# Patient Record
Sex: Male | Born: 1950 | Race: Black or African American | Hispanic: No | Smoking: Current every day smoker
Health system: Southern US, Community
[De-identification: ages and names within clinical notes are randomized; demographics above are authoritative.]

## PROBLEM LIST (undated history)

## (undated) DIAGNOSIS — K5790 Diverticulosis of intestine, part unspecified, without perforation or abscess without bleeding: Secondary | ICD-10-CM

## (undated) DIAGNOSIS — K648 Other hemorrhoids: Secondary | ICD-10-CM

## (undated) DIAGNOSIS — I739 Peripheral vascular disease, unspecified: Secondary | ICD-10-CM

## (undated) DIAGNOSIS — I10 Essential (primary) hypertension: Secondary | ICD-10-CM

## (undated) DIAGNOSIS — E785 Hyperlipidemia, unspecified: Secondary | ICD-10-CM

## (undated) HISTORY — DX: Peripheral vascular disease, unspecified: I73.9

## (undated) HISTORY — DX: Diverticulosis of intestine, part unspecified, without perforation or abscess without bleeding: K57.90

## (undated) HISTORY — PX: INGUINAL HERNIA REPAIR: SUR1180

## (undated) HISTORY — DX: Other hemorrhoids: K64.8

---

## 2003-04-25 ENCOUNTER — Ambulatory Visit (HOSPITAL_COMMUNITY): Admission: RE | Admit: 2003-04-25 | Discharge: 2003-04-25 | Payer: Self-pay | Admitting: Orthopedic Surgery

## 2003-05-15 ENCOUNTER — Ambulatory Visit (HOSPITAL_COMMUNITY): Admission: RE | Admit: 2003-05-15 | Discharge: 2003-05-15 | Payer: Self-pay | Admitting: Vascular Surgery

## 2003-05-30 ENCOUNTER — Inpatient Hospital Stay (HOSPITAL_COMMUNITY): Admission: RE | Admit: 2003-05-30 | Discharge: 2003-06-04 | Payer: Self-pay | Admitting: Vascular Surgery

## 2003-07-09 ENCOUNTER — Encounter: Admission: RE | Admit: 2003-07-09 | Discharge: 2003-08-28 | Payer: Self-pay | Admitting: Vascular Surgery

## 2006-06-14 ENCOUNTER — Ambulatory Visit: Payer: Self-pay | Admitting: Vascular Surgery

## 2007-12-08 ENCOUNTER — Ambulatory Visit: Payer: Self-pay | Admitting: Vascular Surgery

## 2010-08-05 NOTE — Procedures (Signed)
BYPASS GRAFT EVALUATION   INDICATION:  Follow up right lower extremity bypass graft, left lower  extremity gets tired; however, no pain.   HISTORY:  Diabetes:  No.  Cardiac:  No.  Hypertension:  Yes.  Smoking:  Yes.  Previous Surgery:  Right femoral-popliteal artery bypass graft in March,  2005 by Dr. Darrick Penna.   SINGLE LEVEL ARTERIAL EXAM                               RIGHT              LEFT  Brachial:                    132                133  Anterior tibial:             142                64  Posterior tibial:            140                68  Peroneal:  Ankle/brachial index:        1.05               0.51   PREVIOUS ABI:  Date: 06/14/06  RIGHT:  0.97  LEFT:  0.51   LOWER EXTREMITY BYPASS GRAFT DUPLEX EXAM:   DUPLEX:  1. Doppler arterial waveforms appear biphasic proximal to, within, and      distal to bypass graft.  2. Stable elevated velocities at proximal and distal anastomoses.   IMPRESSION:  1. Stable bilateral ankle brachial indices.  2. Patent right femoral-popliteal artery bypass graft.  3. Stable elevated velocities noted at the proximal and distal      anastomosis.  4. No significant changes from the previous study.   ___________________________________________  Janetta Hora Fields, MD   AS/MEDQ  D:  12/08/2007  T:  12/08/2007  Job:  [id number]

## 2010-08-08 NOTE — Op Note (Signed)
NAME:  Adrian Phillips, Adrian Phillips                   ACCOUNT NO.:  [id number]   MEDICAL RECORD NO.:  [id number]                   PATIENT TYPE:  INP   LOCATION:  2033                                 FACILITY:  [facility]   PHYSICIAN:  Janetta Hora. Fields, MD               DATE OF BIRTH:  27-Jun-1950   DATE OF PROCEDURE:  05/30/2003  DATE OF DISCHARGE:                                 OPERATIVE REPORT   PROCEDURE:  Right femoral-popliteal bypass with non-reversed greater  saphenous vein.   PREOPERATIVE DIAGNOSIS:  Rest pain, right foot.   POSTOPERATIVE DIAGNOSIS:  Rest pain, right foot.   ANESTHESIA:  General.   ASSISTANT:  Toribio Harbour, N.P.   INDICATIONS:  The patient is a 60 year old male with a history of smoking.  He now presents with an ABI of 0.4 and rest pain.   FINDINGS:  1. Good quality greater saphenous vein with diameter 3 mm and greater.  2. Non-reversed saphenous vein.   OPERATIVE DETAILS:  After obtaining informed consent, the patient was taken  to the operating room.  The patient was placed in supine position on the  operating room table.  After induction of general anesthesia and  endotracheal intubation, both of the patient's lower extremities were  prepped and draped in the usual sterile fashion.  A Foley catheter was  placed.  Next, a longitudinal incision was made in the right groin over the  area of the common femoral artery.  The common femoral artery was dissected  free circumferentially.  The superficial femoral and profunda femoris  arteries were then also dissected free circumferentially and controlled with  vessel loops.  Next, the saphenofemoral junction was located in the medial  portion of the incision, and the greater saphenous vein was dissected free  through skip incisions down the leg to approximately 5 cm below the knee.  All side branches were ligated between silk ties.  Next, the incision along  the medial aspect of the leg for the greater  saphenous vein was deepened  down to the level of the popliteal fossa.  The popliteal artery was  dissected free circumferentially for approximately 4 cm below the knee and  controlled proximally and distally with vessel loops.  The popliteal artery  was soft in character.  Next, a tunnel was created medial to the head of the  gastrocnemius muscle and deep to the sartorius muscle, and the patient was  then given 5000 units of intravenous heparin.  The greater saphenous vein  was then harvested by clamping the saphenofemoral junction and oversewing  this with a 5-0 Prolene.  The distal end of the saphenous vein was clipped  twice as a marker for distal saphenous vein in the future.  After the vein  was harvested, it was flushed thoroughly with heparinized saline and  inspected.  Two small side branches were ligated with silk ties.  The  caliber of the vein at the saphenofemoral junction  was a better match to the  femoral artery with the distal end of the greater saphenous vein being 3 mm  in diameter, and the proximal end being approximately 5 mm in diameter.  Next, the common femoral artery was clamped with a small angled DeBakey  clamp, and the vessel loops were pulled taut at the profunda and superficial  femoral arteries.  A longitudinal arteriotomy was made just above the  bifurcation of these arteries.  The vein was spatulated, and an end-of-vein  to side-of-artery anastomosis was then constructed using a running 6-0  Prolene suture.  The clamps were removed, and the anastomosis tested and  found to be hemostatic.  The artery was flushed down through the vein graft.  A valvulotome was then brought up in the operative field, and all the valves  were lysed carefully, and there was excellent inflow through the vein graft.  Next, the vein was brought through the deep tunnel down to the level of the  below-knee popliteal artery.  The vessel loops were pulled taut, and a  longitudinal  arteriotomy was made in the popliteal artery, and the vein  slightly spatulated, and end-of-vein to side-of-popliteal artery anastomosis  constructed using a running 6-0 Prolene suture.  Just prior to completion of  the anastomosis, the usual flushing procedures were performed.  The  anastomosis was then secured and the clamps released, first releasing the  proximal popliteal artery and then last the distal popliteal artery.  There  was noted to be a pulse in the graft and the distal popliteal artery  immediately.  Hemostasis was obtained.  Doppler inspection of the foot  showed a biphasic dorsalis pedis and posterior tibial signal.  Next, a 21-  gauge butterfly needle was introduced into the proximal portion of the vein  graft and a completion arteriogram performed using inflow occlusion.  This  showed the anastomosis to be widely patent with two-vessel runoff to the  foot;  namely by the posterior tibial and peroneal arteries.  Next, all  incisions were closed with a deep layer of 2-0 Vicryl running suture.  The  deeper incisions were closed with a second layer. The groin was also closed  in three layers.  The skin of all incisions was then closed with staples.  The patient tolerated the procedure well, and there were no complications.  The instrument, sponge, and needle count was correct at the end of the case.  The patient was taken to the recovery room in stable condition.                                               Janetta Hora. Fields, MD    CEF/MEDQ  D:  06/02/2003  T:  06/02/2003  Job:  [id number]

## 2010-08-08 NOTE — H&P (Signed)
NAME:  GJON, LETARTE                   ACCOUNT NO.:  [id number]   MEDICAL RECORD NO.:  [id number]                   PATIENT TYPE:  INP   LOCATION:  NA                                   FACILITY:  [facility]   PHYSICIAN:  Janetta Hora. Fields, MD               DATE OF BIRTH:  11-Nov-1950   DATE OF ADMISSION:  05/30/2003  DATE OF DISCHARGE:                                HISTORY & PHYSICAL   REFERRING PHYSICIAN:  Dyke Brackett, M.D.   CHIEF COMPLAINT:  Peripheral vascular occlusive disease.   HISTORY OF PRESENT ILLNESS:  This is a 60 year old male with complaints of  numbness and tingling in his right foot.  The patient notices the symptoms  more prominently at night, however, he does experience them during the  daytime as well.  His symptoms are relieved by dangling his foot over the  bed or walking.  The patient denies any recent trauma.  The patient does  experience claudication symptoms in both legs at long distances, however,  the right is worse than the left.  He denies any history of diabetes  mellitus, hypertension, hyperlipidemia, coronary artery disease, COPD, and  CHF.  The patient denies any slow-healing ulcers in the bilateral lower  extremities.  The patient also denies any weight changes, dyspnea on  exertion, shortness of breath, angina, GI symptoms, as well as TIA and CVA  symptoms.  ABI's were performed on April 25, 2003, by Dr. Darrick Penna; 0.45 on  the right and normal on the left.  Duplex examination revealed severe  atherosclerotic occlusive disease of the right SFA.  An angiogram was  performed on May 15, 2003, which also showed right superficial femoral  artery occlusion with two vessel runoff.  The patient presented to the [facility]  office today for history and physical examination prior to right femoral to  popliteal bypass grafting performed by Dr. Darrick Penna.   PAST MEDICAL HISTORY:  Peripheral vascular occlusive disease.   PAST SURGICAL HISTORY:  None.   MEDICATIONS:  None.   ALLERGIES:  No known drug allergies.   REVIEW OF SYSTEMS:  Please see HPI for significant positives and negatives.   FAMILY HISTORY:  Mother with cerebrovascular accidents, hypertension,  diabetes mellitus, end-stage renal disease.  Father with myocardial  infarction.   SOCIAL HISTORY:  This is a married male with two children.  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  The patient denies any alcohol use and is currently trying  to stop smoking cigarettes.  He is smoking approximately two cigarettes per  day now after smoking one pack per day for 30 years.   PHYSICAL EXAMINATION:  VITAL SIGNS:  Blood pressure 158/80, pulse 64,  respirations 14.  GENERAL:  This is a 60 year old black male in no acute distress.  He is  alert and oriented x3.  HEENT:  Normocephalic and atraumatic.  Pupils equal, round, and reactive to  light and accommodation.  Extraocular  movements are intact.  There are no  cataracts, glaucoma, or macular degeneration.  NECK:  Supple with no JVD, no bruits, and no lymphadenopathy.  CHEST:  Symmetrical on inspiration.  LUNGS:  No wheezes, rhonchi, or rales.  HEART:  Regular rate and rhythm.  No murmurs, rubs, or gallops.  ABDOMEN:  Soft, nontender, and nondistended.  No masses or bruits.  Bowel  sounds are present in four quadrants.  GENITOURINARY:  RECTAL:  Deferred.  EXTREMITIES:  No cyanosis, clubbing, or edema or ulcerations.  Temperature  is warm.  Pulses; carotid 2+ bilaterally, femoral 2+ bilaterally, popliteal  1+ on the left, and the pedal pulses are not palpable on the left.  Popliteal and pedal pulses are not palpable on the right.  NEUROLOGY:  Nonfocal.  Gait is steady.  Deep tendon reflexes are 2+ and  muscle strength is 5/5.   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   PLAN:  Right femoral to popliteal bypass graft at [facility] on  May 30, 2003, by Dr. Darrick Penna.   Dr. Darrick Penna has seen and evaluated the patient  prior to this admission and  has explained the risks and benefits of the procedure and the patient has  agreed to continue.      Pecola Leisure, PA                      Charles E. Darrick Penna, MD    AY/MEDQ  D:  05/29/2003  T:  05/29/2003  Job:  [id number]

## 2010-08-08 NOTE — Discharge Summary (Signed)
NAME:  Adrian Phillips, Adrian Phillips                   ACCOUNT NO.:  [id number]   MEDICAL RECORD NO.:  [id number]                   PATIENT TYPE:  INP   LOCATION:  2033                                 FACILITY:  [facility]   PHYSICIAN:  Janetta Hora. Fields, MD               DATE OF BIRTH:  [birth year]   DATE OF ADMISSION:  05/30/2003  DATE OF DISCHARGE:  06/02/2003                                 DISCHARGE SUMMARY   ADMISSION DIAGNOSIS:  Peripheral vascular occlusive disease with right  superficial femoral artery occlusion.   DISCHARGE DIAGNOSIS:  Peripheral vascular disease with right superficial  femoral artery occlusion, status post right femoral to below the knee  popliteal bypass.   PAST MEDICAL HISTORY:  Unremarkable.   PAST SURGICAL HISTORY:  None.   ALLERGIES:  No known drug allergies.   HISTORY OF PRESENT ILLNESS:  Adrian Phillips is a 59 year old African  American man.  He presented with complaints of numbness and tingling in his  right foot.  These symptoms are more prominent at night, however, he did  also experience them during the day.  His symptoms were relieved by dangling  his foot over the bed or walking.  He was evaluated at the CVTS office by  Dr. Darrick Penna on February 2, 20005.  The evaluation included ABIs which  revealed 0.45 on the right and normal on the left.  Duplex examination  revealed severe atherosclerotic occlusive disease of the right SFA.  Dr.  Darrick Penna recommended preceding with angiogram to determine his suitability for  revascularization.  This was performed on May 15, 2003.  This confirmed  right superficial femoral artery occlusion with two vessel runoff.  Dr.  Darrick Penna then recommended surgical intervention for revascularization of his  right lower extremity.  The procedure risks and benefits were discussed with  Adrian Phillips and he agreed to proceed with surgery.   HOSPITAL COURSE:  On May 22, 2003, Adrian Phillips was luckily admitted to  [facility] under the care of Dr. Fabienne Bruns.  He  underwent the following surgical procedures.  1. Right femoral to below the knee popliteal bypass with a nonreversed     saphenous vein graft.  2. Intraoperative arteriogram.  Adrian Phillips tolerated this procedure well, transferring in stable  condition to the PACU.  He had easily dopplerable signals at the posterior  tibial and peroneal areas postoperatively.  He was extubated immediately  following surgery, awoke from anesthesia neurologically intact.  He remained  hemodynamically stable the immediate postoperative period.   Postoperative ABIs were performed on May 31, 2003, and they showed an  increase in his ABI on the right from 0.45 to 0.85.   Adrian Phillips postoperative course has been essentially uneventful,  although slowed slightly due to increased swelling in his right leg today,  postoperative day #2.  His vital signs have remained stable with blood  pressure 112/68.  His room air saturations 98%.  His heart is maintaining  normal sinus rhythm.  His lungs are clear.  He is tolerating his diet  without nausea.  His bowel and bladder functions are within normal limits  for him.  His right leg incisions are clean and dry.  The staples are  intact.  He does have some significant edema in his right lower extremity.  Also, some specific complaints of pain in his right calf this morning.  For  this reason, a venous Doppler study was performed June 01, 2003.  There was  no evidence of DVT, superficial thrombosis or Baker's cyst.  Adrian Phillips  will continue to keep his leg elevated over the next 12 to 24 hours, being  out of bed only to use the bathroom.  If his overall progress continues and  his leg swelling reduces significantly, he will be discharged home in the  next 24 to 48 hours.   LABORATORY DATA:  June 01, 2003, CBC with white blood cells 12.9,  hemoglobin 10.9, hematocrit 33.2, platelets 205.   Chemistries include sodium  136, potassium 4.0, BUN 5, creatinine 1.1, glucose 113.   CONDITION ON DISCHARGE:  Improved.   DISCHARGE INSTRUCTIONS:  1. Medications:     a. Nicotine patch 21 mg.  He is instructed to change that daily. He is to        wear that 21 mg patch for six weeks, reduce to the 14 mg patch for two        weeks, then 7 mg patch for two weeks.  He has been counseled regarding        the importance of smoking cessation and he agrees that it is time for        him to stop smoking cigarettes.     b. For pain management, he may have Tylox one to two p.o. q.4h. p.r.n.        for moderate to severe pain or Tylenol 325 mg one to two p.o. q.4h.        for mild pain.  2. Activity:  He has been asked to refrain from any driving or heavy lifting     and no standing for long periods of time.  He is to continue his     breathing exercises and daily walking.  He has also been reminded to keep     his right leg elevated as much as possible even after discharge from the     hospital.  3. His diet should be a heart healthy diet.  4. Wound care:  He is to shower daily.  If his incisions show any sign of     infection or if he has fever greater than 101 degrees F., he is to call     Dr. Darrick Penna' office.   FOLLOW UP:  1. He will have an appointment to see the CVTS registered nurse for skin     staple removal on Friday, June 08, 2003.  2. Dr. Darrick Penna would like to see him at the CVTS office on Friday, June 22, 2003.  The office will call with times for these appointments.      Adrian Phillips, N.P.                  Janetta Hora. Fields, MD    CTK/MEDQ  D:  06/01/2003  T:  06/04/2003  Job:  [id number]   cc:   Thera Flake., M.D.  [address]  [place]  [place] [address]  Fax: [phone]

## 2010-08-08 NOTE — Op Note (Signed)
NAME:  Adrian Phillips, Adrian Phillips                   ACCOUNT NO.:  [id number]   MEDICAL RECORD NO.:  [id number]                   PATIENT TYPE:  OIB   LOCATION:  2899                                 FACILITY:  [facility]   PHYSICIAN:  Janetta Hora. Fields, MD               DATE OF BIRTH:  [birth year]   DATE OF PROCEDURE:  05/15/2003  DATE OF DISCHARGE:                                 OPERATIVE REPORT   PROCEDURE PERFORMED:  Aortogram with bilateral lower extremity runoff.   PREOPERATIVE DIAGNOSIS:  Claudication of right foot with early rest pain.   POSTOPERATIVE DIAGNOSIS:  Claudication of right foot with early rest pain.   SURGEON:  Janetta Hora. Fields, MD   ANESTHESIA:  Local anesthetic.   ASSISTANT:  Nurse.   INDICATIONS FOR PROCEDURE:  The patient has a history of right calf  claudication. He has now progressed to early rest pain in the right foot.   DESCRIPTION OF PROCEDURE:  After obtaining informed consent, the patient  brought to the peripheral vascular lab.  The patient was placed in supine  position on the angio table.  Next, both groins were prepped and draped in  the usual sterile fashion.  Local anesthesia was infiltrated over the right  common femoral artery.  A Majestic needle was then used to cannulate the  right common femoral artery and a 0.035 J-tip guidewire introduced into the  right common femoral artery and abdominal aorta under fluoroscopic guidance  using a modified Seldinger technique.  Next a 5 French sheath was placed  over the guidewire into the right common femoral artery.  A 5 French pigtail  catheter was then placed over the guidewire into the abdominal aorta.  The  abdominal aortogram was then performed which shows bilateral patent single  renal arteries.  There is approximately 10% stenosis of the mid right renal  artery.  The abdominal aorta has mild atherosclerotic changes.  The right  and left common iliac arteries were widely patent.  The left and right  internal iliac arteries were patent.  There was mild narrowing of  approximately 50% of the left internal iliac artery.  The left and right  internal iliac arteries were patent bilaterally.  The right and left common  femoral arteries were widely patent.  There is a 60% narrowing of the middle  third of the right profunda femoris artery.  Otherwise, the origin of the  profunda bilaterally and a superficial femoral artery bilaterally was widely  patent.  On the left side, there is a 50% narrowing of the superficial  femoral artery midway through its course.  On the right side the superficial  femoral artery occludes near the level of the adductor hiatus.  The above-  knee popliteal artery then reconstitutes just above the femoral condyle.  The left superficial femoral artery was in continuity throughout its course.  On the left side there is a small anterior tibial artery which is heavily  diseased at its origin.  The tibial peroneal trunk on the left side has  atherosclerotic changes in the take off of the posterior tibial and peroneal  arteries.  These arteries do opacify well, though.  On the right side, the  anterior tibial artery is occluded.  The tibioperoneal trunk is widely  patent.  On the right side the dominant vessel to the foot is the posterior  tibial artery.  The peroneal artery is small and diminutive throughout its  course.  It does give out anterior and posterior communicating branches  which fill the dorsalis pedis artery distally.  On the left side there is  two vessel runoff to the foot via the peroneal and posterior tibial artery.  After the aortogram and lower extremity runoff was performed, the pigtail  catheter was removed over the guidewire and the 5 French sheath pulled and  hemostasis obtained with direct pressure.  The patient tolerated the  procedure well and there were no complications.  The patient was taken to  the recovery room in stable condition.    IMPRESSION:  Bilateral superficial femoral artery disease with occluded  right superficial femoral artery and bilateral tibial artery occlusive  disease with two vessel runoff to the foot bilaterally.                                               Janetta Hora. Fields, MD    CEF/MEDQ  D:  05/15/2003  T:  05/15/2003  Job:  [id number]

## 2019-05-20 ENCOUNTER — Other Ambulatory Visit: Payer: Self-pay

## 2019-05-20 DIAGNOSIS — Z20822 Contact with and (suspected) exposure to covid-19: Secondary | ICD-10-CM

## 2019-05-22 ENCOUNTER — Ambulatory Visit: Payer: Self-pay | Attending: Internal Medicine

## 2019-05-22 DIAGNOSIS — Z23 Encounter for immunization: Secondary | ICD-10-CM | POA: Insufficient documentation

## 2019-05-22 LAB — NOVEL CORONAVIRUS, NAA: SARS-CoV-2, NAA: NOT DETECTED

## 2019-05-22 NOTE — Progress Notes (Signed)
   Covid-19 Vaccination Clinic  Name:  JAHLEEL STROSCHEIN    MRN: [id number] DOB: [birth year]  05/22/2019  Mr. Morini was observed post Covid-19 immunization for 15 minutes without incidence. He was provided with Vaccine Information Sheet and instruction to access the V-Safe system.   Mr. Suess was instructed to call 911 with any severe reactions post vaccine: Marland Kitchen Difficulty breathing  . Swelling of your face and throat  . A fast heartbeat  . A bad rash all over your body  . Dizziness and weakness    Immunizations Administered    Name Date Dose VIS Date Route   Pfizer COVID-19 Vaccine 05/22/2019  3:41 PM 0.3 mL 03/03/2019 Intramuscular   Manufacturer: [employer], [employer]   Lot: [id number]   NDC: [address]-9

## 2019-06-09 ENCOUNTER — Telehealth: Payer: Self-pay | Admitting: Gastroenterology

## 2019-06-09 NOTE — Telephone Encounter (Signed)
TRIAGE PT FOR SCREENING COLONOSCOPY PRIOR TO MAY 8 IF POSSIBLE.

## 2019-06-20 ENCOUNTER — Ambulatory Visit: Payer: Self-pay | Attending: Internal Medicine

## 2019-06-20 ENCOUNTER — Other Ambulatory Visit: Payer: Self-pay

## 2019-06-20 ENCOUNTER — Ambulatory Visit: Payer: BC Managed Care – PPO

## 2019-06-20 DIAGNOSIS — Z23 Encounter for immunization: Secondary | ICD-10-CM

## 2019-06-20 NOTE — Progress Notes (Signed)
   Covid-19 Vaccination Clinic  Name:  Adrian Phillips    MRN: [id number] DOB: [birth year]  06/20/2019  Mr. Borunda was observed post Covid-19 immunization for 15 minutes without incident. He was provided with Vaccine Information Sheet and instruction to access the V-Safe system.   Mr. Basnett was instructed to call 911 with any severe reactions post vaccine: Marland Kitchen Difficulty breathing  . Swelling of face and throat  . A fast heartbeat  . A bad rash all over body  . Dizziness and weakness   Immunizations Administered    Name Date Dose VIS Date Route   Pfizer COVID-19 Vaccine 06/20/2019  3:26 PM 0.3 mL 03/03/2019 Intramuscular   Manufacturer: [employer], [employer]   Lot: [id number]   NDC: [address]-7

## 2019-06-20 NOTE — Telephone Encounter (Signed)
REVIEWED-NO ADDITIONAL RECOMMENDATIONS. 

## 2019-06-20 NOTE — Telephone Encounter (Signed)
Pt had nurse triage visit scheduled for today.  Called pt to triage him and he informed me that the [place] had reached out to him this morning and scheduled him for a TCS.  Cancelled our triage visit and informed pt that I would inform [facility].

## 2019-08-04 ENCOUNTER — Encounter: Payer: Self-pay | Admitting: Physician Assistant

## 2019-08-28 DIAGNOSIS — R195 Other fecal abnormalities: Secondary | ICD-10-CM | POA: Insufficient documentation

## 2019-08-28 DIAGNOSIS — I10 Essential (primary) hypertension: Secondary | ICD-10-CM | POA: Insufficient documentation

## 2019-08-29 ENCOUNTER — Ambulatory Visit: Payer: BC Managed Care – PPO | Admitting: Physician Assistant

## 2019-09-12 ENCOUNTER — Ambulatory Visit: Payer: BC Managed Care – PPO | Admitting: Physician Assistant

## 2019-10-16 ENCOUNTER — Encounter: Payer: Self-pay | Admitting: Nurse Practitioner

## 2019-10-17 ENCOUNTER — Ambulatory Visit: Payer: BC Managed Care – PPO | Admitting: Gastroenterology

## 2019-11-28 ENCOUNTER — Ambulatory Visit: Payer: BC Managed Care – PPO | Admitting: Nurse Practitioner

## 2019-12-05 ENCOUNTER — Other Ambulatory Visit: Payer: Self-pay

## 2019-12-05 ENCOUNTER — Emergency Department (HOSPITAL_COMMUNITY): Payer: BC Managed Care – PPO

## 2019-12-05 ENCOUNTER — Encounter (HOSPITAL_COMMUNITY): Payer: Self-pay | Admitting: Emergency Medicine

## 2019-12-05 ENCOUNTER — Emergency Department (HOSPITAL_COMMUNITY)
Admission: EM | Admit: 2019-12-05 | Discharge: 2019-12-05 | Disposition: A | Discharge status: Home / Self Care | Payer: BC Managed Care – PPO | Attending: Emergency Medicine | Admitting: Emergency Medicine

## 2019-12-05 DIAGNOSIS — Z5321 Procedure and treatment not carried out due to patient leaving prior to being seen by health care provider: Secondary | ICD-10-CM | POA: Insufficient documentation

## 2019-12-05 DIAGNOSIS — M7989 Other specified soft tissue disorders: Secondary | ICD-10-CM | POA: Diagnosis present

## 2019-12-05 HISTORY — DX: Essential (primary) hypertension: I10

## 2019-12-05 HISTORY — DX: Hyperlipidemia, unspecified: E78.5

## 2019-12-05 LAB — CBC
HCT: 44.5 % (ref 39.0–52.0)
Hemoglobin: 14.2 g/dL (ref 13.0–17.0)
MCH: 28.4 pg (ref 26.0–34.0)
MCHC: 31.9 g/dL (ref 30.0–36.0)
MCV: 89 fL (ref 80.0–100.0)
Platelets: 274 10*3/uL (ref 150–400)
RBC: 5 MIL/uL (ref 4.22–5.81)
RDW: 15.4 % (ref 11.5–15.5)
WBC: 6.5 10*3/uL (ref 4.0–10.5)
nRBC: 0 % (ref 0.0–0.2)

## 2019-12-05 LAB — BASIC METABOLIC PANEL
Anion gap: 11 (ref 5–15)
BUN: 6 mg/dL — ABNORMAL LOW (ref 8–23)
CO2: 23 mmol/L (ref 22–32)
Calcium: 8.8 mg/dL — ABNORMAL LOW (ref 8.9–10.3)
Chloride: 109 mmol/L (ref 98–111)
Creatinine, Ser: 0.84 mg/dL (ref 0.61–1.24)
GFR calc Af Amer: >60 mL/min (ref >60)
GFR calc non Af Amer: >60 mL/min (ref >60)
Glucose, Bld: 94 mg/dL (ref 70–99)
Potassium: 4.1 mmol/L (ref 3.5–5.1)
Sodium: 143 mmol/L (ref 135–145)

## 2019-12-05 LAB — TROPONIN I (HIGH SENSITIVITY): Troponin I (High Sensitivity): 11 ng/L (ref <18)

## 2019-12-05 NOTE — ED Triage Notes (Addendum)
Per pt, states both his ankle are swollen-non pitting-denies CP, SOB-no other symptoms-patient is currently taking Norvasc

## 2020-01-01 ENCOUNTER — Emergency Department (HOSPITAL_COMMUNITY): Payer: No Typology Code available for payment source

## 2020-01-01 ENCOUNTER — Encounter (HOSPITAL_COMMUNITY): Payer: Self-pay

## 2020-01-01 ENCOUNTER — Other Ambulatory Visit: Payer: Self-pay

## 2020-01-01 ENCOUNTER — Inpatient Hospital Stay (HOSPITAL_COMMUNITY): Payer: No Typology Code available for payment source

## 2020-01-01 ENCOUNTER — Inpatient Hospital Stay (HOSPITAL_COMMUNITY)
Admission: EM | Admit: 2020-01-01 | Discharge: 2020-01-22 | DRG: 215 | Disposition: E | Payer: No Typology Code available for payment source | Attending: Cardiothoracic Surgery | Admitting: Cardiothoracic Surgery

## 2020-01-01 ENCOUNTER — Ambulatory Visit: Payer: BC Managed Care – PPO | Admitting: Nurse Practitioner

## 2020-01-01 DIAGNOSIS — A419 Sepsis, unspecified organism: Secondary | ICD-10-CM | POA: Diagnosis not present

## 2020-01-01 DIAGNOSIS — E785 Hyperlipidemia, unspecified: Secondary | ICD-10-CM | POA: Diagnosis present

## 2020-01-01 DIAGNOSIS — J438 Other emphysema: Secondary | ICD-10-CM | POA: Diagnosis present

## 2020-01-01 DIAGNOSIS — Z87892 Personal history of anaphylaxis: Secondary | ICD-10-CM

## 2020-01-01 DIAGNOSIS — Z95811 Presence of heart assist device: Secondary | ICD-10-CM | POA: Diagnosis not present

## 2020-01-01 DIAGNOSIS — F419 Anxiety disorder, unspecified: Secondary | ICD-10-CM | POA: Diagnosis not present

## 2020-01-01 DIAGNOSIS — T8089XA Other complications following infusion, transfusion and therapeutic injection, initial encounter: Secondary | ICD-10-CM | POA: Diagnosis not present

## 2020-01-01 DIAGNOSIS — I5021 Acute systolic (congestive) heart failure: Secondary | ICD-10-CM

## 2020-01-01 DIAGNOSIS — L03114 Cellulitis of left upper limb: Secondary | ICD-10-CM | POA: Diagnosis not present

## 2020-01-01 DIAGNOSIS — I25110 Atherosclerotic heart disease of native coronary artery with unstable angina pectoris: Secondary | ICD-10-CM | POA: Diagnosis not present

## 2020-01-01 DIAGNOSIS — I110 Hypertensive heart disease with heart failure: Secondary | ICD-10-CM | POA: Diagnosis present

## 2020-01-01 DIAGNOSIS — I25750 Atherosclerosis of native coronary artery of transplanted heart with unstable angina: Secondary | ICD-10-CM

## 2020-01-01 DIAGNOSIS — J942 Hemothorax: Secondary | ICD-10-CM | POA: Diagnosis not present

## 2020-01-01 DIAGNOSIS — T8111XA Postprocedural  cardiogenic shock, initial encounter: Secondary | ICD-10-CM | POA: Diagnosis not present

## 2020-01-01 DIAGNOSIS — Z4682 Encounter for fitting and adjustment of non-vascular catheter: Secondary | ICD-10-CM

## 2020-01-01 DIAGNOSIS — I6521 Occlusion and stenosis of right carotid artery: Secondary | ICD-10-CM | POA: Diagnosis present

## 2020-01-01 DIAGNOSIS — I509 Heart failure, unspecified: Secondary | ICD-10-CM

## 2020-01-01 DIAGNOSIS — E874 Mixed disorder of acid-base balance: Secondary | ICD-10-CM | POA: Diagnosis not present

## 2020-01-01 DIAGNOSIS — I214 Non-ST elevation (NSTEMI) myocardial infarction: Secondary | ICD-10-CM | POA: Diagnosis present

## 2020-01-01 DIAGNOSIS — E861 Hypovolemia: Secondary | ICD-10-CM | POA: Diagnosis not present

## 2020-01-01 DIAGNOSIS — J432 Centrilobular emphysema: Secondary | ICD-10-CM | POA: Diagnosis present

## 2020-01-01 DIAGNOSIS — Y838 Other surgical procedures as the cause of abnormal reaction of the patient, or of later complication, without mention of misadventure at the time of the procedure: Secondary | ICD-10-CM | POA: Diagnosis not present

## 2020-01-01 DIAGNOSIS — R008 Other abnormalities of heart beat: Secondary | ICD-10-CM | POA: Diagnosis not present

## 2020-01-01 DIAGNOSIS — Y848 Other medical procedures as the cause of abnormal reaction of the patient, or of later complication, without mention of misadventure at the time of the procedure: Secondary | ICD-10-CM | POA: Diagnosis not present

## 2020-01-01 DIAGNOSIS — I428 Other cardiomyopathies: Secondary | ICD-10-CM | POA: Diagnosis present

## 2020-01-01 DIAGNOSIS — J810 Acute pulmonary edema: Secondary | ICD-10-CM

## 2020-01-01 DIAGNOSIS — I088 Other rheumatic multiple valve diseases: Secondary | ICD-10-CM | POA: Diagnosis present

## 2020-01-01 DIAGNOSIS — I2510 Atherosclerotic heart disease of native coronary artery without angina pectoris: Secondary | ICD-10-CM

## 2020-01-01 DIAGNOSIS — I739 Peripheral vascular disease, unspecified: Secondary | ICD-10-CM | POA: Diagnosis present

## 2020-01-01 DIAGNOSIS — I5043 Acute on chronic combined systolic (congestive) and diastolic (congestive) heart failure: Secondary | ICD-10-CM | POA: Diagnosis not present

## 2020-01-01 DIAGNOSIS — I161 Hypertensive emergency: Secondary | ICD-10-CM | POA: Diagnosis present

## 2020-01-01 DIAGNOSIS — J9601 Acute respiratory failure with hypoxia: Secondary | ICD-10-CM | POA: Diagnosis not present

## 2020-01-01 DIAGNOSIS — I340 Nonrheumatic mitral (valve) insufficiency: Secondary | ICD-10-CM

## 2020-01-01 DIAGNOSIS — K7200 Acute and subacute hepatic failure without coma: Secondary | ICD-10-CM | POA: Diagnosis not present

## 2020-01-01 DIAGNOSIS — Z79899 Other long term (current) drug therapy: Secondary | ICD-10-CM

## 2020-01-01 DIAGNOSIS — D689 Coagulation defect, unspecified: Secondary | ICD-10-CM | POA: Diagnosis not present

## 2020-01-01 DIAGNOSIS — Z01810 Encounter for preprocedural cardiovascular examination: Secondary | ICD-10-CM | POA: Diagnosis not present

## 2020-01-01 DIAGNOSIS — Z720 Tobacco use: Secondary | ICD-10-CM | POA: Diagnosis present

## 2020-01-01 DIAGNOSIS — Z515 Encounter for palliative care: Secondary | ICD-10-CM

## 2020-01-01 DIAGNOSIS — Z7982 Long term (current) use of aspirin: Secondary | ICD-10-CM

## 2020-01-01 DIAGNOSIS — F1010 Alcohol abuse, uncomplicated: Secondary | ICD-10-CM | POA: Diagnosis present

## 2020-01-01 DIAGNOSIS — Z419 Encounter for procedure for purposes other than remedying health state, unspecified: Secondary | ICD-10-CM

## 2020-01-01 DIAGNOSIS — I9789 Other postprocedural complications and disorders of the circulatory system, not elsewhere classified: Secondary | ICD-10-CM | POA: Diagnosis not present

## 2020-01-01 DIAGNOSIS — L8990 Pressure ulcer of unspecified site, unspecified stage: Secondary | ICD-10-CM | POA: Insufficient documentation

## 2020-01-01 DIAGNOSIS — R570 Cardiogenic shock: Secondary | ICD-10-CM | POA: Diagnosis not present

## 2020-01-01 DIAGNOSIS — I472 Ventricular tachycardia: Secondary | ICD-10-CM | POA: Diagnosis present

## 2020-01-01 DIAGNOSIS — I82890 Acute embolism and thrombosis of other specified veins: Secondary | ICD-10-CM | POA: Diagnosis not present

## 2020-01-01 DIAGNOSIS — D62 Acute posthemorrhagic anemia: Secondary | ICD-10-CM | POA: Diagnosis not present

## 2020-01-01 DIAGNOSIS — D696 Thrombocytopenia, unspecified: Secondary | ICD-10-CM | POA: Diagnosis not present

## 2020-01-01 DIAGNOSIS — I700 Atherosclerosis of aorta: Secondary | ICD-10-CM | POA: Diagnosis present

## 2020-01-01 DIAGNOSIS — E162 Hypoglycemia, unspecified: Secondary | ICD-10-CM | POA: Diagnosis not present

## 2020-01-01 DIAGNOSIS — I809 Phlebitis and thrombophlebitis of unspecified site: Secondary | ICD-10-CM | POA: Diagnosis not present

## 2020-01-01 DIAGNOSIS — N179 Acute kidney failure, unspecified: Secondary | ICD-10-CM | POA: Diagnosis not present

## 2020-01-01 DIAGNOSIS — Z7902 Long term (current) use of antithrombotics/antiplatelets: Secondary | ICD-10-CM

## 2020-01-01 DIAGNOSIS — I160 Hypertensive urgency: Secondary | ICD-10-CM | POA: Diagnosis present

## 2020-01-01 DIAGNOSIS — I469 Cardiac arrest, cause unspecified: Secondary | ICD-10-CM | POA: Diagnosis not present

## 2020-01-01 DIAGNOSIS — I462 Cardiac arrest due to underlying cardiac condition: Secondary | ICD-10-CM | POA: Diagnosis not present

## 2020-01-01 DIAGNOSIS — F17210 Nicotine dependence, cigarettes, uncomplicated: Secondary | ICD-10-CM | POA: Diagnosis present

## 2020-01-01 DIAGNOSIS — I493 Ventricular premature depolarization: Secondary | ICD-10-CM | POA: Diagnosis present

## 2020-01-01 DIAGNOSIS — Z66 Do not resuscitate: Secondary | ICD-10-CM | POA: Diagnosis not present

## 2020-01-01 DIAGNOSIS — Z9689 Presence of other specified functional implants: Secondary | ICD-10-CM

## 2020-01-01 DIAGNOSIS — Z951 Presence of aortocoronary bypass graft: Secondary | ICD-10-CM

## 2020-01-01 DIAGNOSIS — I255 Ischemic cardiomyopathy: Secondary | ICD-10-CM | POA: Diagnosis present

## 2020-01-01 DIAGNOSIS — J441 Chronic obstructive pulmonary disease with (acute) exacerbation: Secondary | ICD-10-CM | POA: Diagnosis not present

## 2020-01-01 DIAGNOSIS — Z20822 Contact with and (suspected) exposure to covid-19: Secondary | ICD-10-CM | POA: Diagnosis present

## 2020-01-01 DIAGNOSIS — Z01818 Encounter for other preprocedural examination: Secondary | ICD-10-CM

## 2020-01-01 DIAGNOSIS — R6521 Severe sepsis with septic shock: Secondary | ICD-10-CM | POA: Diagnosis not present

## 2020-01-01 DIAGNOSIS — E875 Hyperkalemia: Secondary | ICD-10-CM | POA: Diagnosis not present

## 2020-01-01 DIAGNOSIS — I2722 Pulmonary hypertension due to left heart disease: Secondary | ICD-10-CM | POA: Diagnosis present

## 2020-01-01 DIAGNOSIS — R739 Hyperglycemia, unspecified: Secondary | ICD-10-CM | POA: Diagnosis present

## 2020-01-01 DIAGNOSIS — I2584 Coronary atherosclerosis due to calcified coronary lesion: Secondary | ICD-10-CM | POA: Diagnosis present

## 2020-01-01 LAB — BASIC METABOLIC PANEL
Anion gap: 10 (ref 5–15)
Anion gap: 11 (ref 5–15)
BUN: 12 mg/dL (ref 8–23)
BUN: 18 mg/dL (ref 8–23)
CO2: 24 mmol/L (ref 22–32)
CO2: 26 mmol/L (ref 22–32)
Calcium: 8.9 mg/dL (ref 8.9–10.3)
Calcium: 9.2 mg/dL (ref 8.9–10.3)
Chloride: 106 mmol/L (ref 98–111)
Chloride: 101 mmol/L (ref 98–111)
Creatinine, Ser: 0.9 mg/dL (ref 0.61–1.24)
Creatinine, Ser: 1.14 mg/dL (ref 0.61–1.24)
GFR, Estimated: >60 mL/min (ref >60)
GFR, Estimated: >60 mL/min (ref >60)
Glucose, Bld: 174 mg/dL — ABNORMAL HIGH (ref 70–99)
Glucose, Bld: 157 mg/dL — ABNORMAL HIGH (ref 70–99)
Potassium: 3.5 mmol/L (ref 3.5–5.1)
Potassium: 4.2 mmol/L (ref 3.5–5.1)
Sodium: 140 mmol/L (ref 135–145)
Sodium: 138 mmol/L (ref 135–145)

## 2020-01-01 LAB — COMPREHENSIVE METABOLIC PANEL
ALT: 37 U/L (ref 0–44)
AST: 72 U/L — ABNORMAL HIGH (ref 15–41)
Albumin: 3.6 g/dL (ref 3.5–5.0)
Alkaline Phosphatase: 99 U/L (ref 38–126)
Anion gap: 10 (ref 5–15)
BUN: 15 mg/dL (ref 8–23)
CO2: 23 mmol/L (ref 22–32)
Calcium: 8.4 mg/dL — ABNORMAL LOW (ref 8.9–10.3)
Chloride: 107 mmol/L (ref 98–111)
Creatinine, Ser: 0.9 mg/dL (ref 0.61–1.24)
GFR, Estimated: >60 mL/min (ref >60)
Glucose, Bld: 136 mg/dL — ABNORMAL HIGH (ref 70–99)
Potassium: 5.1 mmol/L (ref 3.5–5.1)
Sodium: 140 mmol/L (ref 135–145)
Total Bilirubin: 0.5 mg/dL (ref 0.3–1.2)
Total Protein: 6.6 g/dL (ref 6.5–8.1)

## 2020-01-01 LAB — MAGNESIUM
Magnesium: 2.2 mg/dL (ref 1.7–2.4)
Magnesium: 2.2 mg/dL (ref 1.7–2.4)

## 2020-01-01 LAB — CBC WITH DIFFERENTIAL/PLATELET
Abs Immature Granulocytes: 0.09 10*3/uL — ABNORMAL HIGH (ref 0.00–0.07)
Abs Immature Granulocytes: 0.06 10*3/uL (ref 0.00–0.07)
Basophils Absolute: 0.1 10*3/uL (ref 0.0–0.1)
Basophils Absolute: 0 10*3/uL (ref 0.0–0.1)
Basophils Relative: 0 %
Basophils Relative: 0 %
Eosinophils Absolute: 0 10*3/uL (ref 0.0–0.5)
Eosinophils Absolute: 0 10*3/uL (ref 0.0–0.5)
Eosinophils Relative: 0 %
Eosinophils Relative: 0 %
HCT: 51.1 % (ref 39.0–52.0)
HCT: 42.2 % (ref 39.0–52.0)
Hemoglobin: 16 g/dL (ref 13.0–17.0)
Hemoglobin: 12.7 g/dL — ABNORMAL LOW (ref 13.0–17.0)
Immature Granulocytes: 1 %
Immature Granulocytes: 1 %
Lymphocytes Relative: 11 %
Lymphocytes Relative: 2 %
Lymphs Abs: 2.1 10*3/uL (ref 0.7–4.0)
Lymphs Abs: 0.3 10*3/uL — ABNORMAL LOW (ref 0.7–4.0)
MCH: 29.4 pg (ref 26.0–34.0)
MCH: 28.1 pg (ref 26.0–34.0)
MCHC: 31.3 g/dL (ref 30.0–36.0)
MCHC: 30.1 g/dL (ref 30.0–36.0)
MCV: 93.9 fL (ref 80.0–100.0)
MCV: 93.4 fL (ref 80.0–100.0)
Monocytes Absolute: 1.5 10*3/uL — ABNORMAL HIGH (ref 0.1–1.0)
Monocytes Absolute: 0.4 10*3/uL (ref 0.1–1.0)
Monocytes Relative: 8 %
Monocytes Relative: 3 %
Neutro Abs: 15.5 10*3/uL — ABNORMAL HIGH (ref 1.7–7.7)
Neutro Abs: 11.4 10*3/uL — ABNORMAL HIGH (ref 1.7–7.7)
Neutrophils Relative %: 80 %
Neutrophils Relative %: 94 %
Platelets: 273 10*3/uL (ref 150–400)
Platelets: 207 10*3/uL (ref 150–400)
RBC: 5.44 MIL/uL (ref 4.22–5.81)
RBC: 4.52 MIL/uL (ref 4.22–5.81)
RDW: 14.8 % (ref 11.5–15.5)
RDW: 14.6 % (ref 11.5–15.5)
WBC: 19.2 10*3/uL — ABNORMAL HIGH (ref 4.0–10.5)
WBC: 12.1 10*3/uL — ABNORMAL HIGH (ref 4.0–10.5)
nRBC: 0 % (ref 0.0–0.2)
nRBC: 0 % (ref 0.0–0.2)

## 2020-01-01 LAB — TSH: TSH: 0.56 u[IU]/mL (ref 0.350–4.500)

## 2020-01-01 LAB — RESPIRATORY PANEL BY RT PCR (FLU A&B, COVID)
Influenza A by PCR: NEGATIVE
Influenza B by PCR: NEGATIVE
SARS Coronavirus 2 by RT PCR: NEGATIVE

## 2020-01-01 LAB — LIPID PANEL
Cholesterol: 144 mg/dL (ref 0–200)
HDL: 82 mg/dL (ref >40)
LDL Cholesterol: 57 mg/dL (ref 0–99)
Total CHOL/HDL Ratio: 1.8 RATIO
Triglycerides: 26 mg/dL (ref <150)
VLDL: 5 mg/dL (ref 0–40)

## 2020-01-01 LAB — BRAIN NATRIURETIC PEPTIDE: B Natriuretic Peptide: 269.4 pg/mL — ABNORMAL HIGH (ref 0.0–100.0)

## 2020-01-01 LAB — ECHOCARDIOGRAM COMPLETE
Area-P 1/2: 3.91 cm2
Calc EF: 12.6 %
Height: 70 in
MV M vel: 4.33 m/s
MV Peak grad: 75 mmHg
Radius: 0.4 cm
S' Lateral: 5.1 cm
Single Plane A2C EF: 13.2 %
Single Plane A4C EF: 13.8 %
Weight: 2384 oz

## 2020-01-01 LAB — TROPONIN I (HIGH SENSITIVITY)
Troponin I (High Sensitivity): 1187 ng/L (ref <18)
Troponin I (High Sensitivity): 4615 ng/L (ref <18)

## 2020-01-01 LAB — RAPID URINE DRUG SCREEN, HOSP PERFORMED
Amphetamines: NOT DETECTED
Barbiturates: NOT DETECTED
Benzodiazepines: NOT DETECTED
Cocaine: NOT DETECTED
Opiates: NOT DETECTED
Tetrahydrocannabinol: NOT DETECTED

## 2020-01-01 LAB — D-DIMER, QUANTITATIVE: D-Dimer, Quant: 0.94 ug/mL-FEU — ABNORMAL HIGH (ref 0.00–0.50)

## 2020-01-01 LAB — HIV ANTIBODY (ROUTINE TESTING W REFLEX): HIV Screen 4th Generation wRfx: NONREACTIVE

## 2020-01-01 LAB — MRSA PCR SCREENING: MRSA by PCR: NEGATIVE

## 2020-01-01 LAB — HEMOGLOBIN A1C
Hgb A1c MFr Bld: 5.4 % (ref 4.8–5.6)
Mean Plasma Glucose: 108.28 mg/dL

## 2020-01-01 LAB — HEPARIN LEVEL (UNFRACTIONATED): Heparin Unfractionated: 0.1 IU/mL — ABNORMAL LOW (ref 0.30–0.70)

## 2020-01-01 MED ORDER — LORAZEPAM 1 MG PO TABS: 1.0000 mg | ORAL_TABLET | ORAL | Status: DC | PRN

## 2020-01-01 MED ORDER — ORAL CARE MOUTH RINSE
15.0000 mL | Freq: Two times a day (BID) | OROMUCOSAL | Status: DC
  Administered 2020-01-03 – 2020-01-07 (×6): 15 mL via OROMUCOSAL

## 2020-01-01 MED ORDER — SODIUM CHLORIDE 0.9 % IV SOLN
INTRAVENOUS | Status: DC | PRN
  Administered 2020-01-01: 250 mL via INTRAVENOUS

## 2020-01-01 MED ORDER — INFLUENZA VAC A&B SA ADJ QUAD 0.5 ML IM PRSY
0.5000 mL | PREFILLED_SYRINGE | INTRAMUSCULAR | Status: DC
  Filled 2020-01-01: qty 0.5

## 2020-01-01 MED ORDER — HEPARIN BOLUS VIA INFUSION
4000.0000 [IU] | Freq: Once | INTRAVENOUS | Status: AC
  Administered 2020-01-01: 4000 [IU] via INTRAVENOUS
  Filled 2020-01-01: qty 4000

## 2020-01-01 MED ORDER — HYDRALAZINE HCL 10 MG PO TABS: 10.0000 mg | ORAL_TABLET | Freq: Three times a day (TID) | ORAL | Status: DC

## 2020-01-01 MED ORDER — FUROSEMIDE 10 MG/ML IJ SOLN
80.0000 mg | Freq: Once | INTRAMUSCULAR | Status: AC
  Administered 2020-01-01: 80 mg via INTRAVENOUS
  Filled 2020-01-01: qty 8

## 2020-01-01 MED ORDER — NITROGLYCERIN IN D5W 200-5 MCG/ML-% IV SOLN
0.0000 ug/min | INTRAVENOUS | Status: DC
  Administered 2020-01-01: 5 ug/min via INTRAVENOUS
  Filled 2020-01-01: qty 250

## 2020-01-01 MED ORDER — LORAZEPAM 2 MG/ML IJ SOLN: 1.0000 mg | INTRAMUSCULAR | Status: DC | PRN

## 2020-01-01 MED ORDER — ENOXAPARIN SODIUM 40 MG/0.4ML ~~LOC~~ SOLN: 40.0000 mg | SUBCUTANEOUS | Status: DC

## 2020-01-01 MED ORDER — CHLORHEXIDINE GLUCONATE CLOTH 2 % EX PADS
6.0000 | MEDICATED_PAD | Freq: Every day | CUTANEOUS | Status: DC
  Administered 2020-01-01 – 2020-01-07 (×6): 6 via TOPICAL

## 2020-01-01 MED ORDER — CLOPIDOGREL BISULFATE 75 MG PO TABS
75.0000 mg | ORAL_TABLET | Freq: Every day | ORAL | Status: DC
  Administered 2020-01-01 – 2020-01-02 (×2): 75 mg via ORAL
  Filled 2020-01-01 (×3): qty 1

## 2020-01-01 MED ORDER — ASPIRIN 325 MG PO TABS
325.0000 mg | ORAL_TABLET | Freq: Once | ORAL | Status: AC
  Administered 2020-01-01: 325 mg via ORAL
  Filled 2020-01-01: qty 1

## 2020-01-01 MED ORDER — PERFLUTREN LIPID MICROSPHERE
1.0000 mL | INTRAVENOUS | Status: AC | PRN
  Administered 2020-01-01: 2 mL via INTRAVENOUS
  Filled 2020-01-01: qty 10

## 2020-01-01 MED ORDER — THIAMINE HCL 100 MG PO TABS
100.0000 mg | ORAL_TABLET | Freq: Every day | ORAL | Status: DC
  Administered 2020-01-01 – 2020-01-12 (×7): 100 mg via ORAL
  Filled 2020-01-01 (×9): qty 1

## 2020-01-01 MED ORDER — SODIUM CHLORIDE 0.9 % IV SOLN: INTRAVENOUS | Status: DC

## 2020-01-01 MED ORDER — ACETAMINOPHEN 650 MG RE SUPP: 650.0000 mg | Freq: Four times a day (QID) | RECTAL | Status: DC | PRN

## 2020-01-01 MED ORDER — ACETAMINOPHEN 325 MG PO TABS: 650.0000 mg | ORAL_TABLET | Freq: Four times a day (QID) | ORAL | Status: DC | PRN

## 2020-01-01 MED ORDER — AMLODIPINE BESYLATE 5 MG PO TABS: 10.0000 mg | ORAL_TABLET | Freq: Every day | ORAL | Status: DC

## 2020-01-01 MED ORDER — FUROSEMIDE 10 MG/ML IJ SOLN
40.0000 mg | Freq: Two times a day (BID) | INTRAMUSCULAR | Status: DC
  Administered 2020-01-01 – 2020-01-02 (×4): 40 mg via INTRAVENOUS
  Filled 2020-01-01 (×4): qty 4

## 2020-01-01 MED ORDER — ADULT MULTIVITAMIN W/MINERALS CH
1.0000 | ORAL_TABLET | Freq: Every day | ORAL | Status: DC
  Administered 2020-01-01 – 2020-01-07 (×6): 1 via ORAL
  Filled 2020-01-01 (×6): qty 1

## 2020-01-01 MED ORDER — SODIUM CHLORIDE 0.9 % IV SOLN: 250.0000 mL | INTRAVENOUS | Status: DC | PRN

## 2020-01-01 MED ORDER — CHLORHEXIDINE GLUCONATE 0.12% ORAL RINSE (MEDLINE KIT)
15.0000 mL | Freq: Two times a day (BID) | OROMUCOSAL | Status: DC
  Administered 2020-01-01 – 2020-01-07 (×10): 15 mL via OROMUCOSAL

## 2020-01-01 MED ORDER — SODIUM CHLORIDE 0.9% FLUSH
3.0000 mL | Freq: Two times a day (BID) | INTRAVENOUS | Status: DC
  Administered 2020-01-01 – 2020-01-07 (×6): 3 mL via INTRAVENOUS

## 2020-01-01 MED ORDER — LISINOPRIL 10 MG PO TABS: 10.0000 mg | ORAL_TABLET | Freq: Every day | ORAL | Status: DC

## 2020-01-01 MED ORDER — HEPARIN (PORCINE) 25000 UT/250ML-% IV SOLN
1000.0000 [IU]/h | INTRAVENOUS | Status: DC
  Administered 2020-01-01: 800 [IU]/h via INTRAVENOUS
  Filled 2020-01-01 (×2): qty 250

## 2020-01-01 MED ORDER — ASPIRIN EC 81 MG PO TBEC
81.0000 mg | DELAYED_RELEASE_TABLET | Freq: Every day | ORAL | Status: DC
  Administered 2020-01-03 – 2020-01-07 (×5): 81 mg via ORAL
  Filled 2020-01-01 (×6): qty 1

## 2020-01-01 MED ORDER — ATORVASTATIN CALCIUM 40 MG PO TABS: 40.0000 mg | ORAL_TABLET | Freq: Every day | ORAL | Status: DC

## 2020-01-01 MED ORDER — ATORVASTATIN CALCIUM 80 MG PO TABS
80.0000 mg | ORAL_TABLET | Freq: Every day | ORAL | Status: DC
  Administered 2020-01-01 – 2020-01-10 (×8): 80 mg via ORAL
  Filled 2020-01-01: qty 2
  Filled 2020-01-01 (×7): qty 1

## 2020-01-01 MED ORDER — THIAMINE HCL 100 MG/ML IJ SOLN
100.0000 mg | Freq: Every day | INTRAMUSCULAR | Status: DC
  Administered 2020-01-04 – 2020-01-09 (×2): 100 mg via INTRAVENOUS
  Filled 2020-01-01 (×4): qty 2

## 2020-01-01 MED ORDER — FOLIC ACID 1 MG PO TABS
1.0000 mg | ORAL_TABLET | Freq: Every day | ORAL | Status: DC
  Administered 2020-01-01 – 2020-01-07 (×6): 1 mg via ORAL
  Filled 2020-01-01 (×6): qty 1

## 2020-01-01 MED ORDER — ASPIRIN 81 MG PO CHEW
81.0000 mg | CHEWABLE_TABLET | ORAL | Status: AC
  Administered 2020-01-02: 81 mg via ORAL
  Filled 2020-01-01: qty 1

## 2020-01-01 MED ORDER — SODIUM CHLORIDE 0.9% FLUSH: 3.0000 mL | INTRAVENOUS | Status: DC | PRN

## 2020-01-01 MED ORDER — LORAZEPAM 2 MG/ML IJ SOLN: 0.5000 mg | Freq: Once | INTRAMUSCULAR | Status: DC

## 2020-01-01 MED ORDER — HEPARIN BOLUS VIA INFUSION
1700.0000 [IU] | Freq: Once | INTRAVENOUS | Status: AC
  Administered 2020-01-01: 1700 [IU] via INTRAVENOUS
  Filled 2020-01-01: qty 1700

## 2020-01-01 MED ORDER — SACUBITRIL-VALSARTAN 24-26 MG PO TABS
1.0000 | ORAL_TABLET | Freq: Two times a day (BID) | ORAL | Status: DC
  Administered 2020-01-01 (×2): 1 via ORAL
  Filled 2020-01-01 (×3): qty 1

## 2020-01-01 NOTE — ED Notes (Addendum)
Assumed care of pt at this time. Pt resting in stretcher,cont on bipap, tolerating well, no s/sx of acute distress at this time.

## 2020-01-01 NOTE — Progress Notes (Signed)
Patient had 23 beats of VTs. He's asymptomatic. Paged and reported it to [facility] NP and Brion Aliment NP and awaiting  for new directives. Will continue to monitor.

## 2020-01-01 NOTE — ED Notes (Signed)
Echo at bedside

## 2020-01-01 NOTE — Progress Notes (Signed)
[place] for IV heparin Indication: chest pain/ACS  Allergies  Allergen Reactions  . Lisinopril Swelling    Angioedema in 2020    Patient Measurements: Height: 5' 10.5" (179.1 cm) Weight: 57.7 kg (127 lb 3.3 oz) IBW/kg (Calculated) : 74.15 Heparin Dosing Weight: TBW  Vital Signs: Temp: 98.1 F (36.7 C) (10/11 1603) Temp Source: Oral (10/11 1603) BP: 121/74 (10/11 1800) Pulse Rate: 100 (10/11 1900)  Labs: Recent Labs    12/31/2019 0151 01/17/2020 0552 01/07/2020 0835 12/29/2019 1236 12/30/2019 1817  HGB 16.0 12.7*  --   --   --   HCT 51.1 42.2  --   --   --   PLT 273 207  --   --   --   HEPARINUNFRC  --   --   --   --  0.10*  CREATININE 0.90 0.90  --  1.14  --   TROPONINIHS  --  1,187* 4,615*  --   --     Estimated Creatinine Clearance: 49.9 mL/min (by C-G formula based on SCr of 1.14 mg/dL).   Medical History: Past Medical History:  Diagnosis Date  . Diverticulosis   . Hyperlipidemia   . Hypertension   . Internal hemorrhoids   . PVD (peripheral vascular disease) (HCC)     Medications:  Medications Prior to Admission  Medication Sig Dispense Refill Last Dose  . amLODipine (NORVASC) 10 MG tablet Take 10 mg by mouth daily.   12/31/2019 at Unknown time  . aspirin 325 MG tablet Take 325 mg by mouth daily.   12/31/2019 at Unknown time  . atorvastatin (LIPITOR) 80 MG tablet Take 40 mg by mouth at bedtime.    12/30/2019 at Unknown time  . Cholecalciferol 50 MCG (2000 UT) TABS Take 4,000 Units by mouth daily.   12/31/2019 at Unknown time  . clopidogrel (PLAVIX) 75 MG tablet Take 75 mg by mouth at bedtime.    12/30/2019  . tadalafil (CIALIS) 10 MG tablet Take 5 mg by mouth daily as needed for erectile dysfunction.   > 1 month   Scheduled:  . [START ON 01/12/2020] aspirin  81 mg Oral Pre-Cath  . [START ON 01/07/2020] aspirin EC  81 mg Oral Daily  . atorvastatin  80 mg Oral Daily  . chlorhexidine gluconate (MEDLINE KIT)  15 mL Mouth  Rinse BID  . Chlorhexidine Gluconate Cloth  6 each Topical Daily  . clopidogrel  75 mg Oral Daily  . folic acid  1 mg Oral Daily  . furosemide  40 mg Intravenous Q12H  . [START ON 01/10/2020] influenza vaccine adjuvanted  0.5 mL Intramuscular Tomorrow-1000  . mouth rinse  15 mL Mouth Rinse q12n4p  . multivitamin with minerals  1 tablet Oral Daily  . sacubitril-valsartan  1 tablet Oral BID  . sodium chloride flush  3 mL Intravenous Q12H  . thiamine  100 mg Oral Daily   Or  . thiamine  100 mg Intravenous Daily   Infusions:  . sodium chloride    . [START ON 01/10/2020] sodium chloride    . sodium chloride 250 mL (01/10/2020 1049)  . heparin 800 Units/hr (01/04/2020 1039)  . nitroGLYCERIN Stopped (01/15/2020 6761)    Assessment: 59 yoM with no prior cardiac history but PMH HTN/HLD, PAD, tobacco & EtOH, admitted for acute SOB. Found to have NSTEMI with acutely reduced LVEF. Pharmacy to dose IV heparin.   Baseline INR, aPTT: not done  Prior anticoagulation: none  Significant events:  Today,  01/09/2020 7:02 PM:   HL 0.1 sub-therapeutic  Infusion not paused per RN and no bleeding reported  CBC: Hgb slightly low; Plt WNL  Trop elevated and increasing  SCr WNL, baseline  Goal of Therapy: Heparin level 0.3-0.7 units/ml Monitor platelets by anticoagulation protocol: Yes  Plan:  Heparin 1700 unit bolus  Increase heparin rate 1000 units/hr  Recheck HL in 8 hours  Daily CBC, daily heparin level once stable  Monitor for signs of bleeding or thrombosis  Planning for heart catheterization once respiratory status more stable  Note patient on tadalafil (PDE5 inh) prior to admission (not for [facility]) - should not receive any nitrates until 48 hr after last dose of tadalafil to avoid unsafe drop in BP (med rec currently pending)  Ulice Dash, PharmD, BCPS [phone] 12/23/2019, 7:00 PM

## 2020-01-01 NOTE — Progress Notes (Signed)
  Echocardiogram 2D Echocardiogram has been performed.  Adrian Phillips 05-Jan-2020, 9:15 AM

## 2020-01-01 NOTE — ED Notes (Signed)
Provider notified of pt critical troponin of 1196

## 2020-01-01 NOTE — ED Notes (Signed)
Attempted to call report, rn will call back

## 2020-01-01 NOTE — H&P (Signed)
   Severe LV dysfunction. Needs left and right heart cath with coronary angiogram.

## 2020-01-01 NOTE — ED Notes (Signed)
Date and time results received: 12/29/2019 6:43 AM  (use smartphrase ".now" to insert current time)  Test:Troponin Critical Value: 1187   Name of Provider Notified: Dr.Karakandy  Orders Received? Or Actions Taken?:

## 2020-01-01 NOTE — Progress Notes (Signed)
Patient seen and examined personally, I reviewed the chart, history and physical and admission note, done by admitting physician this morning and agree with the same with following addendum.  Please refer to the morning admission note for more detailed plan of care.  Briefly, 69 year old male with hypertension, hyperlipidemia, PVD, ongoing tobacco abuse, alcohol abuse presented with worsening shortness of breath since 10/9 night.  Has been having increasing lower extremity edema for last couple of weeks.  In the ED acutely short of breath chest x-ray showed congestion concerning for CHF EKG with sinus tachycardia, LVH, was placed on BiPAP.  Blood pressure was hypotensive in 190s given IV Lasix and nitroglycerin infusion and oral meds were initiated.  Troponin came back positive cardiology was consulted. T-max 99.2, potassium 5.1, BNP 269 and troponin 1187, wbc 19,2k>12k  Patient just came up in 1233 stepdown unit from the ED.  He is off BiPAP.  He denies any chest pain or shortness of breath currently.  He is not in any acute distress.  Has bilateral basal crackles on exam, no use of respiratory accessory muscle not diaphoretic.  Acute systolic congestive heart " new onset, off NTG drip, cont IV Lasix.  EF 20% with MR on echo.  NSTEMI:Troponin:11>1187>4k consistent with NSTEMI: Discussed with cardiology, instituting IV heparin, continue aspirin Plavix, statin.  Noted plan for cardiac cath tomorrow morning at [facility].  NSVT:Monitor potassium and magnesium.  Cardiology following, monitoring telemetry  Acute hypoxic respiratory failure due to CHF needing BiPAP-weaned off BiPAP this morning and currently on nasal cannula.  Continue diuretics as #1, supplemental oxygen.  Hypertension emergency needing nitroglycerin drip in the ED, continue amlodipine,added Lisinopril  Leukocytosis resolving unclear etiology.  PVD with RLE bypass w.  Nonreversed R SVG 05/2003 by Dr. Darrick Penna on aspirin and  Plavix,statin. No leg pain  Slightly positive D-dimer, at 0.9- ?etiology- discussed with cardiology Dr Eden Emms- advised to hold off on CTA chest  In the light of low ef to avoid contrast load given that he will need cardiac cath tomorrow morning.  Starting heparin gtt for nstemi.   Tobacco abuse cessation advised.  Alcohol use denies drug abuse was drinking 2 beers and 30.  Discussed alcohol cessation. Smoking:  Patient will be transferred to [facility] as per cardiology recommendation preferably to [facility] unit if bed avaialble, for cardiac cath possibly tomorrow. I signed out to [facility], TRANSFER ORDER PLACED.continue to monitor. Pt is agreeable with plan.

## 2020-01-01 NOTE — Consult Note (Addendum)
Cardiology Consultation:   Patient ID: Adrian Phillips MRN: [id number]; DOB: [birth year]  Admit date: 12/29/2019 Date of Consult: 01/03/2020  Primary Care Provider: Bea Laura, MD [place] Cardiologist: No primary care provider on file. new [place] Electrophysiologist:  None    Patient Profile:   Adrian Phillips is a 69 y.o. male with a hx of HTN, HLD, PAD, tobacco and ETOH use, admitted for acute SOB who is being seen today for the evaluation of elevated troponins, NSVT at the request of Dr. Lupita Leash.  History of Present Illness:   Adrian Phillips with no prior cardiac hx, + HTN , HLD presented to ER by EMS after acute SOB while watching foot ball game.  Over last couple of weeks some lower ext edema but no SOB no chest pain, though with acute episode some indigestion type discomfort.  Feeling better currently - was on IV NTG but with BP drop it was stopped.    CXR with CHF.  Echo is being done now     EKG:  The EKG was personally reviewed and demonstrates: ST at 130 with LVH and T wave inversions V5-6 and Q waves V1-2 all present in Sept 2021.  Now with SR at 99 and improved T wave inversions. Telemetry:  Telemetry was personally reviewed and demonstrates:  SR to ST with NSVT longest at 20 beats.   Na 140 K+ now 5.1 was 3.5 BUN 15 Cr 0.90 ASAT 72  BNP 269 Hs troponin 11 then 1187  WBC 19.2 now 12.1 hgb 12.7 plts 207 DDimer 0.94 TSH 0.560 Neg covid  CXR mild CHF   + tobacco use + ETOH 5-6 cans per day of beer No FH CAD  BP 177/110 on arrival but improved and was low IV NTG stopped Neg 500 after 80 mg lasix on arrival and 40 this AM No pain now, breathing improved sitting up in bed.   Past Medical History:  Diagnosis Date   Diverticulosis    Hyperlipidemia    Hypertension    Internal hemorrhoids    PVD (peripheral vascular disease) [facility])     Past Surgical History:  Procedure Laterality Date   COLONOSCOPY  12/13/2008   Dr Janet Berlin at  [facility]      Home Medications:  Prior to Admission medications   Medication Sig Start Date End Date Taking? Authorizing Provider  amLODipine (NORVASC) 10 MG tablet Take 10 mg by mouth daily.    [provider]  aspirin 325 MG tablet Take 325 mg by mouth daily.    [provider]  atorvastatin (LIPITOR) 80 MG tablet Take 40 mg by mouth daily.    [provider]  Cholecalciferol 50 MCG (2000 UT) TABS Take 4,000 Units by mouth daily.    [provider]  clopidogrel (PLAVIX) 75 MG tablet Take 1 tablet by mouth daily.    [provider]  tadalafil (CIALIS) 10 MG tablet Take 5 mg by mouth daily as needed for erectile dysfunction.    [provider]    Inpatient Medications: Scheduled Meds:  amLODipine  10 mg Oral Daily   [START ON 01/04/2020] aspirin EC  81 mg Oral Daily   atorvastatin  80 mg Oral Daily   chlorhexidine gluconate (MEDLINE KIT)  15 mL Mouth Rinse BID   Chlorhexidine Gluconate Cloth  6 each Topical Daily   clopidogrel  75 mg Oral Daily   enoxaparin (LOVENOX) injection  40 mg Subcutaneous  Q24H   folic acid  1 mg Oral Daily   furosemide  40 mg Intravenous Q12H   lisinopril  10 mg Oral Daily   mouth rinse  15 mL Mouth Rinse q12n4p   multivitamin with minerals  1 tablet Oral Daily   thiamine  100 mg Oral Daily   Or   thiamine  100 mg Intravenous Daily   Continuous Infusions:  nitroGLYCERIN Stopped (01/17/2020 0632)   PRN Meds: acetaminophen **OR** acetaminophen, LORazepam **OR** LORazepam  Allergies:   No Known Allergies  Social History:   Social History   Socioeconomic History   Marital status: Widowed    Spouse name: Not on file   Number of children: Not on file   Years of education: Not on file   Highest education level: Not on file  Occupational History   Not on file  Tobacco Use   Smoking status: Current Every Day Smoker    Types: Cigarettes   Smokeless tobacco:  Never Used  Substance and Sexual Activity   Alcohol use: Not Currently   Drug use: Never   Sexual activity: Not on file  Other Topics Concern   Not on file  Social History Narrative   Not on file   Social Determinants of Health   Financial Resource Strain:    Difficulty of Paying Living Expenses: Not on file  Food Insecurity:    Worried About Running Out of Food in the Last Year: Not on file   Ran Out of Food in the Last Year: Not on file  Transportation Needs:    Lack of Transportation (Medical): Not on file   Lack of Transportation (Non-Medical): Not on file  Physical Activity:    Days of Exercise per Week: Not on file   Minutes of Exercise per Session: Not on file  Stress:    Feeling of Stress : Not on file  Social Connections:    Frequency of Communication with Friends and Family: Not on file   Frequency of Social Gatherings with Friends and Family: Not on file   Attends Religious Services: Not on file   Active Member of Clubs or Organizations: Not on file   Attends Club or Organization Meetings: Not on file   Marital Status: Not on file  Intimate Partner Violence:    Fear of Current or Ex-Partner: Not on file   Emotionally Abused: Not on file   Physically Abused: Not on file   Sexually Abused: Not on file    Family History:    Family History  Problem Relation Age of Onset   Colon polyps Neg Hx    Colon cancer Neg Hx      ROS:  Please see the history of present illness.  General:no colds or fevers, no weight changes Skin:no rashes or ulcers HEENT:no blurred vision, no congestion CV:see HPI PUL:see HPI GI:no diarrhea constipation or melena, no indigestion GU:no hematuria, no dysuria MS:no joint pain, no claudication Neuro:no syncope, no lightheadedness Endo:no diabetes, no thyroid disease  All other ROS reviewed and negative.     Physical Exam/Data:   Vitals:   01/20/2020 0740 01/21/2020 0800 01/17/2020 0830 12/31/2019 0857  BP: 111/79 116/78 116/88 116/88   Pulse: 86 98 100 100  Resp: 14 (!) 23 20   Temp:  (!) 97.4 F (36.3 C)    TempSrc:  Oral    SpO2: 100% 100% 100%   Weight:      Height:        Intake/Output Summary (Last 24   hours) at 12/25/2019 0859 Last data filed at 12/24/2019 0500 Gross per 24 hour  Intake --  Output 550 ml  Net -550 ml   Last 3 Weights 12/24/2019  Weight (lbs) 149 lb  Weight (kg) 67.586 kg     Body mass index is 21.38 kg/m.  General:  Well nourished, well developed, in no acute distress, sitting up in bed wearing BiPAP HEENT: normal Lymph: no adenopathy Neck: + JVD Endocrine:  No thryomegaly Vascular: No carotid bruits; pedal pulses 2+ bilaterally  Cardiac:  normal S1, S2; RRR; no murmur gallup rub or click Lungs:  diminished to auscultation bilaterally, occ wheezing, no rhonchi or rales  Abd: soft, nontender, no hepatomegaly  Ext: tr edema Musculoskeletal:  No deformities, BUE and BLE strength normal and equal Skin: warm and dry  Neuro:  Alert and oriented X 3 MAE follows commands, no focal abnormalities noted Psych:  Normal affect    Relevant CV Studies: Echo pending  Laboratory Data:  High Sensitivity Troponin:   Recent Labs  Lab 12/05/19 1719 12/23/2019 0552  TROPONINIHS 11 1,187*     Chemistry Recent Labs  Lab 12/22/2019 0151 01/04/2020 0552  NA 140 140  K 3.5 5.1  CL 106 107  CO2 24 23  GLUCOSE 174* 136*  BUN 12 15  CREATININE 0.90 0.90  CALCIUM 8.9 8.4*  GFRNONAA >60 >60  ANIONGAP 10 10    Recent Labs  Lab 12/31/2019 0552  PROT 6.6  ALBUMIN 3.6  AST 72*  ALT 37  ALKPHOS 99  BILITOT 0.5   Hematology Recent Labs  Lab 12/22/2019 0151 12/23/2019 0552  WBC 19.2* 12.1*  RBC 5.44 4.52  HGB 16.0 12.7*  HCT 51.1 42.2  MCV 93.9 93.4  MCH 29.4 28.1  MCHC 31.3 30.1  RDW 14.8 14.6  PLT 273 207   BNP Recent Labs  Lab 12/25/2019 0151  BNP 269.4*    DDimer  Recent Labs  Lab 12/24/2019 0552  DDIMER 0.94*     Radiology/Studies:  DG Chest Port 1 View  Result  Date: 01/05/2020 CLINICAL DATA:  Dyspnea EXAM: PORTABLE CHEST 1 VIEW COMPARISON:  12/05/2019 FINDINGS: The lungs are symmetrically well expanded. Mild cardiomegaly is again noted. There is interval development of mild bilateral mid and lower lung zone airspace and interstitial infiltrate most in keeping with mild pulmonary edema, likely cardiogenic in nature. No pneumothorax or pleural effusion. No acute bone abnormality. IMPRESSION: Interval development of mild cardiogenic failure. Electronically Signed   By: Ashesh  Parikh MD   On: 01/10/2020 02:29     Assessment and Plan:   Acute systolic HF.  Pt had been taking meds for HTN, suddenly SOB sitting watching TV, mild indigestion type pain.  After todal 120 mg lasix improving.   ddimer slightly elevated when pt able to tolerate CTA of chest he should have.  Echo is being done now. Once acute episode improved begin BB and ARB depending on echo and most likely will have Rt and Lt cardiac cath once respiratory status improved.   Elevated troponin, NSTEMI.  NTg just stopped, was started for CHF and BP.  Will  Check more troponin and would add IV heparin. Pt denies any bleeding HTN elevated on arrival but in acute distress, now improved on amlodipine 10 may need to stop depending on echo NSVT with normal K+ and Mg+ awaiting echo, would use amiodarone if continues.  HLD on lipitor 80 will check statins PAD with Rt fem -pop bypass with non reversed greater   SVG 05/2003 by Dr Fields/Early on ASA and plavix   NEW CM with EF 20% and MR.  Will plan transfer to [facility] on [facility] service and plan Rt and Lt cardiac cath tomorrow if pt can tolerate.  HF consult as well.      TIMI Risk Score for Unstable Angina or Non-ST Elevation MI:   The patient's TIMI risk score is 4, which indicates a 20% risk of all cause mortality, new or recurrent myocardial infarction or need for urgent revascularization in the next 14 days.  New York Heart Association (NYHA) Functional  Class NYHA Class III        For questions or updates, please contact [facility] Please consult [email or web address] for contact info under    Signed, Laura Ingold, NP  12/31/2019 8:59 AM  Patient examined chart reviewed. Discussed care with nures/PA Exam with thin black male low output. No edema JVP elevated with V wave PMI increased with MR apical murmur . Basilar crackles Review of echo shows EF 20-25% with moderate to severe MR. Continue lasix/iv nitro. EF diffusely depressed and not having chest pain despite elevated troponin. Will need right and left cath ? Tomorrow if we can stabilize him. Start entresto Hold beta blocker during acute event with low output. Transfer to [facility] as he will need right and left cath and would benefit from CHF team consult. Aortic root looked ok and no AR on TTE and don't think he should get contrast load right now to r/o PE/Dissection due to acute pulmonary edema    MD FACC 

## 2020-01-01 NOTE — ED Provider Notes (Signed)
[facility]-EMERGENCY DEPT Provider Note   CSN: [id number] Arrival date & time: 01/12/2020  0144     History Chief Complaint  Patient presents with  . Shortness of Breath   Level 5 caveat due to respiratory distress Adrian Phillips is a 69 y.o. male.  The history is provided by the patient and the EMS personnel. The history is limited by the condition of the patient.  Shortness of Breath Severity:  Severe Onset quality:  Sudden Timing:  Constant Progression:  Worsening Chronicity:  New Relieved by:  Nothing Worsened by:  Nothing Patient presents in respiratory distress.  EMS reports a report of shortness of breath the patient was hypoxic into the 60s.  Patient was placed on CPAP with minimal improvement.  Patient has history of COPD.  Reports he goes to [facility].  The rest of history is very limited  no other details known.     Past Medical History:  Diagnosis Date  . Diverticulosis   . Hyperlipidemia   . Hypertension   . Internal hemorrhoids   . PVD (peripheral vascular disease) [facility])     Patient Active Problem List   Diagnosis Date Noted  . Benign hypertension 08/28/2019  . Positive FIT (fecal immunochemical test) 08/28/2019    Past Surgical History:  Procedure Laterality Date  . COLONOSCOPY  12/13/2008   Dr Romelle Starcher at [facility]  . INGUINAL HERNIA REPAIR Right        Family History  Problem Relation Age of Onset  . Colon polyps Neg Hx   . Colon cancer Neg Hx     Social History   Tobacco Use  . Smoking status: Current Every Day Smoker    Types: Cigarettes  . Smokeless tobacco: Never Used  Substance Use Topics  . Alcohol use: Not Currently  . Drug use: Never    Home Medications Prior to Admission medications   Medication Sig Start Date End Date Taking? Authorizing Provider  amLODipine (NORVASC) 10 MG tablet Take 10 mg by mouth daily.    [provider]  aspirin 325 MG tablet Take 325 mg by  mouth daily.    [provider]  atorvastatin (LIPITOR) 80 MG tablet Take 40 mg by mouth daily.    [provider]  Cholecalciferol 50 MCG (2000 UT) TABS Take 4,000 Units by mouth daily.    [provider]  clopidogrel (PLAVIX) 75 MG tablet Take 1 tablet by mouth daily.    [provider]  tadalafil (CIALIS) 10 MG tablet Take 5 mg by mouth daily as needed for erectile dysfunction.    [provider]    Allergies    Patient has no known allergies.  Review of Systems   Review of Systems  Unable to perform ROS: Severe respiratory distress  Respiratory: Positive for shortness of breath.     Physical Exam Updated Vital Signs BP (!) 177/110 (BP Location: Right Arm)   Pulse (!) 132   Resp (!) 32   Ht 1.778 m (5\' 10" )   Wt 67.6 kg   SpO2 100%   BMI 21.38 kg/m   Physical Exam CONSTITUTIONAL: Elderly, distress noted HEAD: Normocephalic/atraumatic EYES: EOMI/PERRL ENMT: Mask in place NECK: supple no meningeal signs SPINE/BACK:entire spine nontender CV: Tachycardic LUNGS: Tachypnea, respiratory stress noted, wheezing bilaterally ABDOMEN: soft, nontender, no rebound or guarding, bowel sounds noted throughout abdomen GU:no cva tenderness NEURO: Pt is awake/alert/appropriate, moves all extremitiesx4. EXTREMITIES: pulses normal/equal, full ROM, no lower extremity edema  SKIN: Diaphoretic PSYCH: Anxious ED Results / Procedures / Treatments   Labs (all labs ordered are listed, but only abnormal results are displayed) Labs Reviewed  BASIC METABOLIC PANEL - Abnormal; Notable for the following components:      Result Value   Glucose, Bld 174 (*)    All other components within normal limits  CBC WITH DIFFERENTIAL/PLATELET - Abnormal; Notable for the following components:   WBC 19.2 (*)    Neutro Abs 15.5 (*)    Monocytes Absolute 1.5 (*)    Abs Immature Granulocytes 0.09 (*)    All other components within normal limits  BRAIN NATRIURETIC  PEPTIDE - Abnormal; Notable for the following components:   B Natriuretic Peptide 269.4 (*)    All other components within normal limits  RESPIRATORY PANEL BY RT PCR (FLU A&B, COVID)    EKG EKG Interpretation  Date/Time:  Monday January 01 2020 01:55:52 EDT Ventricular Rate:  130 PR Interval:    QRS Duration: 83 QT Interval:  304 QTC Calculation: 447 R Axis:   44 Text Interpretation: Sinus tachycardia LAE, consider biatrial enlargement Probable LVH with secondary repol abnrm Anterior Q waves, possibly due to LVH Interpretation limited secondary to artifact Confirmed by Zadie Rhine ([id number]) on 01/17/2020 2:18:20 AM   Radiology DG Chest Port 1 View  Result Date: 01/20/2020 CLINICAL DATA:  Dyspnea EXAM: PORTABLE CHEST 1 VIEW COMPARISON:  12/05/2019 FINDINGS: The lungs are symmetrically well expanded. Mild cardiomegaly is again noted. There is interval development of mild bilateral mid and lower lung zone airspace and interstitial infiltrate most in keeping with mild pulmonary edema, likely cardiogenic in nature. No pneumothorax or pleural effusion. No acute bone abnormality. IMPRESSION: Interval development of mild cardiogenic failure. Electronically Signed   By: Helyn Numbers MD   On: 01/21/2020 02:29    Procedures .Critical Care Performed by: Zadie Rhine, MD Authorized by: Zadie Rhine, MD   Critical care provider statement:    Critical care time (minutes):  40   Critical care start time:  01/09/2020 2:20 AM   Critical care end time:  01/09/2020 3:00 AM   Critical care time was exclusive of:  Separately billable procedures and treating other patients   Critical care was necessary to treat or prevent imminent or life-threatening deterioration of the following conditions:  Respiratory failure   Critical care was time spent personally by me on the following activities:  Development of treatment plan with patient or surrogate, discussions with consultants, evaluation of  patient's response to treatment, examination of patient, obtaining history from patient or surrogate, pulse oximetry, ordering and performing treatments and interventions, ordering and review of laboratory studies, ordering and review of radiographic studies and re-evaluation of patient's condition   I assumed direction of critical care for this patient from another provider in my specialty: no       Medications Ordered in ED Medications  nitroGLYCERIN 50 mg in dextrose 5 % 250 mL (0.2 mg/mL) infusion (5 mcg/min Intravenous New Bag/Given 01/09/2020 0247)  furosemide (LASIX) injection 80 mg (80 mg Intravenous Given 12/25/2019 0246)    ED Course  I have reviewed the triage vital signs and the nursing notes.  Pertinent labs & imaging results that were available during my care of the patient were reviewed by me and considered in my medical decision making (see chart for details).    MDM Rules/Calculators/A&P  2:22 AM Patient presents in respiratory distress on CPAP.  He was transitioned to BiPAP with some improvement.  Differential diagnosis includes COPD exacerbation, acute pulmonary edema, pneumonia, COVID-19 He was already given nebulizers, Solu-Medrol, magnesium in route to hospital 2:54 AM  Pt stabilizing on bipap Pulm. Edema noted, will start NTG and lasix 3:34 AM Patient's work of breathing has improved.  Heart rate is improved.  Patient has been stabilized in the emergency department.  Discussed with Dr. Toniann Fail for admission  Adrian Phillips was evaluated in Emergency Department on 01/10/2020 for the symptoms described in the history of present illness. He was evaluated in the context of the global COVID-19 pandemic, which necessitated consideration that the patient might be at risk for infection with the SARS-CoV-2 virus that causes COVID-19. Institutional protocols and algorithms that pertain to the evaluation of patients at risk for COVID-19 are in a  state of rapid change based on information released by regulatory bodies including the CDC and federal and state organizations. These policies and algorithms were followed during the patient's care in the ED.  Final Clinical Impression(s) / ED Diagnoses Final diagnoses:  Acute respiratory failure with hypoxia (HCC)  Acute pulmonary edema [facility])    Rx / DC Orders ED Discharge Orders    None       Zadie Rhine, MD 01/09/2020 [phone]

## 2020-01-01 NOTE — Progress Notes (Signed)
ANTICOAGULATION CONSULT NOTE - Initial Consult  Pharmacy Consult for IV heparin Indication: chest pain/ACS  No Known Allergies  Patient Measurements: Height: _0  (177.8 cm) Weight: 67.6 kg (149 lb) IBW/kg (Calculated) : 73 Heparin Dosing Weight: TBW  Vital Signs: Temp: 97.4 F (36.3 C) (10/11 0800) Temp Source: Oral (10/11 0800) BP: 108/79 (10/11 0910) Pulse Rate: 82 (10/11 0910)  Labs: Recent Labs    01/16/2020 0151 01/20/2020 0552 12/26/2019 0835  HGB 16.0 12.7*  --   HCT 51.1 42.2  --   PLT 273 207  --   CREATININE 0.90 0.90  --   TROPONINIHS  --  1,187* 4,615*    Estimated Creatinine Clearance: 74.1 mL/min (by C-G formula based on SCr of 0.9 mg/dL).   Medical History: Past Medical History:  Diagnosis Date  . Diverticulosis   . Hyperlipidemia   . Hypertension   . Internal hemorrhoids   . PVD (peripheral vascular disease) (HCC)     Medications:  Medications Prior to Admission  Medication Sig Dispense Refill Last Dose  . amLODipine (NORVASC) 10 MG tablet Take 10 mg by mouth daily.     Marland Kitchen aspirin 325 MG tablet Take 325 mg by mouth daily.     Marland Kitchen atorvastatin (LIPITOR) 80 MG tablet Take 40 mg by mouth daily.     . Cholecalciferol 50 MCG (2000 UT) TABS Take 4,000 Units by mouth daily.     . clopidogrel (PLAVIX) 75 MG tablet Take 1 tablet by mouth daily.     . tadalafil (CIALIS) 10 MG tablet Take 5 mg by mouth daily as needed for erectile dysfunction.      Scheduled:  . [START ON 12/24/2019] aspirin EC  81 mg Oral Daily  . atorvastatin  80 mg Oral Daily  . chlorhexidine gluconate (MEDLINE KIT)  15 mL Mouth Rinse BID  . Chlorhexidine Gluconate Cloth  6 each Topical Daily  . clopidogrel  75 mg Oral Daily  . folic acid  1 mg Oral Daily  . furosemide  40 mg Intravenous Q12H  . heparin  4,000 Units Intravenous Once  . hydrALAZINE  10 mg Oral Q8H  . mouth rinse  15 mL Mouth Rinse q12n4p  . multivitamin with minerals  1 tablet Oral Daily  . thiamine  100 mg Oral  Daily   Or  . thiamine  100 mg Intravenous Daily   Infusions:  . heparin    . nitroGLYCERIN Stopped (12/22/2019 1245)    Assessment: 69 yoM with no prior cardiac history but PMH HTN/HLD, PAD, tobacco & EtOH, admitted for acute SOB. Found to have NSTEMI with acutely reduced LVEF. Pharmacy to dose IV heparin.   Baseline INR, aPTT: not done  Prior anticoagulation: none  Significant events:  Today, 01/10/2020:  CBC: Hgb slightly low; Plt WNL  Trop elevated and increasing  SCr WNL, baseline  No bleeding or infusion issues per nursing  Goal of Therapy: Heparin level 0.3-0.7 units/ml Monitor platelets by anticoagulation protocol: Yes  Plan:  Heparin 4000 units IV bolus x 1  Heparin 800 units/hr IV infusion  Check heparin level 6-8 hrs after start  Daily CBC, daily heparin level once stable  Monitor for signs of bleeding or thrombosis  Planning for heart catheterization once respiratory status more stable  Note patient on tadalafil (PDE5 inh) prior to admission (not for [facility]) - should not receive any nitrates until 48 hr after last dose of tadalafil to avoid unsafe drop in BP (med rec currently pending)  Dian Situ  [place], PharmD, [place] 01/20/2020, 10:00 AM

## 2020-01-01 NOTE — ED Notes (Signed)
Pt has left unit to go to ICU, Christian RN CN has been notified by phone of result Trop [id number].

## 2020-01-01 NOTE — Progress Notes (Signed)
Notified Don Perking RN in regards to critical troponin of [phone] and she will pass critical troponin to MD.

## 2020-01-01 NOTE — Progress Notes (Signed)
While completing a medication history the patient reported a history of angioedema with lisinopril and it was added to his allergy list.  The patient received Entresto at 12:16 01/02/2020 which contains an ARB and has the possibility of causing angioedema as well. I contacted the attending Dr. Dayna Barker via Secure Chat and he asked me to contact the patient's RN and cardiology.  I spoke with Stevphen Meuse RN who reported no sign of angioedema currently and she will monitor.  I spoke with Nada Boozer NP and informed her of the situation. No orders were given.   Charolotte Eke, PharmD Admin. Coordinator of Transitions of Care 01/19/2020,4:18 PM

## 2020-01-01 NOTE — ED Triage Notes (Signed)
Pt came in via EMS with c/o SOB. On arrival saturations were 68%. On arrival to hospital pt was on CPAP. Pt currently on BiPAP. Sats are 99%. Pt is retracting and breathing at 30-35 times per minute

## 2020-01-01 NOTE — H&P (Addendum)
History and Physical    Adrian Phillips UYQ:[id number] DOB: 03-08-1951 DOA: 12/22/2019  PCP: Jana Hakim, MD  Patient coming from: Home.  Chief Complaint: Shortness of breath.  HPI: Adrian Phillips is a 69 y.o. male with history of hypertension, hyperlipidemia, peripheral vascular disease, ongoing tobacco and alcohol abuse presents to the ER because of sudden onset of worsening shortness of breath since last night.  Denies any chest pain productive cough fever chills.  Has been noticing increasing lower extremity edema for the last couple of weeks.  Patient states he has not missed his home medication including antihypertensives and dyslipidemia medications.  ED Course: In the ER patient was acutely short of breath had to be placed on BiPAP with chest x-ray showing congestion concerning for CHF.  EKG shows sinus tachycardia with LVH.  Blood pressures markedly elevated with systolic blood pressure 192 diastolic 101 patient was given Lasix 80 mg IV and nitroglycerin infusion.   Review of Systems: As per HPI, rest all negative.   Past Medical History:  Diagnosis Date  . Diverticulosis   . Hyperlipidemia   . Hypertension   . Internal hemorrhoids   . PVD (peripheral vascular disease) (HCC)     Past Surgical History:  Procedure Laterality Date  . COLONOSCOPY  12/13/2008   Dr Romelle Starcher at [facility]  . INGUINAL HERNIA REPAIR Right      reports that he has been smoking cigarettes. He has never used smokeless tobacco. He reports previous alcohol use. He reports that he does not use drugs.  No Known Allergies  Family History  Problem Relation Age of Onset  . Colon polyps Neg Hx   . Colon cancer Neg Hx     Prior to Admission medications   Medication Sig Start Date End Date Taking? Authorizing Provider  amLODipine (NORVASC) 10 MG tablet Take 10 mg by mouth daily.    [provider]  aspirin 325 MG tablet Take 325 mg by mouth daily.    [provider]  atorvastatin (LIPITOR) 80 MG tablet Take 40 mg by mouth daily.    [provider]  Cholecalciferol 50 MCG (2000 UT) TABS Take 4,000 Units by mouth daily.    [provider]  clopidogrel (PLAVIX) 75 MG tablet Take 1 tablet by mouth daily.    [provider]  tadalafil (CIALIS) 10 MG tablet Take 5 mg by mouth daily as needed for erectile dysfunction.    [provider]    Physical Exam: Constitutional: Moderately built and nourished. Vitals:   01/08/2020 0410 12/26/2019 0430 01/03/2020 0440 01/10/2020 0450  BP: 105/75 102/74 95/84 100/76  Pulse: (!) 101 (!) 103 (!) 113 95  Resp: (!) 23 (!) 23 (!) 24 (!) 22  Temp:      TempSrc:      SpO2: 100% 100% 100% 100%  Weight:      Height:       Eyes: Anicteric no pallor. ENMT: No discharge from the ears eyes nose or mouth. Neck: JVD elevated no mass felt. Respiratory: No rhonchi or crepitations. Cardiovascular: S1-S2 heard. Abdomen: Soft nontender bowel sounds present. Musculoskeletal: Mild edema of the both lower extremities. Skin: No rash. Neurologic: Alert awake oriented to time place and person.  Moves all extremities. Psychiatric: Appears normal.  Normal affect.   Labs on Admission: I have personally reviewed following labs and imaging studies  CBC: Recent Labs  Lab 12/26/2019 0151  WBC 19.2*  NEUTROABS 15.5*  HGB 16.0  HCT 51.1  MCV 93.9  PLT 273   Basic Metabolic Panel: Recent Labs  Lab 15-Jan-2020 0151  NA 140  K 3.5  CL 106  CO2 24  GLUCOSE 174*  BUN 12  CREATININE 0.90  CALCIUM 8.9   GFR: Estimated Creatinine Clearance: 74.1 mL/min (by C-G formula based on SCr of 0.9 mg/dL). Liver Function Tests: No results for input(s): AST, ALT, ALKPHOS, BILITOT, PROT, ALBUMIN in the last 168 hours. No results for input(s): LIPASE, AMYLASE in the last 168 hours. No results for input(s): AMMONIA in the last 168 hours. Coagulation Profile: No results for input(s): INR, PROTIME in  the last 168 hours. Cardiac Enzymes: No results for input(s): CKTOTAL, CKMB, CKMBINDEX, TROPONINI in the last 168 hours. BNP (last 3 results) No results for input(s): PROBNP in the last 8760 hours. HbA1C: No results for input(s): HGBA1C in the last 72 hours. CBG: No results for input(s): GLUCAP in the last 168 hours. Lipid Profile: No results for input(s): CHOL, HDL, LDLCALC, TRIG, CHOLHDL, LDLDIRECT in the last 72 hours. Thyroid Function Tests: No results for input(s): TSH, T4TOTAL, FREET4, T3FREE, THYROIDAB in the last 72 hours. Anemia Panel: No results for input(s): VITAMINB12, FOLATE, FERRITIN, TIBC, IRON, RETICCTPCT in the last 72 hours. Urine analysis: No results found for: COLORURINE, APPEARANCEUR, LABSPEC, PHURINE, GLUCOSEU, HGBUR, BILIRUBINUR, KETONESUR, PROTEINUR, UROBILINOGEN, NITRITE, LEUKOCYTESUR Sepsis Labs: @LABRCNTIP (procalcitonin:4,lacticidven:4) ) Recent Results (from the past 240 hour(s))  Respiratory Panel by RT PCR (Flu A&B, Covid) - Nasopharyngeal Swab     Status: None   Collection Time: January 15, 2020  1:51 AM   Specimen: Nasopharyngeal Swab  Result Value Ref Range Status   SARS Coronavirus 2 by RT PCR NEGATIVE NEGATIVE Final    Comment: (NOTE) SARS-CoV-2 target nucleic acids are NOT DETECTED.  The SARS-CoV-2 RNA is generally detectable in upper respiratoy specimens during the acute phase of infection. The lowest concentration of SARS-CoV-2 viral copies this assay can detect is 131 copies/mL. A negative result does not preclude SARS-Cov-2 infection and should not be used as the sole basis for treatment or other patient management decisions. A negative result may occur with  improper specimen collection/handling, submission of specimen other than nasopharyngeal swab, presence of viral mutation(s) within the areas targeted by this assay, and inadequate number of viral copies (<131 copies/mL). A negative result must be combined with clinical observations, patient  history, and epidemiological information. The expected result is Negative.  Fact Sheet for Patients:  03/02/20  Fact Sheet for Healthcare Providers:  https://www.moore.com/  This test is no t yet approved or cleared by the https://www.young.biz/ FDA and  has been authorized for detection and/or diagnosis of SARS-CoV-2 by FDA under an Emergency Use Authorization (EUA). This EUA will remain  in effect (meaning this test can be used) for the duration of the COVID-19 declaration under Section 564(b)(1) of the Act, 21 U.S.C. section 360bbb-3(b)(1), unless the authorization is terminated or revoked sooner.     Influenza A by PCR NEGATIVE NEGATIVE Final   Influenza B by PCR NEGATIVE NEGATIVE Final    Comment: (NOTE) The Xpert Xpress SARS-CoV-2/FLU/RSV assay is intended as an aid in  the diagnosis of influenza from Nasopharyngeal swab specimens and  should not be used as a sole basis for treatment. Nasal washings and  aspirates are unacceptable for Xpert Xpress SARS-CoV-2/FLU/RSV  testing.  Fact Sheet for Patients: [place]  Fact Sheet for Healthcare Providers: https://www.moore.com/  This test is not yet approved or cleared by the https://www.young.biz/ and  has been authorized for detection and/or diagnosis of SARS-CoV-2 by  FDA under an Emergency Use Authorization (EUA). This EUA will remain  in effect (meaning this test can be used) for the duration of the  Covid-19 declaration under Section 564(b)(1) of the Act, 21  U.S.C. section 360bbb-3(b)(1), unless the authorization is  terminated or revoked. Performed at [facility], [address], [place], [place] [address]      Radiological Exams on Admission: DG Chest Port 1 View  Result Date: 12/27/2019 CLINICAL DATA:  Dyspnea EXAM: PORTABLE CHEST 1 VIEW COMPARISON:  12/05/2019 FINDINGS: The lungs are symmetrically  well expanded. Mild cardiomegaly is again noted. There is interval development of mild bilateral mid and lower lung zone airspace and interstitial infiltrate most in keeping with mild pulmonary edema, likely cardiogenic in nature. No pneumothorax or pleural effusion. No acute bone abnormality. IMPRESSION: Interval development of mild cardiogenic failure. Electronically Signed   By: Helyn Numbers MD   On: 12/22/2019 02:29    EKG: Independently reviewed.  Sinus tachycardia.  Assessment/Plan Principal Problem:   Acute respiratory failure with hypoxia (HCC) Active Problems:   Hypertensive urgency   Acute CHF (congestive heart failure) (HCC)   Tobacco abuse    1. Acute respiratory failure with hypoxia likely from acute CHF EF unknown for which I have ordered 2D echo.  Patient did receive Lasix 80 mg IV so far he has put out 500 cc.  I have added Lasix 40 mg IV every 12 and will continue nitroglycerin for now and I have added along with amlodipine I have added lisinopril.  We will try to wean off the nitroglycerin after the next dose of Lasix and his antihypertensives.  Check 2D echo.  Cardiac markers TSH and D-dimer. 2. Hypertensive urgency likely contributing to patient's symptoms.  On nitroglycerin infusion at this time I have added lisinopril along with his Norvasc.  Follow blood pressure trends. 3. Hyperlipidemia on statins. 4. History of peripheral vascular disease on aspirin Plavix and statins. 5. Tobacco and alcohol abuse advised about quitting.  On CIWA protocol. 6. COPD.  Patient but no active wheezing on my exam. 7. Hyperglycemia -check hemoglobin A1c. 8. Leukocytosis could be reactionary.  Patient is afebrile.  No definite signs of any infection at this time we will continue to monitor and follow CBC.  Addendum -patient's troponin came at 1100 patient still has no chest pain will get a repeat EKG trend cardiac markers aspirin and statins ordered cardiology notified.   Since patient  is having acute respiratory failure presently on FiO2 of 60% on BiPAP will need close monitoring for any further worsening in inpatient status.   DVT prophylaxis: Lovenox. Code Status: Full code. Family Communication: Discussed with patient. Disposition Plan: Home. Consults called: Cardiology. Admission status: Inpatient.   Eduard Clos MD [facility] Pager [phone].  If 7PM-7AM, please contact night-coverage [email or web address] Password [email or web address]  12/27/2019, 5:20 AM

## 2020-01-02 ENCOUNTER — Other Ambulatory Visit: Payer: Self-pay | Admitting: *Deleted

## 2020-01-02 ENCOUNTER — Encounter (HOSPITAL_COMMUNITY): Payer: Self-pay | Admitting: Interventional Cardiology

## 2020-01-02 ENCOUNTER — Encounter (HOSPITAL_COMMUNITY): Admission: EM | Disposition: E | Payer: Self-pay | Source: Home / Self Care | Attending: Internal Medicine

## 2020-01-02 DIAGNOSIS — I5021 Acute systolic (congestive) heart failure: Secondary | ICD-10-CM | POA: Diagnosis not present

## 2020-01-02 DIAGNOSIS — I214 Non-ST elevation (NSTEMI) myocardial infarction: Secondary | ICD-10-CM

## 2020-01-02 DIAGNOSIS — I25750 Atherosclerosis of native coronary artery of transplanted heart with unstable angina: Secondary | ICD-10-CM

## 2020-01-02 DIAGNOSIS — I5043 Acute on chronic combined systolic (congestive) and diastolic (congestive) heart failure: Secondary | ICD-10-CM

## 2020-01-02 DIAGNOSIS — I160 Hypertensive urgency: Secondary | ICD-10-CM | POA: Diagnosis not present

## 2020-01-02 DIAGNOSIS — J9601 Acute respiratory failure with hypoxia: Secondary | ICD-10-CM | POA: Diagnosis not present

## 2020-01-02 DIAGNOSIS — I340 Nonrheumatic mitral (valve) insufficiency: Secondary | ICD-10-CM | POA: Diagnosis not present

## 2020-01-02 DIAGNOSIS — I25110 Atherosclerotic heart disease of native coronary artery with unstable angina pectoris: Secondary | ICD-10-CM

## 2020-01-02 DIAGNOSIS — I2510 Atherosclerotic heart disease of native coronary artery without angina pectoris: Secondary | ICD-10-CM | POA: Diagnosis not present

## 2020-01-02 HISTORY — PX: RIGHT/LEFT HEART CATH AND CORONARY ANGIOGRAPHY: CATH118266

## 2020-01-02 LAB — CBC
HCT: 43.2 % (ref 39.0–52.0)
Hemoglobin: 13.7 g/dL (ref 13.0–17.0)
MCH: 28.4 pg (ref 26.0–34.0)
MCHC: 31.7 g/dL (ref 30.0–36.0)
MCV: 89.6 fL (ref 80.0–100.0)
Platelets: 220 10*3/uL (ref 150–400)
RBC: 4.82 MIL/uL (ref 4.22–5.81)
RDW: 14.6 % (ref 11.5–15.5)
WBC: 15.8 10*3/uL — ABNORMAL HIGH (ref 4.0–10.5)
nRBC: 0 % (ref 0.0–0.2)

## 2020-01-02 LAB — POCT I-STAT 7, (LYTES, BLD GAS, ICA,H+H)
Acid-Base Excess: 2 mmol/L (ref 0.0–2.0)
Acid-Base Excess: 4 mmol/L — ABNORMAL HIGH (ref 0.0–2.0)
Bicarbonate: 27.9 mmol/L (ref 20.0–28.0)
Bicarbonate: 28.4 mmol/L — ABNORMAL HIGH (ref 20.0–28.0)
Calcium, Ion: 1.11 mmol/L — ABNORMAL LOW (ref 1.15–1.40)
Calcium, Ion: 1.13 mmol/L — ABNORMAL LOW (ref 1.15–1.40)
HCT: 43 % (ref 39.0–52.0)
HCT: 46 % (ref 39.0–52.0)
Hemoglobin: 14.6 g/dL (ref 13.0–17.0)
Hemoglobin: 15.6 g/dL (ref 13.0–17.0)
O2 Saturation: 90 %
O2 Saturation: 95 %
Potassium: 3.7 mmol/L (ref 3.5–5.1)
Potassium: 3.9 mmol/L (ref 3.5–5.1)
Sodium: 133 mmol/L — ABNORMAL LOW (ref 135–145)
Sodium: 140 mmol/L (ref 135–145)
TCO2: 29 mmol/L (ref 22–32)
TCO2: 30 mmol/L (ref 22–32)
pCO2 arterial: 40.7 mmHg (ref 32.0–48.0)
pCO2 arterial: 48.4 mmHg — ABNORMAL HIGH (ref 32.0–48.0)
pH, Arterial: 7.369 (ref 7.350–7.450)
pH, Arterial: 7.451 — ABNORMAL HIGH (ref 7.350–7.450)
pO2, Arterial: 60 mmHg — ABNORMAL LOW (ref 83.0–108.0)
pO2, Arterial: 73 mmHg — ABNORMAL LOW (ref 83.0–108.0)

## 2020-01-02 LAB — POCT I-STAT EG7
Acid-Base Excess: 6 mmol/L — ABNORMAL HIGH (ref 0.0–2.0)
Acid-Base Excess: 6 mmol/L — ABNORMAL HIGH (ref 0.0–2.0)
Bicarbonate: 30.8 mmol/L — ABNORMAL HIGH (ref 20.0–28.0)
Bicarbonate: 31.5 mmol/L — ABNORMAL HIGH (ref 20.0–28.0)
Calcium, Ion: 1.15 mmol/L (ref 1.15–1.40)
Calcium, Ion: 1.16 mmol/L (ref 1.15–1.40)
HCT: 46 % (ref 39.0–52.0)
HCT: 47 % (ref 39.0–52.0)
Hemoglobin: 15.6 g/dL (ref 13.0–17.0)
Hemoglobin: 16 g/dL (ref 13.0–17.0)
O2 Saturation: 55 %
O2 Saturation: 62 %
Potassium: 4 mmol/L (ref 3.5–5.1)
Potassium: 4 mmol/L (ref 3.5–5.1)
Sodium: 141 mmol/L (ref 135–145)
Sodium: 141 mmol/L (ref 135–145)
TCO2: 32 mmol/L (ref 22–32)
TCO2: 33 mmol/L — ABNORMAL HIGH (ref 22–32)
pCO2, Ven: 45.1 mmHg (ref 44.0–60.0)
pCO2, Ven: 45.2 mmHg (ref 44.0–60.0)
pH, Ven: 7.443 — ABNORMAL HIGH (ref 7.250–7.430)
pH, Ven: 7.45 — ABNORMAL HIGH (ref 7.250–7.430)
pO2, Ven: 28 mmHg — CL (ref 32.0–45.0)
pO2, Ven: 31 mmHg — CL (ref 32.0–45.0)

## 2020-01-02 LAB — BASIC METABOLIC PANEL
Anion gap: 8 (ref 5–15)
BUN: 27 mg/dL — ABNORMAL HIGH (ref 8–23)
CO2: 30 mmol/L (ref 22–32)
Calcium: 8.5 mg/dL — ABNORMAL LOW (ref 8.9–10.3)
Chloride: 103 mmol/L (ref 98–111)
Creatinine, Ser: 1.17 mg/dL (ref 0.61–1.24)
GFR, Estimated: >60 mL/min (ref >60)
Glucose, Bld: 132 mg/dL — ABNORMAL HIGH (ref 70–99)
Potassium: 4.5 mmol/L (ref 3.5–5.1)
Sodium: 141 mmol/L (ref 135–145)

## 2020-01-02 LAB — URINALYSIS, ROUTINE W REFLEX MICROSCOPIC
Bilirubin Urine: NEGATIVE
Glucose, UA: NEGATIVE mg/dL
Hgb urine dipstick: NEGATIVE
Ketones, ur: NEGATIVE mg/dL
Leukocytes,Ua: NEGATIVE
Nitrite: NEGATIVE
Protein, ur: NEGATIVE mg/dL
Specific Gravity, Urine: 1.011 (ref 1.005–1.030)
pH: 6 (ref 5.0–8.0)

## 2020-01-02 LAB — PROTIME-INR
INR: 1.1 (ref 0.8–1.2)
Prothrombin Time: 14.1 seconds (ref 11.4–15.2)

## 2020-01-02 LAB — HEPARIN LEVEL (UNFRACTIONATED): Heparin Unfractionated: 0.29 IU/mL — ABNORMAL LOW (ref 0.30–0.70)

## 2020-01-02 SURGERY — RIGHT/LEFT HEART CATH AND CORONARY ANGIOGRAPHY
Anesthesia: LOCAL

## 2020-01-02 MED ORDER — SODIUM CHLORIDE 0.9 % IV SOLN: 250.0000 mL | INTRAVENOUS | Status: DC | PRN

## 2020-01-02 MED ORDER — ONDANSETRON HCL 4 MG/2ML IJ SOLN: 4.0000 mg | Freq: Four times a day (QID) | INTRAMUSCULAR | Status: DC | PRN

## 2020-01-02 MED ORDER — HYDRALAZINE HCL 20 MG/ML IJ SOLN: 10.0000 mg | INTRAMUSCULAR | Status: AC | PRN

## 2020-01-02 MED ORDER — ASPIRIN 81 MG PO CHEW: 81.0000 mg | CHEWABLE_TABLET | Freq: Every day | ORAL | Status: DC

## 2020-01-02 MED ORDER — OXYCODONE HCL 5 MG PO TABS
5.0000 mg | ORAL_TABLET | ORAL | Status: DC | PRN
  Administered 2020-01-03 – 2020-01-06 (×5): 10 mg via ORAL
  Administered 2020-01-06 (×2): 5 mg via ORAL
  Filled 2020-01-02: qty 1
  Filled 2020-01-02 (×5): qty 2
  Filled 2020-01-02: qty 1

## 2020-01-02 MED ORDER — DIGOXIN 125 MCG PO TABS
0.1250 mg | ORAL_TABLET | Freq: Every day | ORAL | Status: DC
  Administered 2020-01-02 – 2020-01-07 (×6): 0.125 mg via ORAL
  Filled 2020-01-02 (×6): qty 1

## 2020-01-02 MED ORDER — IOHEXOL 350 MG/ML SOLN
INTRAVENOUS | Status: AC
  Filled 2020-01-02: qty 1

## 2020-01-02 MED ORDER — HEPARIN (PORCINE) IN NACL 1000-0.9 UT/500ML-% IV SOLN
INTRAVENOUS | Status: AC
  Filled 2020-01-02: qty 1000

## 2020-01-02 MED ORDER — IOHEXOL 350 MG/ML SOLN
INTRAVENOUS | Status: DC | PRN
  Administered 2020-01-02: 65 mL

## 2020-01-02 MED ORDER — AMIODARONE HCL 200 MG PO TABS
200.0000 mg | ORAL_TABLET | Freq: Two times a day (BID) | ORAL | Status: DC
  Administered 2020-01-02 – 2020-01-07 (×11): 200 mg via ORAL
  Filled 2020-01-02 (×11): qty 1

## 2020-01-02 MED ORDER — ATORVASTATIN CALCIUM 80 MG PO TABS: 80.0000 mg | ORAL_TABLET | Freq: Every day | ORAL | Status: DC

## 2020-01-02 MED ORDER — HEPARIN (PORCINE) 25000 UT/250ML-% IV SOLN
1100.0000 [IU]/h | INTRAVENOUS | Status: DC
  Administered 2020-01-02: 1100 [IU]/h via INTRAVENOUS

## 2020-01-02 MED ORDER — HEPARIN (PORCINE) IN NACL 1000-0.9 UT/500ML-% IV SOLN
INTRAVENOUS | Status: DC | PRN
  Administered 2020-01-02 (×2): 500 mL

## 2020-01-02 MED ORDER — MIDAZOLAM HCL 2 MG/2ML IJ SOLN
INTRAMUSCULAR | Status: AC
  Filled 2020-01-02: qty 2

## 2020-01-02 MED ORDER — LIDOCAINE HCL (PF) 1 % IJ SOLN
INTRAMUSCULAR | Status: AC
  Filled 2020-01-02: qty 30

## 2020-01-02 MED ORDER — MIDAZOLAM HCL 2 MG/2ML IJ SOLN
INTRAMUSCULAR | Status: DC | PRN
  Administered 2020-01-02: 1 mg via INTRAVENOUS

## 2020-01-02 MED ORDER — HEPARIN SODIUM (PORCINE) 1000 UNIT/ML IJ SOLN
INTRAMUSCULAR | Status: DC | PRN
  Administered 2020-01-02: 3000 [IU] via INTRAVENOUS

## 2020-01-02 MED ORDER — SPIRONOLACTONE 12.5 MG HALF TABLET
12.5000 mg | ORAL_TABLET | Freq: Every day | ORAL | Status: DC
  Administered 2020-01-02 – 2020-01-03 (×2): 12.5 mg via ORAL
  Filled 2020-01-02 (×2): qty 1

## 2020-01-02 MED ORDER — VERAPAMIL HCL 2.5 MG/ML IV SOLN
INTRAVENOUS | Status: DC | PRN
  Administered 2020-01-02: 10 mL via INTRA_ARTERIAL

## 2020-01-02 MED ORDER — FENTANYL CITRATE (PF) 100 MCG/2ML IJ SOLN
INTRAMUSCULAR | Status: AC
  Filled 2020-01-02: qty 2

## 2020-01-02 MED ORDER — FENTANYL CITRATE (PF) 100 MCG/2ML IJ SOLN
INTRAMUSCULAR | Status: DC | PRN
Start: 2020-01-02 — End: 2020-01-02
  Administered 2020-01-02: 25 ug via INTRAVENOUS
  Administered 2020-01-02: 50 ug via INTRAVENOUS

## 2020-01-02 MED ORDER — LIDOCAINE HCL (PF) 1 % IJ SOLN
INTRAMUSCULAR | Status: DC | PRN
  Administered 2020-01-02: 5 mL

## 2020-01-02 MED ORDER — ACETAMINOPHEN 325 MG PO TABS
650.0000 mg | ORAL_TABLET | ORAL | Status: DC | PRN
  Administered 2020-01-05: 650 mg via ORAL
  Filled 2020-01-02: qty 2

## 2020-01-02 MED ORDER — SODIUM CHLORIDE 0.9% FLUSH: 3.0000 mL | INTRAVENOUS | Status: DC | PRN

## 2020-01-02 MED ORDER — LABETALOL HCL 5 MG/ML IV SOLN: 10.0000 mg | INTRAVENOUS | Status: AC | PRN

## 2020-01-02 MED ORDER — SODIUM CHLORIDE 0.9% FLUSH
3.0000 mL | Freq: Two times a day (BID) | INTRAVENOUS | Status: DC
  Administered 2020-01-03 – 2020-01-08 (×7): 3 mL via INTRAVENOUS

## 2020-01-02 MED ORDER — HEPARIN (PORCINE) 25000 UT/250ML-% IV SOLN
1150.0000 [IU]/h | INTRAVENOUS | Status: AC
  Administered 2020-01-03 – 2020-01-06 (×3): 1200 [IU]/h via INTRAVENOUS
  Administered 2020-01-06: 1150 [IU]/h via INTRAVENOUS
  Filled 2020-01-02 (×6): qty 250

## 2020-01-02 MED ORDER — VERAPAMIL HCL 2.5 MG/ML IV SOLN
INTRAVENOUS | Status: AC
  Filled 2020-01-02: qty 2

## 2020-01-02 SURGICAL SUPPLY — 13 items
CATH 5FR JL3.5 JR4 ANG PIG MP (CATHETERS) ×1 IMPLANT
CATH SWAN GANZ 7F STRAIGHT (CATHETERS) ×1 IMPLANT
DEVICE RAD COMP TR BAND LRG (VASCULAR PRODUCTS) ×1 IMPLANT
GLIDESHEATH SLEND A-KIT 6F 22G (SHEATH) ×1 IMPLANT
GLIDESHEATH SLENDER 7FR .021G (SHEATH) ×1 IMPLANT
GUIDEWIRE INQWIRE 1.5J.035X260 (WIRE) IMPLANT
INQWIRE 1.5J .035X260CM (WIRE) ×2
KIT ENCORE 26 ADVANTAGE (KITS) IMPLANT
KIT HEART LEFT (KITS) ×2 IMPLANT
PACK CARDIAC CATHETERIZATION (CUSTOM PROCEDURE TRAY) ×2 IMPLANT
SHEATH PROBE COVER 6X72 (BAG) ×1 IMPLANT
TRANSDUCER W/STOPCOCK (MISCELLANEOUS) ×2 IMPLANT
TUBING CIL FLEX 10 FLL-RA (TUBING) ×2 IMPLANT

## 2020-01-02 NOTE — Progress Notes (Deleted)
[place] for IV heparin Indication: chest pain/ACS  Allergies  Allergen Reactions  . Lisinopril Swelling    Angioedema in 2020    Patient Measurements: Height: 5' 10.5" (179.1 cm) Weight: 57.6 kg (126 lb 15.8 oz) IBW/kg (Calculated) : 74.15 Heparin Dosing Weight: TBW  Vital Signs: Temp: 98.3 F (36.8 C) (10/12 0800) Temp Source: Oral (10/12 0800) BP: 98/62 (10/12 1026) Pulse Rate: 74 (10/12 1026)  Labs: Recent Labs    01/17/2020 0151 01/19/2020 0151 01/07/2020 0552 01/03/2020 0835 12/25/2019 1236 01/04/2020 1817 01/21/2020 0419  HGB 16.0   < > 12.7*  --   --   --  13.7  HCT 51.1  --  42.2  --   --   --  43.2  PLT 273  --  207  --   --   --  220  LABPROT  --   --   --   --   --   --  14.1  INR  --   --   --   --   --   --  1.1  HEPARINUNFRC  --   --   --   --   --  0.10* 0.29*  CREATININE 0.90   < > 0.90  --  1.14  --  1.17  TROPONINIHS  --   --  1,187* 4,615*  --   --   --    < > = values in this interval not displayed.    Estimated Creatinine Clearance: 48.5 mL/min (by C-G formula based on SCr of 1.17 mg/dL).   Medical History: Past Medical History:  Diagnosis Date  . Diverticulosis   . Hyperlipidemia   . Hypertension   . Internal hemorrhoids   . PVD (peripheral vascular disease) (HCC)     Medications:  Medications Prior to Admission  Medication Sig Dispense Refill Last Dose  . amLODipine (NORVASC) 10 MG tablet Take 10 mg by mouth daily.   12/31/2019 at Unknown time  . aspirin 325 MG tablet Take 325 mg by mouth daily.   12/31/2019 at Unknown time  . atorvastatin (LIPITOR) 80 MG tablet Take 40 mg by mouth at bedtime.    12/30/2019 at Unknown time  . Cholecalciferol 50 MCG (2000 UT) TABS Take 4,000 Units by mouth daily.   12/31/2019 at Unknown time  . clopidogrel (PLAVIX) 75 MG tablet Take 75 mg by mouth at bedtime.    12/30/2019  . tadalafil (CIALIS) 10 MG tablet Take 5 mg by mouth daily as needed for erectile dysfunction.   > 1  month   Scheduled:  . aspirin EC  81 mg Oral Daily  . atorvastatin  80 mg Oral Daily  . chlorhexidine gluconate (MEDLINE KIT)  15 mL Mouth Rinse BID  . Chlorhexidine Gluconate Cloth  6 each Topical Daily  . folic acid  1 mg Oral Daily  . furosemide  40 mg Intravenous Q12H  . influenza vaccine adjuvanted  0.5 mL Intramuscular Tomorrow-1000  . mouth rinse  15 mL Mouth Rinse q12n4p  . multivitamin with minerals  1 tablet Oral Daily  . sodium chloride flush  3 mL Intravenous Q12H  . thiamine  100 mg Oral Daily   Or  . thiamine  100 mg Intravenous Daily   Infusions:  . sodium chloride 250 mL (01/17/2020 1049)  . sodium chloride    . heparin    . nitroGLYCERIN Stopped (01/10/2020 1829)    Assessment: 89 yoM with no prior cardiac  history but PMH HTN/HLD, PAD, tobacco & EtOH, admitted for acute SOB. Found to have NSTEMI with acutely reduced LVEF. Pharmacy to dose IV heparin.  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 Plan to restart heparin 8 hours after sheath removal (documented on 10/12@1012 ). Hgb 13.7, plt 220. No s/sx of bleeding.   Will restart at previous rate of 1100 units/hr prior to cath.   Goal of Therapy: Heparin level 0.3-0.7 units/ml Monitor platelets by anticoagulation protocol: Yes  Plan:  Restart heparin rate at 1100 units/hr on 10/12@1815   Recheck HL in 8 hours  Daily CBC, daily heparin level once stable  Monitor for signs of bleeding or thrombosis  F/u CTVS evaluation  Antonietta Jewel, PharmD, [place] Pharmacist  Phone: [phone] 12/27/2019 12:08 PM  Please check AMION for all [place] phone numbers After 10:00 PM, call [place] [phone]

## 2020-01-02 NOTE — TOC Initial Note (Signed)
Transition of Care [facility]) - Initial/Assessment Note    Patient Details  Name: Adrian Phillips MRN: [id number] Date of Birth: 1950-04-26  Transition of Care [facility]) CM/SW Contact:    Golda Acre, RN Phone Number: 01/20/2020, 8:25 AM  Clinical Narrative:                 a 69 y.o. male with history of hypertension, hyperlipidemia, peripheral vascular disease, ongoing tobacco and alcohol abuse presents to the ER because of sudden onset of worsening shortness of breath since last night.  Denies any chest pain productive cough fever chills.  Has been noticing increasing lower extremity edema for the last couple of weeks.  Patient states he has not missed his home medication including antihypertensives and dyslipidemia medications.  ED Course: In the ER patient was acutely short of breath had to be placed on BiPAP with chest x-ray showing congestion concerning for CHF.  EKG shows sinus tachycardia with LVH.  Blood pressures markedly elevated with systolic blood pressure 192 diastolic 101 patient was given Lasix 80 mg IV and nitroglycerin infusion.  on 2l/02 via [place] this am., wcb 15.8, iv heparin and iv ntg on going. Following for progression and toc needs. Expected Discharge Plan: Home/Self Care Barriers to Discharge: Barriers Unresolved (comment)   Patient Goals and CMS Choice Patient states their goals for this hospitalization and ongoing recovery are:: to go back home and get better CMS Medicare.gov Compare Post Acute Care list provided to:: Patient    Expected Discharge Plan and Services Expected Discharge Plan: Home/Self Care   Discharge Planning Services: CM Consult   Living arrangements for the past 2 months: Single Family Home                                      Prior Living Arrangements/Services Living arrangements for the past 2 months: Single Family Home Lives with:: Self Patient language and need for interpreter reviewed:: Yes Do you feel safe going  back to the place where you live?: Yes      Need for Family Participation in Patient Care: Yes (Comment) Care giver support system in place?: Yes (comment)   Criminal Activity/Legal Involvement Pertinent to Current Situation/Hospitalization: No - Comment as needed  Activities of Daily Living Home Assistive Devices/Equipment: Eyeglasses ADL Screening (condition at time of admission) Patient's cognitive ability adequate to safely complete daily activities?: Yes Is the patient deaf or have difficulty hearing?: No Does the patient have difficulty seeing, even when wearing glasses/contacts?: No Does the patient have difficulty concentrating, remembering, or making decisions?: No Patient able to express need for assistance with ADLs?: Yes Does the patient have difficulty dressing or bathing?: No Independently performs ADLs?: Yes (appropriate for developmental age) Does the patient have difficulty walking or climbing stairs?: No Weakness of Legs: None Weakness of Arms/Hands: None  Permission Sought/Granted                  Emotional Assessment Appearance:: Appears stated age Attitude/Demeanor/Rapport: Engaged Affect (typically observed): Calm Orientation: : Oriented to Place, Oriented to Self, Oriented to  Time, Oriented to Situation Alcohol / Substance Use: Illicit Drugs Psych Involvement: No (comment)  Admission diagnosis:  Acute pulmonary edema (HCC) [J81.0] Acute respiratory failure with hypoxia (HCC) [J96.01] Patient Active Problem List   Diagnosis Date Noted  . Acute respiratory failure with hypoxia (HCC) 01/21/2020  . Hypertensive urgency 12/30/2019  . Acute CHF (  congestive heart failure) (HCC) 01/25/2020  . Tobacco abuse January 25, 2020  . Benign hypertension 08/28/2019  . Positive FIT (fecal immunochemical test) 08/28/2019   PCP:  Jana Hakim, MD Pharmacy:   [facility] - [place], [place] - [address] [place]  [address] Phone: [phone] Fax: [phone]     Social Determinants of Health (SDOH) Interventions    Readmission Risk Interventions No flowsheet data found.

## 2020-01-02 NOTE — H&P (View-Only) (Signed)
Progress Note  Patient Name: Adrian Phillips Date of Encounter: 12/22/2019  [facility] Cardiologist: Jenkins Rouge, MD   Subjective   No chest pain no SOB, BP was low but increasing.  Inpatient Medications    Scheduled Meds:  aspirin EC  81 mg Oral Daily   atorvastatin  80 mg Oral Daily   chlorhexidine gluconate (MEDLINE KIT)  15 mL Mouth Rinse BID   Chlorhexidine Gluconate Cloth  6 each Topical Daily   clopidogrel  75 mg Oral Daily   folic acid  1 mg Oral Daily   furosemide  40 mg Intravenous Q12H   influenza vaccine adjuvanted  0.5 mL Intramuscular Tomorrow-1000   mouth rinse  15 mL Mouth Rinse q12n4p   multivitamin with minerals  1 tablet Oral Daily   sacubitril-valsartan  1 tablet Oral BID   sodium chloride flush  3 mL Intravenous Q12H   thiamine  100 mg Oral Daily   Or   thiamine  100 mg Intravenous Daily   Continuous Infusions:  sodium chloride     sodium chloride 10 mL/hr at 12/28/2019 0602   sodium chloride 250 mL (01/16/2020 1049)   heparin 1,100 Units/hr (01/16/2020 0543)   nitroGLYCERIN Stopped (12/24/2019 5400)   PRN Meds: sodium chloride, sodium chloride, acetaminophen **OR** acetaminophen, LORazepam **OR** LORazepam, sodium chloride flush   Vital Signs    Vitals:   01/13/2020 0300 12/22/2019 0500 12/27/2019 0556 01/21/2020 0600  BP: 107/62 (!) 94/59  (!) 99/54  Pulse: 87 80  84  Resp: 18 (!) 25  20  Temp:   98 F (36.7 C)   TempSrc:   Oral   SpO2: 99% 99%  99%  Weight:  57.6 kg    Height:        Intake/Output Summary (Last 24 hours) at 01/04/2020 0802 Last data filed at 01/12/2020 0700 Gross per 24 hour  Intake 1037.68 ml  Output 2300 ml  Net -1262.32 ml   Last 3 Weights 12/27/2019 12/29/2019 12/31/2019  Weight (lbs) 126 lb 15.8 oz 127 lb 3.3 oz 149 lb  Weight (kg) 57.6 kg 57.7 kg 67.586 kg      Telemetry    3 long runs of NSVT 20 beats, 15 beats and 23 beats otherwise SR - Personally Reviewed  ECG    No new - Personally  Reviewed  Physical Exam   GEN: No acute distress.  Able to lie at 20 degrees without SOB Neck: mild JVD Cardiac: RRR, no murmurs, rubs, or gallops.  Respiratory: Clear to auscultation bilaterally. GI: Soft, nontender, non-distended  MS: No edema; No deformity. Neuro:  Nonfocal  Psych: Normal affect   Labs    High Sensitivity Troponin:   Recent Labs  Lab 12/05/19 1719 01/05/2020 0552 01/12/2020 0835  TROPONINIHS 11 1,187* 4,615*      Chemistry Recent Labs  Lab 12/24/2019 0552 12/29/2019 1236 01/13/2020 0419  NA 140 138 141  K 5.1 4.2 4.5  CL 107 101 103  CO2 23 26 30   GLUCOSE 136* 157* 132*  BUN 15 18 27*  CREATININE 0.90 1.14 1.17  CALCIUM 8.4* 9.2 8.5*  PROT 6.6  --   --   ALBUMIN 3.6  --   --   AST 72*  --   --   ALT 37  --   --   ALKPHOS 99  --   --   BILITOT 0.5  --   --   GFRNONAA >60 >60 >60  ANIONGAP 10 11 8  Hematology Recent Labs  Lab 12/30/2019 0151 01/07/2020 0552 12/30/2019 0419  WBC 19.2* 12.1* 15.8*  RBC 5.44 4.52 4.82  HGB 16.0 12.7* 13.7  HCT 51.1 42.2 43.2  MCV 93.9 93.4 89.6  MCH 29.4 28.1 28.4  MCHC 31.3 30.1 31.7  RDW 14.8 14.6 14.6  PLT 273 207 220    BNP Recent Labs  Lab 01/03/2020 0151  BNP 269.4*     DDimer  Recent Labs  Lab 01/07/2020 0552  DDIMER 0.94*     Radiology    DG Chest Port 1 View  Result Date: 01/21/2020 CLINICAL DATA:  Dyspnea EXAM: PORTABLE CHEST 1 VIEW COMPARISON:  12/05/2019 FINDINGS: The lungs are symmetrically well expanded. Mild cardiomegaly is again noted. There is interval development of mild bilateral mid and lower lung zone airspace and interstitial infiltrate most in keeping with mild pulmonary edema, likely cardiogenic in nature. No pneumothorax or pleural effusion. No acute bone abnormality. IMPRESSION: Interval development of mild cardiogenic failure. Electronically Signed   By: Fidela Salisbury MD   On: 01/13/2020 02:29   ECHOCARDIOGRAM COMPLETE  Result Date: 12/29/2019    ECHOCARDIOGRAM REPORT    Patient Name:   Adrian Phillips Date of Exam: 12/29/2019 Medical Rec #:  [id number]             Height:       70.0 in Accession #:    [id number]            Weight:       149.0 lb Date of Birth:  08-Dec-1950             BSA:          1.842 m Patient Age:    69 years              BP:           113/84 mmHg Patient Gender: M                     HR:           91 bpm. Exam Location:  Inpatient Procedure: 2D Echo, Cardiac Doppler, Color Doppler and Intracardiac            Opacification Agent STAT ECHO Indications:    Congestive Heart Failure 428.0 / I50.9  History:        Patient has no prior history of Echocardiogram examinations.                 CHF; Risk Factors:Current Smoker, Hypertension and Dyslipidemia.                 PVD.  Sonographer:    Vickie Epley RDCS Referring Phys: [place]  1. Left ventricular ejection fraction, by estimation, is <20%. The left ventricle has severely decreased function. The left ventricle demonstrates global hypokinesis. The left ventricular internal cavity size was moderately dilated. Left ventricular diastolic parameters are consistent with Grade II diastolic dysfunction (pseudonormalization). Elevated left ventricular end-diastolic pressure.  2. Right ventricular systolic function is normal. The right ventricular size is normal. There is mildly elevated pulmonary artery systolic pressure.  3. Left atrial size was mildly dilated.  4. The mitral valve is normal in structure. Moderate mitral valve regurgitation.  5. The aortic valve is tricuspid. Aortic valve regurgitation is not visualized. No aortic stenosis is present.  6. The inferior vena cava is dilated in size with <50% respiratory variability, suggesting right atrial pressure of 15 mmHg.  Comparison(s): No prior Echocardiogram. Conclusion(s)/Recommendation(s): Severe reduction in LVEF. Communicated to Dr. Lupita Leash and Cecilie Kicks via secure chat. FINDINGS  Left Ventricle: LV thrombus excluded by definity  contrast. Left ventricular ejection fraction, by estimation, is <20%. The left ventricle has severely decreased function. The left ventricle demonstrates global hypokinesis. Definity contrast agent was given IV to delineate the left ventricular endocardial borders. The left ventricular internal cavity size was moderately dilated. There is no left ventricular hypertrophy. Left ventricular diastolic parameters are consistent with Grade II diastolic dysfunction (pseudonormalization). Elevated left ventricular end-diastolic pressure. Right Ventricle: The right ventricular size is normal. No increase in right ventricular wall thickness. Right ventricular systolic function is normal. There is mildly elevated pulmonary artery systolic pressure. The tricuspid regurgitant velocity is 2.37  m/s, and with an assumed right atrial pressure of 15 mmHg, the estimated right ventricular systolic pressure is 62.1 mmHg. Left Atrium: Left atrial size was mildly dilated. Right Atrium: Right atrial size was normal in size. Pericardium: There is no evidence of pericardial effusion. Mitral Valve: The mitral valve is normal in structure. Moderate mitral valve regurgitation. Tricuspid Valve: The tricuspid valve is normal in structure. Tricuspid valve regurgitation is trivial. No evidence of tricuspid stenosis. Aortic Valve: The aortic valve is tricuspid. Aortic valve regurgitation is not visualized. No aortic stenosis is present. Pulmonic Valve: The pulmonic valve was grossly normal. Pulmonic valve regurgitation is mild. No evidence of pulmonic stenosis. Aorta: The aortic root is normal in size and structure. Venous: The inferior vena cava is dilated in size with less than 50% respiratory variability, suggesting right atrial pressure of 15 mmHg. IAS/Shunts: No atrial level shunt detected by color flow Doppler.  LEFT VENTRICLE PLAX 2D LVIDd:         5.70 cm      Diastology LVIDs:         5.10 cm      LV e' medial:    3.26 cm/s LV PW:          0.70 cm      LV E/e' medial:  17.2 LV IVS:        0.70 cm      LV e' lateral:   3.81 cm/s LVOT diam:     2.00 cm      LV E/e' lateral: 14.7 LV SV:         29 LV SV Index:   16 LVOT Area:     3.14 cm  LV Volumes (MOD) LV vol d, MOD A2C: 212.0 ml LV vol d, MOD A4C: 167.0 ml LV vol s, MOD A2C: 184.0 ml LV vol s, MOD A4C: 144.0 ml LV SV MOD A2C:     28.0 ml LV SV MOD A4C:     167.0 ml LV SV MOD BP:      24.0 ml RIGHT VENTRICLE RV S prime:     8.49 cm/s TAPSE (M-mode): 1.6 cm LEFT ATRIUM           Index       RIGHT ATRIUM          Index LA diam:      3.70 cm 2.01 cm/m  RA Area:     9.97 cm LA Vol (A2C): 41.4 ml 22.48 ml/m RA Volume:   14.70 ml 7.98 ml/m LA Vol (A4C): 43.6 ml 23.67 ml/m  AORTIC VALVE LVOT Vmax:   74.70 cm/s LVOT Vmean:  39.600 cm/s LVOT VTI:    0.092 m  AORTA Ao Root diam: 2.60 cm  MITRAL VALVE                 TRICUSPID VALVE MV Area (PHT): 3.91 cm      TR Peak grad:   22.5 mmHg MV Decel Time: 194 msec      TR Vmax:        237.00 cm/s MR Peak grad:    75.0 mmHg MR Mean grad:    46.0 mmHg   SHUNTS MR Vmax:         433.00 cm/s Systemic VTI:  0.09 m MR Vmean:        314.0 cm/s  Systemic Diam: 2.00 cm MR PISA:         1.01 cm MR PISA Eff ROA: 9 mm MR PISA Radius:  0.40 cm MV E velocity: 56.10 cm/s MV A velocity: 56.60 cm/s MV E/A ratio:  0.99 Buford Dresser MD Electronically signed by Buford Dresser MD Signature Date/Time: 01/19/2020/9:53:50 AM    Final     Cardiac Studies   Echo 12/24/2019 IMPRESSIONS     1. Left ventricular ejection fraction, by estimation, is <20%. The left  ventricle has severely decreased function. The left ventricle demonstrates  global hypokinesis. The left ventricular internal cavity size was  moderately dilated. Left ventricular  diastolic parameters are consistent with Grade II diastolic dysfunction  (pseudonormalization). Elevated left ventricular end-diastolic pressure.   2. Right ventricular systolic function is normal. The right ventricular   size is normal. There is mildly elevated pulmonary artery systolic  pressure.   3. Left atrial size was mildly dilated.   4. The mitral valve is normal in structure. Moderate mitral valve  regurgitation.   5. The aortic valve is tricuspid. Aortic valve regurgitation is not  visualized. No aortic stenosis is present.   6. The inferior vena cava is dilated in size with <50% respiratory  variability, suggesting right atrial pressure of 15 mmHg.   Comparison(s): No prior Echocardiogram.   Conclusion(s)/Recommendation(s): Severe reduction in LVEF. Communicated to  Dr. Lupita Leash and Cecilie Kicks via secure chat.   FINDINGS   Left Ventricle: LV thrombus excluded by definity contrast. Left  ventricular ejection fraction, by estimation, is <20%. The left ventricle  has severely decreased function. The left ventricle demonstrates global  hypokinesis. Definity contrast agent was  given IV to delineate the left ventricular endocardial borders. The left  ventricular internal cavity size was moderately dilated. There is no left  ventricular hypertrophy. Left ventricular diastolic parameters are  consistent with Grade II diastolic  dysfunction (pseudonormalization). Elevated left ventricular end-diastolic  pressure.   Right Ventricle: The right ventricular size is normal. No increase in  right ventricular wall thickness. Right ventricular systolic function is  normal. There is mildly elevated pulmonary artery systolic pressure. The  tricuspid regurgitant velocity is 2.37   m/s, and with an assumed right atrial pressure of 15 mmHg, the estimated  right ventricular systolic pressure is 06.0 mmHg.   Left Atrium: Left atrial size was mildly dilated.   Right Atrium: Right atrial size was normal in size.   Pericardium: There is no evidence of pericardial effusion.   Mitral Valve: The mitral valve is normal in structure. Moderate mitral  valve regurgitation.   Tricuspid Valve: The tricuspid valve is  normal in structure. Tricuspid  valve regurgitation is trivial. No evidence of tricuspid stenosis.   Aortic Valve: The aortic valve is tricuspid. Aortic valve regurgitation is  not visualized. No aortic stenosis is present.   Pulmonic Valve: The pulmonic valve  was grossly normal. Pulmonic valve  regurgitation is mild. No evidence of pulmonic stenosis.   Aorta: The aortic root is normal in size and structure.   Venous: The inferior vena cava is dilated in size with less than 50%  respiratory variability, suggesting right atrial pressure of 15 mmHg.   IAS/Shunts: No atrial level shunt detected by color flow Doppler.       Patient Profile     69 y.o. male with a hx of HTN, HLD, PAD, tobacco and ETOH use, admitted for acute CHF and respiratory failure on BiPAP and NSTEMI, EF 20%.   Assessment & Plan      Acute systolic HF. Acute Respiratory failure/cardiomyopathy   Pt had been taking meds for HTN, suddenly SOB sitting watching TV, mild indigestion type pain.  ddimer slightly elevated when pt able to tolerate CTA of chest he should have.  Echo with CM EF 20%. Once acute episode improved begin BB and ARB depending on echo and most likely will have Rt and Lt cardiac cath once respiratory status improved.    Neg 1812 since admit wt same.  Cr 1.17 K+ stable  Elevated troponin, NSTEMI. pk at last check 4615  NTg stopped, due to hypotension was started for CHF and BP. IV heparin infusing. Pt denies any bleeding HTN elevated on arrival but in acute distress, now hypotensive  NSVT with normal K+ and Mg+ and EF to 20%, would use amiodarone if continues.  HLD on lipitor 80 LDL is 57 and HDL 82  PAD with Rt fem -pop bypass with non reversed greater SVG 05/2003 by Dr Christean Grief on ASA and plavix  anaphylaxis to lisinopril in past--he rec'd one dose entresto without reaction. No meds currently except lasix with low BP.       For questions or updates, please contact [place] Please consult  [email or web address] for contact info under      Signed, Cecilie Kicks, NP  01/21/2020, 8:02 AM     Patient examined chart reviewed. Much improved from yesterday JVP elevated MR murmur apex with increased PMI. And right fem pop bypass  For right and left cath  Today given elevated troponin although echo with diffuse hypokinesis. Has had some NSVT but asymptomatic K/Mg are ok no amiodarone for Now. ? Reaction to lisinopril in past but tolerated first dose of entresto and long term would benefit from this Rx Will see what filling pressures Are. Would be best to keep at [facility] post cath as CHF team to see  Jenkins Rouge MD [facility]

## 2020-01-02 NOTE — Progress Notes (Signed)
[place] for IV heparin Indication: chest pain/ACS  Allergies  Allergen Reactions  . Lisinopril Swelling    Angioedema in 2020    Patient Measurements: Height: 5' 10.5" (179.1 cm) Weight: 57.6 kg (126 lb 15.8 oz) IBW/kg (Calculated) : 74.15 Heparin Dosing Weight: TBW  Vital Signs: Temp: 98.3 F (36.8 C) (10/12 0800) Temp Source: Oral (10/12 0800) BP: 98/62 (10/12 1026) Pulse Rate: 74 (10/12 1026)  Labs: Recent Labs    01/17/2020 0151 01/19/2020 0151 01/07/2020 0552 01/03/2020 0835 12/25/2019 1236 01/16/2020 1817 01/21/2020 0419  HGB 16.0   < > 12.7*  --   --   --  13.7  HCT 51.1  --  42.2  --   --   --  43.2  PLT 273  --  207  --   --   --  220  LABPROT  --   --   --   --   --   --  14.1  INR  --   --   --   --   --   --  1.1  HEPARINUNFRC  --   --   --   --   --  0.10* 0.29*  CREATININE 0.90   < > 0.90  --  1.14  --  1.17  TROPONINIHS  --   --  1,187* 4,615*  --   --   --    < > = values in this interval not displayed.    Estimated Creatinine Clearance: 48.5 mL/min (by C-G formula based on SCr of 1.17 mg/dL).   Medical History: Past Medical History:  Diagnosis Date  . Diverticulosis   . Hyperlipidemia   . Hypertension   . Internal hemorrhoids   . PVD (peripheral vascular disease) (HCC)     Medications:  Medications Prior to Admission  Medication Sig Dispense Refill Last Dose  . amLODipine (NORVASC) 10 MG tablet Take 10 mg by mouth daily.   12/31/2019 at Unknown time  . aspirin 325 MG tablet Take 325 mg by mouth daily.   12/31/2019 at Unknown time  . atorvastatin (LIPITOR) 80 MG tablet Take 40 mg by mouth at bedtime.    12/30/2019 at Unknown time  . Cholecalciferol 50 MCG (2000 UT) TABS Take 4,000 Units by mouth daily.   12/31/2019 at Unknown time  . clopidogrel (PLAVIX) 75 MG tablet Take 75 mg by mouth at bedtime.    12/30/2019  . tadalafil (CIALIS) 10 MG tablet Take 5 mg by mouth daily as needed for erectile dysfunction.   > 1  month   Scheduled:  . aspirin EC  81 mg Oral Daily  . atorvastatin  80 mg Oral Daily  . chlorhexidine gluconate (MEDLINE KIT)  15 mL Mouth Rinse BID  . Chlorhexidine Gluconate Cloth  6 each Topical Daily  . folic acid  1 mg Oral Daily  . furosemide  40 mg Intravenous Q12H  . influenza vaccine adjuvanted  0.5 mL Intramuscular Tomorrow-1000  . mouth rinse  15 mL Mouth Rinse q12n4p  . multivitamin with minerals  1 tablet Oral Daily  . sodium chloride flush  3 mL Intravenous Q12H  . thiamine  100 mg Oral Daily   Or  . thiamine  100 mg Intravenous Daily   Infusions:  . sodium chloride 250 mL (01/17/2020 1049)  . sodium chloride    . heparin    . nitroGLYCERIN Stopped (01/10/2020 1829)    Assessment: 89 yoM with no prior cardiac  history but PMH HTN/HLD, PAD, tobacco & EtOH, admitted for acute SOB. Found to have NSTEMI with acutely reduced LVEF 20%. Pharmacy to dose IV heparin. Heparin drip 1000 uts/hr HL 0.3 this am > rate increased to 1100 uts/hr and then patient taken to cath lab 3vCAD deciding PCI vs CABG Resume heparin 8hr after sheath removed (out at 1000 resume at 1800)  Follow up labs in am   Goal of Therapy: Heparin level 0.3-0.7 units/ml Monitor platelets by anticoagulation protocol: Yes  Plan:  Resume heparin rate at 1100 units/hr - resume at 6pm  Daily CBC, daily heparin level   Monitor for signs of bleeding or thrombosis   Bonnita Nasuti Pharm.D. CPP, BCPS Clinical Pharmacist [phone] 01/21/2020 11:48 AM

## 2020-01-02 NOTE — CV Procedure (Signed)
   Severe left ventricular systolic dysfunction.  EF 20% with LVEDP 40 mmHg and mean wedge 20 mmHg  Mild pulmonary hypertension with mean capillary wedge pressure of 20 mmHg and pulmonary vascular resistance  Severe calcified eccentric proximal LAD, moderate proximal circumflex (eccentric 70 %), 75% ostial first obtuse marginal, 99% OM 2, and chronically occluded RCA with left-to-right collaterals.  Distal RCA is tiny and may not be graftable.  Recommendations: Need to determine left ventricular viability.  Clearly surgical revascularization at his relatively young age with poor LV function would be the best long-term approach if he is not a prohibitive surgical candidate.  High risk PCI with atherectomy on the LAD and stenting could be done, likely requiring support.  Needs aggressive heart failure therapy in conjunction.  For the time being hold Plavix.

## 2020-01-02 NOTE — Progress Notes (Signed)
[facility] at bedside. Transferred to stretcher independently connected to portable monitor and oxygen at 2lpm via nasal cannula. Tolerated well. No s/s of distress at this time. Pt taken to [facility] for cath procedure accompanied by [facility] team.

## 2020-01-02 NOTE — Progress Notes (Addendum)
Progress Note  Patient Name: Adrian Phillips Date of Encounter: 01/04/2020  [facility] Cardiologist: Jenkins Rouge, MD   Subjective   No chest pain no SOB, BP was low but increasing.  Inpatient Medications    Scheduled Meds:  aspirin EC  81 mg Oral Daily   atorvastatin  80 mg Oral Daily   chlorhexidine gluconate (MEDLINE KIT)  15 mL Mouth Rinse BID   Chlorhexidine Gluconate Cloth  6 each Topical Daily   clopidogrel  75 mg Oral Daily   folic acid  1 mg Oral Daily   furosemide  40 mg Intravenous Q12H   influenza vaccine adjuvanted  0.5 mL Intramuscular Tomorrow-1000   mouth rinse  15 mL Mouth Rinse q12n4p   multivitamin with minerals  1 tablet Oral Daily   sacubitril-valsartan  1 tablet Oral BID   sodium chloride flush  3 mL Intravenous Q12H   thiamine  100 mg Oral Daily   Or   thiamine  100 mg Intravenous Daily   Continuous Infusions:  sodium chloride     sodium chloride 10 mL/hr at 12/22/2019 0602   sodium chloride 250 mL (01/07/2020 1049)   heparin 1,100 Units/hr (01/21/2020 0543)   nitroGLYCERIN Stopped (01/05/2020 3785)   PRN Meds: sodium chloride, sodium chloride, acetaminophen **OR** acetaminophen, LORazepam **OR** LORazepam, sodium chloride flush   Vital Signs    Vitals:   01/21/2020 0300 01/20/2020 0500 12/30/2019 0556 01/13/2020 0600  BP: 107/62 (!) 94/59  (!) 99/54  Pulse: 87 80  84  Resp: 18 (!) 25  20  Temp:   98 F (36.7 C)   TempSrc:   Oral   SpO2: 99% 99%  99%  Weight:  57.6 kg    Height:        Intake/Output Summary (Last 24 hours) at 12/29/2019 0802 Last data filed at 01/07/2020 0700 Gross per 24 hour  Intake 1037.68 ml  Output 2300 ml  Net -1262.32 ml   Last 3 Weights 01/20/2020 01/07/2020 12/31/2019  Weight (lbs) 126 lb 15.8 oz 127 lb 3.3 oz 149 lb  Weight (kg) 57.6 kg 57.7 kg 67.586 kg      Telemetry    3 long runs of NSVT 20 beats, 15 beats and 23 beats otherwise SR - Personally Reviewed  ECG    No new - Personally  Reviewed  Physical Exam   GEN: No acute distress.  Able to lie at 20 degrees without SOB Neck: mild JVD Cardiac: RRR, no murmurs, rubs, or gallops.  Respiratory: Clear to auscultation bilaterally. GI: Soft, nontender, non-distended  MS: No edema; No deformity. Neuro:  Nonfocal  Psych: Normal affect   Labs    High Sensitivity Troponin:   Recent Labs  Lab 12/05/19 1719 01/13/2020 0552 01/19/2020 0835  TROPONINIHS 11 1,187* 4,615*      Chemistry Recent Labs  Lab 12/30/2019 0552 01/09/2020 1236 01/06/2020 0419  NA 140 138 141  K 5.1 4.2 4.5  CL 107 101 103  CO2 23 26 30   GLUCOSE 136* 157* 132*  BUN 15 18 27*  CREATININE 0.90 1.14 1.17  CALCIUM 8.4* 9.2 8.5*  PROT 6.6  --   --   ALBUMIN 3.6  --   --   AST 72*  --   --   ALT 37  --   --   ALKPHOS 99  --   --   BILITOT 0.5  --   --   GFRNONAA >60 >60 >60  ANIONGAP 10 11 8  Hematology Recent Labs  Lab 12/29/2019 0151 12/22/2019 0552 12/31/2019 0419  WBC 19.2* 12.1* 15.8*  RBC 5.44 4.52 4.82  HGB 16.0 12.7* 13.7  HCT 51.1 42.2 43.2  MCV 93.9 93.4 89.6  MCH 29.4 28.1 28.4  MCHC 31.3 30.1 31.7  RDW 14.8 14.6 14.6  PLT 273 207 220    BNP Recent Labs  Lab 12/30/2019 0151  BNP 269.4*     DDimer  Recent Labs  Lab 12/31/2019 0552  DDIMER 0.94*     Radiology    DG Chest Port 1 View  Result Date: 01/15/2020 CLINICAL DATA:  Dyspnea EXAM: PORTABLE CHEST 1 VIEW COMPARISON:  12/05/2019 FINDINGS: The lungs are symmetrically well expanded. Mild cardiomegaly is again noted. There is interval development of mild bilateral mid and lower lung zone airspace and interstitial infiltrate most in keeping with mild pulmonary edema, likely cardiogenic in nature. No pneumothorax or pleural effusion. No acute bone abnormality. IMPRESSION: Interval development of mild cardiogenic failure. Electronically Signed   By: Fidela Salisbury MD   On: 01/19/2020 02:29   ECHOCARDIOGRAM COMPLETE  Result Date: 01/13/2020    ECHOCARDIOGRAM REPORT    Patient Name:   Adrian Phillips Date of Exam: 01/21/2020 Medical Rec #:  [id number]             Height:       70.0 in Accession #:    [id number]            Weight:       149.0 lb Date of Birth:  [birth year]             BSA:          1.842 m Patient Age:    69 years              BP:           113/84 mmHg Patient Gender: M                     HR:           91 bpm. Exam Location:  Inpatient Procedure: 2D Echo, Cardiac Doppler, Color Doppler and Intracardiac            Opacification Agent STAT ECHO Indications:    Congestive Heart Failure 428.0 / I50.9  History:        Patient has no prior history of Echocardiogram examinations.                 CHF; Risk Factors:Current Smoker, Hypertension and Dyslipidemia.                 PVD.  Sonographer:    Vickie Epley RDCS Referring Phys: [place]  1. Left ventricular ejection fraction, by estimation, is <20%. The left ventricle has severely decreased function. The left ventricle demonstrates global hypokinesis. The left ventricular internal cavity size was moderately dilated. Left ventricular diastolic parameters are consistent with Grade II diastolic dysfunction (pseudonormalization). Elevated left ventricular end-diastolic pressure.  2. Right ventricular systolic function is normal. The right ventricular size is normal. There is mildly elevated pulmonary artery systolic pressure.  3. Left atrial size was mildly dilated.  4. The mitral valve is normal in structure. Moderate mitral valve regurgitation.  5. The aortic valve is tricuspid. Aortic valve regurgitation is not visualized. No aortic stenosis is present.  6. The inferior vena cava is dilated in size with <50% respiratory variability, suggesting right atrial pressure of 15 mmHg.  Comparison(s): No prior Echocardiogram. Conclusion(s)/Recommendation(s): Severe reduction in LVEF. Communicated to Dr. Lupita Leash and Cecilie Kicks via secure chat. FINDINGS  Left Ventricle: LV thrombus excluded by definity  contrast. Left ventricular ejection fraction, by estimation, is <20%. The left ventricle has severely decreased function. The left ventricle demonstrates global hypokinesis. Definity contrast agent was given IV to delineate the left ventricular endocardial borders. The left ventricular internal cavity size was moderately dilated. There is no left ventricular hypertrophy. Left ventricular diastolic parameters are consistent with Grade II diastolic dysfunction (pseudonormalization). Elevated left ventricular end-diastolic pressure. Right Ventricle: The right ventricular size is normal. No increase in right ventricular wall thickness. Right ventricular systolic function is normal. There is mildly elevated pulmonary artery systolic pressure. The tricuspid regurgitant velocity is 2.37  m/s, and with an assumed right atrial pressure of 15 mmHg, the estimated right ventricular systolic pressure is 03.8 mmHg. Left Atrium: Left atrial size was mildly dilated. Right Atrium: Right atrial size was normal in size. Pericardium: There is no evidence of pericardial effusion. Mitral Valve: The mitral valve is normal in structure. Moderate mitral valve regurgitation. Tricuspid Valve: The tricuspid valve is normal in structure. Tricuspid valve regurgitation is trivial. No evidence of tricuspid stenosis. Aortic Valve: The aortic valve is tricuspid. Aortic valve regurgitation is not visualized. No aortic stenosis is present. Pulmonic Valve: The pulmonic valve was grossly normal. Pulmonic valve regurgitation is mild. No evidence of pulmonic stenosis. Aorta: The aortic root is normal in size and structure. Venous: The inferior vena cava is dilated in size with less than 50% respiratory variability, suggesting right atrial pressure of 15 mmHg. IAS/Shunts: No atrial level shunt detected by color flow Doppler.  LEFT VENTRICLE PLAX 2D LVIDd:         5.70 cm      Diastology LVIDs:         5.10 cm      LV e' medial:    3.26 cm/s LV PW:          0.70 cm      LV E/e' medial:  17.2 LV IVS:        0.70 cm      LV e' lateral:   3.81 cm/s LVOT diam:     2.00 cm      LV E/e' lateral: 14.7 LV SV:         29 LV SV Index:   16 LVOT Area:     3.14 cm  LV Volumes (MOD) LV vol d, MOD A2C: 212.0 ml LV vol d, MOD A4C: 167.0 ml LV vol s, MOD A2C: 184.0 ml LV vol s, MOD A4C: 144.0 ml LV SV MOD A2C:     28.0 ml LV SV MOD A4C:     167.0 ml LV SV MOD BP:      24.0 ml RIGHT VENTRICLE RV S prime:     8.49 cm/s TAPSE (M-mode): 1.6 cm LEFT ATRIUM           Index       RIGHT ATRIUM          Index LA diam:      3.70 cm 2.01 cm/m  RA Area:     9.97 cm LA Vol (A2C): 41.4 ml 22.48 ml/m RA Volume:   14.70 ml 7.98 ml/m LA Vol (A4C): 43.6 ml 23.67 ml/m  AORTIC VALVE LVOT Vmax:   74.70 cm/s LVOT Vmean:  39.600 cm/s LVOT VTI:    0.092 m  AORTA Ao Root diam: 2.60 cm  MITRAL VALVE                 TRICUSPID VALVE MV Area (PHT): 3.91 cm      TR Peak grad:   22.5 mmHg MV Decel Time: 194 msec      TR Vmax:        237.00 cm/s MR Peak grad:    75.0 mmHg MR Mean grad:    46.0 mmHg   SHUNTS MR Vmax:         433.00 cm/s Systemic VTI:  0.09 m MR Vmean:        314.0 cm/s  Systemic Diam: 2.00 cm MR PISA:         1.01 cm MR PISA Eff ROA: 9 mm MR PISA Radius:  0.40 cm MV E velocity: 56.10 cm/s MV A velocity: 56.60 cm/s MV E/A ratio:  0.99 Buford Dresser MD Electronically signed by Buford Dresser MD Signature Date/Time: 12/22/2019/9:53:50 AM    Final     Cardiac Studies   Echo 12/29/2019 IMPRESSIONS     1. Left ventricular ejection fraction, by estimation, is <20%. The left  ventricle has severely decreased function. The left ventricle demonstrates  global hypokinesis. The left ventricular internal cavity size was  moderately dilated. Left ventricular  diastolic parameters are consistent with Grade II diastolic dysfunction  (pseudonormalization). Elevated left ventricular end-diastolic pressure.   2. Right ventricular systolic function is normal. The right ventricular   size is normal. There is mildly elevated pulmonary artery systolic  pressure.   3. Left atrial size was mildly dilated.   4. The mitral valve is normal in structure. Moderate mitral valve  regurgitation.   5. The aortic valve is tricuspid. Aortic valve regurgitation is not  visualized. No aortic stenosis is present.   6. The inferior vena cava is dilated in size with <50% respiratory  variability, suggesting right atrial pressure of 15 mmHg.   Comparison(s): No prior Echocardiogram.   Conclusion(s)/Recommendation(s): Severe reduction in LVEF. Communicated to  Dr. Lupita Leash and Cecilie Kicks via secure chat.   FINDINGS   Left Ventricle: LV thrombus excluded by definity contrast. Left  ventricular ejection fraction, by estimation, is <20%. The left ventricle  has severely decreased function. The left ventricle demonstrates global  hypokinesis. Definity contrast agent was  given IV to delineate the left ventricular endocardial borders. The left  ventricular internal cavity size was moderately dilated. There is no left  ventricular hypertrophy. Left ventricular diastolic parameters are  consistent with Grade II diastolic  dysfunction (pseudonormalization). Elevated left ventricular end-diastolic  pressure.   Right Ventricle: The right ventricular size is normal. No increase in  right ventricular wall thickness. Right ventricular systolic function is  normal. There is mildly elevated pulmonary artery systolic pressure. The  tricuspid regurgitant velocity is 2.37   m/s, and with an assumed right atrial pressure of 15 mmHg, the estimated  right ventricular systolic pressure is 37.3 mmHg.   Left Atrium: Left atrial size was mildly dilated.   Right Atrium: Right atrial size was normal in size.   Pericardium: There is no evidence of pericardial effusion.   Mitral Valve: The mitral valve is normal in structure. Moderate mitral  valve regurgitation.   Tricuspid Valve: The tricuspid valve is  normal in structure. Tricuspid  valve regurgitation is trivial. No evidence of tricuspid stenosis.   Aortic Valve: The aortic valve is tricuspid. Aortic valve regurgitation is  not visualized. No aortic stenosis is present.   Pulmonic Valve: The pulmonic valve  was grossly normal. Pulmonic valve  regurgitation is mild. No evidence of pulmonic stenosis.   Aorta: The aortic root is normal in size and structure.   Venous: The inferior vena cava is dilated in size with less than 50%  respiratory variability, suggesting right atrial pressure of 15 mmHg.   IAS/Shunts: No atrial level shunt detected by color flow Doppler.       Patient Profile     69 y.o. male with a hx of HTN, HLD, PAD, tobacco and ETOH use, admitted for acute CHF and respiratory failure on BiPAP and NSTEMI, EF 20%.   Assessment & Plan      Acute systolic HF. Acute Respiratory failure/cardiomyopathy   Pt had been taking meds for HTN, suddenly SOB sitting watching TV, mild indigestion type pain.  ddimer slightly elevated when pt able to tolerate CTA of chest he should have.  Echo with CM EF 20%. Once acute episode improved begin BB and ARB depending on echo and most likely will have Rt and Lt cardiac cath once respiratory status improved.    Neg 1812 since admit wt same.  Cr 1.17 K+ stable  Elevated troponin, NSTEMI. pk at last check 4615  NTg stopped, due to hypotension was started for CHF and BP. IV heparin infusing. Pt denies any bleeding HTN elevated on arrival but in acute distress, now hypotensive  NSVT with normal K+ and Mg+ and EF to 20%, would use amiodarone if continues.  HLD on lipitor 80 LDL is 57 and HDL 82  PAD with Rt fem -pop bypass with non reversed greater SVG 05/2003 by Dr Christean Grief on ASA and plavix  anaphylaxis to lisinopril in past--he rec'd one dose entresto without reaction. No meds currently except lasix with low BP.       For questions or updates, please contact [place] Please consult  [email or web address] for contact info under      Signed, Cecilie Kicks, NP  01/21/2020, 8:02 AM     Patient examined chart reviewed. Much improved from yesterday JVP elevated MR murmur apex with increased PMI. And right fem pop bypass  For right and left cath  Today given elevated troponin although echo with diffuse hypokinesis. Has had some NSVT but asymptomatic K/Mg are ok no amiodarone for Now. ? Reaction to lisinopril in past but tolerated first dose of entresto and long term would benefit from this Rx Will see what filling pressures Are. Would be best to keep at [facility] post cath as CHF team to see  Jenkins Rouge MD [facility]

## 2020-01-02 NOTE — Progress Notes (Addendum)
[address]       Adrian [facility],Adrian Phillips [address]             [phone]        [facility] [place] Medical Record #[id number] Date of Birth: [birth year]  Referring: No ref. provider found Primary Care: Adrian Laura, MD Primary Cardiologist:Adrian Trigueros [name], MD  Chief Complaint:    Chief Complaint  Patient presents with  . Shortness of Breath    History of Present Illness: We are asked to see this 69 year old gentleman in cardiothoracic surgical consultation for consideration of revascularization.   The patient has a history of multiple cardiac risk factors and comorbidities who was admitted through the emergency department on 01/20/2020 for acute shortness of breath.  He was found to have elevated troponins as well as an NSVT.  The patient developed shortness of breath while watching a football game and EMS was called.  He notes that he has been having some symptoms of lower extremity edema but denies shortness of breath or chest pain.  He did have some occasional indigestion type discomfort.  Initial EKG showed ST at 130 with LVH and T wave inversions in V5 and 6 with Q waves in V1 through 2 they were all present in September 2001.  His longest episode of nonsustained VT was 20 beats.  BNP was noted to be 269.  Peak high-sensitivity troponin was 4615 (NSTEMI).  Echocardiogram, see full report below showed ejection fraction by estimation of less than 20%.  With severely decreased LV function.  A cardiac MR is planned for viability.  Cardiac catheterization, see full report listed below show severe three-vessel disease with total occlusion of the mid RCA with left-to-right collaterals, high-grade calcified proximal LAD, severe circumflex disease with a eccentric proximal 70% stenosis, 80% first obtuse marginal, and 90% third obtuse marginal.  The left main is widely patent.  There is LV systolic dysfunction with EF 25%.  LVEDP was measured at 40 mmHg.  There is mild  pulmonary hypertension, WHO group 2.  Mean wedge was 20 mmHg.  PVR 2.1 Woods unit.  Cardiac risk factors include hypertension, hyperlipidemia and PAD.  He is status post right femoropopliteal bypass and has been on aspirin and Plavix.  He is also a long-term smoker who uses approximately 1/2 pack/day currently.   Current Activity/ Functional Status: Patient is independent with mobility/ambulation, transfers, ADL's, IADL's.   Zubrod Score: At the time of surgery this patient's most appropriate activity status/level should be described as: []     0    Normal activity, no symptoms [x]     1    Restricted in physical strenuous activity but ambulatory, able to do out light work []     2    Ambulatory and capable of self care, unable to do work activities, up and about                 more than 50%  Of the time                            []     3    Only limited self care, in bed greater than 50% of waking hours []     4    Completely disabled, no self care, confined to bed or chair []     5    Moribund  Past Medical History:  Diagnosis Date  . Diverticulosis   . Hyperlipidemia   .  Hypertension   . Internal hemorrhoids   . PVD (peripheral vascular disease) ([facility])     Past Surgical History:  Procedure Laterality Date  . COLONOSCOPY  12/13/2008   Dr [facility] at [facility]  . INGUINAL HERNIA REPAIR Right   . RIGHT/LEFT HEART CATH AND CORONARY ANGIOGRAPHY N/A 01/13/2020   Procedure: RIGHT/LEFT HEART CATH AND CORONARY ANGIOGRAPHY;  Surgeon: Adrian Crome, MD;  Location: [facility];  Service: Cardiovascular;  Laterality: N/A;    Social History   Tobacco Use  Smoking Status Current Every Day Smoker  . Types: Cigarettes  Smokeless Tobacco Never Used    Social History   Substance and Sexual Activity  Alcohol Use Not Currently     Allergies  Allergen Reactions  . Lisinopril Swelling    Angioedema in 2020    Current Facility-Administered Medications  Medication  Dose Route Frequency Provider Last Rate Last Admin  . 0.9 %  sodium chloride infusion   Intravenous PRN Adrian Pert, MD 10 mL/hr at 01/21/2020 1049 250 mL at 01/10/2020 1049  . 0.9 %  sodium chloride infusion  250 mL Intravenous PRN Adrian Crome, MD      . acetaminophen (TYLENOL) tablet 650 mg  650 mg Oral Q6H PRN Adrian Patience, MD       Or  . acetaminophen (TYLENOL) suppository 650 mg  650 mg Rectal Q6H PRN Adrian Patience, MD      . aspirin EC tablet 81 mg  81 mg Oral Daily Adrian Patience, MD      . atorvastatin (LIPITOR) tablet 80 mg  80 mg Oral Daily Adrian Patience, MD   80 mg at 12/31/2019 1044  . chlorhexidine gluconate (MEDLINE KIT) (PERIDEX) 0.12 % solution 15 mL  15 mL Mouth Rinse BID Kc, Ramesh, MD   15 mL at 01/03/2020 2100  . Chlorhexidine Gluconate Cloth 2 % PADS 6 each  6 each Topical Daily Adrian Pert, MD   6 each at 01/19/2020 1044  . digoxin (LANOXIN) tablet 0.125 mg  0.125 mg Oral Daily Bensimhon, Shaune Pascal, MD      . folic acid (FOLVITE) tablet 1 mg  1 mg Oral Daily Adrian Patience, MD   1 mg at 01/10/2020 1045  . furosemide (LASIX) injection 40 mg  40 mg Intravenous Q12H Adrian Patience, MD   40 mg at 01/19/2020 2694  . heparin ADULT infusion 100 units/mL (25000 units/233m sodium chloride 0.45%)  1,100 Units/hr Intravenous Continuous Bensimhon, DShaune Pascal MD      . influenza vaccine adjuvanted (FLUAD) injection 0.5 mL  0.5 mL Intramuscular Tomorrow-1000 Kc, Ramesh, MD      . LORazepam (ATIVAN) tablet 1-4 mg  1-4 mg Oral Q1H PRN KRise Patience MD       Or  . LORazepam (ATIVAN) injection 1-4 mg  1-4 mg Intravenous Q1H PRN KRise Patience MD      . MEDLINE mouth rinse  15 mL Mouth Rinse q12n4p Kc, Ramesh, MD      . multivitamin with minerals tablet 1 tablet  1 tablet Oral Daily KRise Patience MD   1 tablet at 12/23/2019 1046  . nitroGLYCERIN 50 mg in dextrose 5 % 250 mL (0.2 mg/mL) infusion  0-200 mcg/min Intravenous Continuous KRise Patience MD   Stopped at 01/07/2020 [phone] . sodium chloride flush (NS) 0.9 % injection 3 mL  3 mL Intravenous Q12H IIsaiah Serge NP   3 mL  at 01/07/2020 2105  . thiamine tablet 100 mg  100 mg Oral Daily Adrian Patience, MD   100 mg at 01/10/2020 1046   Or  . thiamine (B-1) injection 100 mg  100 mg Intravenous Daily Adrian Patience, MD        Medications Prior to Admission  Medication Sig Dispense Refill Last Dose  . amLODipine (NORVASC) 10 MG tablet Take 10 mg by mouth daily.   12/31/2019 at Unknown time  . aspirin 325 MG tablet Take 325 mg by mouth daily.   12/31/2019 at Unknown time  . atorvastatin (LIPITOR) 80 MG tablet Take 40 mg by mouth at bedtime.    12/30/2019 at Unknown time  . Cholecalciferol 50 MCG (2000 UT) TABS Take 4,000 Units by mouth daily.   12/31/2019 at Unknown time  . clopidogrel (PLAVIX) 75 MG tablet Take 75 mg by mouth at bedtime.    12/30/2019  . tadalafil (CIALIS) 10 MG tablet Take 5 mg by mouth daily as needed for erectile dysfunction.   > 1 month    Family History  Problem Relation Age of Onset  . Colon polyps Neg Hx   . Colon cancer Neg Hx      Review of Systems:   Review of Systems  Constitutional: Positive for diaphoresis. Negative for chills, fever, malaise/fatigue and weight loss.  HENT: Negative.   Eyes: Negative.   Respiratory: Positive for shortness of breath. Negative for cough, hemoptysis, sputum production and wheezing.   Cardiovascular: Positive for orthopnea and leg swelling. Negative for chest pain, palpitations, claudication and PND.  Gastrointestinal: Positive for heartburn. Negative for abdominal pain, blood in stool, constipation, diarrhea, melena, nausea and vomiting.  Genitourinary: Negative.   Musculoskeletal: Positive for joint pain. Negative for back pain, falls, myalgias and neck pain.  Skin: Negative.   Neurological: Negative for dizziness, tingling, tremors, sensory change, speech change, focal weakness, seizures, loss of  consciousness, weakness and headaches.  Endo/Heme/Allergies: Positive for environmental allergies. Negative for polydipsia. Does not bruise/bleed easily.  Psychiatric/Behavioral: Negative for depression, hallucinations, memory loss, substance abuse and suicidal ideas. The patient is not nervous/anxious and does not have insomnia.        Physical Exam: BP (P) 116/75 (BP Location: Right Arm)   Pulse 92   Temp (P) 98.3 F (36.8 C) (Oral)   Resp (!) 22   Ht 5' 10.5" (1.791 m)   Wt 57.6 kg   SpO2 95%   BMI 17.96 kg/m    General appearance: alert, cooperative and no distress Head: Normocephalic, without obvious abnormality, atraumatic Neck: no adenopathy, no carotid bruit, no JVD, supple, symmetrical, trachea midline and thyroid not enlarged, symmetric, no tenderness/mass/nodules Lymph nodes: Cervical, supraclavicular, and axillary nodes normal. Resp: clear to auscultation bilaterally Back: symmetric, no curvature. ROM normal. No CVA tenderness. Cardio: regular rate and rhythm, S1, S2 normal, no murmur, click, rub or gallop GI: soft, non-tender; bowel sounds normal; no masses,  no organomegaly Extremities: extremities normal, atraumatic, no cyanosis or edema and absent PT/PT on left , + DP on right Neurologic: Grossly normal Full dentures No carotid bruits   Diagnostic Studies & Laboratory data:     Recent Radiology Findings:   CARDIAC CATHETERIZATION  Result Date: 12/23/2019  Severe three-vessel coronary disease with total occlusion mid RCA with left-to-right collaterals (distal vessel is small and probably not graftable), high-grade calcified proximal LAD, severe circumflex disease with eccentric proximal 70% stenosis, 80% first obtuse marginal, and 90% third obtuse marginal.  The left main  is widely patent.  Known severe LV systolic dysfunction with EF 25%.  Findings are consistent with acute on chronic combined systolic and diastolic heart failure.  LVEDP is 40 mmHg.  Mild  pulmonary hypertension, WHO group 2.  Mean wedge 20 mmHg, PVR 2.1 Woods units. RECOMMENDATIONS:  Heart team debate concerning revascularization.  At his age with multivessel coronary disease, surgery would be a more durable approach.  High risk PCI on the calcified proximal LAD is also possible but would need to be done with support.  MRI viability study will help clarify potential approach.  Aggressive therapy of acute component of heart failure.  Until conversation with all involved relative to revascularization strategy, will hold Plavix.  Aspirin was started.  DG Chest Port 1 View  Result Date: 12/25/2019 CLINICAL DATA:  Dyspnea EXAM: PORTABLE CHEST 1 VIEW COMPARISON:  12/05/2019 FINDINGS: The lungs are symmetrically well expanded. Mild cardiomegaly is again noted. There is interval development of mild bilateral mid and lower lung zone airspace and interstitial infiltrate most in keeping with mild pulmonary edema, likely cardiogenic in nature. No pneumothorax or pleural effusion. No acute bone abnormality. IMPRESSION: Interval development of mild cardiogenic failure. Electronically Signed   By: Fidela Salisbury MD   On: 01/20/2020 02:29   ECHOCARDIOGRAM COMPLETE  Result Date: 12/26/2019    ECHOCARDIOGRAM REPORT   Patient Name:   Adrian [facility] Date of Exam: 01/05/2020 Medical Rec #:  [id number]             Height:       70.0 in Accession #:    [id number]            Weight:       149.0 lb Date of Birth:  [birth year]             BSA:          1.842 m Patient Age:    62 years              BP:           113/84 mmHg Patient Gender: M                     HR:           91 bpm. Exam Location:  Inpatient Procedure: 2D Echo, Cardiac Doppler, Color Doppler and Intracardiac            Opacification Agent STAT ECHO Indications:    Congestive Heart Failure 428.0 / I50.9  History:        Patient has no prior history of Echocardiogram examinations.                 CHF; Risk Factors:Current Smoker, Hypertension  and Dyslipidemia.                 PVD.  Sonographer:    Vickie Epley RDCS Referring Phys: [place]  1. Left ventricular ejection fraction, by estimation, is <20%. The left ventricle has severely decreased function. The left ventricle demonstrates global hypokinesis. The left ventricular internal cavity size was moderately dilated. Left ventricular diastolic parameters are consistent with Grade II diastolic dysfunction (pseudonormalization). Elevated left ventricular end-diastolic pressure.  2. Right ventricular systolic function is normal. The right ventricular size is normal. There is mildly elevated pulmonary artery systolic pressure.  3. Left atrial size was mildly dilated.  4. The mitral valve is normal in structure. Moderate mitral valve regurgitation.  5. The aortic valve  is tricuspid. Aortic valve regurgitation is not visualized. No aortic stenosis is present.  6. The inferior vena cava is dilated in size with <50% respiratory variability, suggesting right atrial pressure of 15 mmHg. Comparison(s): No prior Echocardiogram. Conclusion(s)/Recommendation(s): Severe reduction in LVEF. Communicated to Dr. Lupita Leash and Cecilie Kicks via secure chat. FINDINGS  Left Ventricle: LV thrombus excluded by definity contrast. Left ventricular ejection fraction, by estimation, is <20%. The left ventricle has severely decreased function. The left ventricle demonstrates global hypokinesis. Definity contrast agent was given IV to delineate the left ventricular endocardial borders. The left ventricular internal cavity size was moderately dilated. There is no left ventricular hypertrophy. Left ventricular diastolic parameters are consistent with Grade II diastolic dysfunction (pseudonormalization). Elevated left ventricular end-diastolic pressure. Right Ventricle: The right ventricular size is normal. No increase in right ventricular wall thickness. Right ventricular systolic function is normal. There is  mildly elevated pulmonary artery systolic pressure. The tricuspid regurgitant velocity is 2.37  m/s, and with an assumed right atrial pressure of 15 mmHg, the estimated right ventricular systolic pressure is 82.7 mmHg. Left Atrium: Left atrial size was mildly dilated. Right Atrium: Right atrial size was normal in size. Pericardium: There is no evidence of pericardial effusion. Mitral Valve: The mitral valve is normal in structure. Moderate mitral valve regurgitation. Tricuspid Valve: The tricuspid valve is normal in structure. Tricuspid valve regurgitation is trivial. No evidence of tricuspid stenosis. Aortic Valve: The aortic valve is tricuspid. Aortic valve regurgitation is not visualized. No aortic stenosis is present. Pulmonic Valve: The pulmonic valve was grossly normal. Pulmonic valve regurgitation is mild. No evidence of pulmonic stenosis. Aorta: The aortic root is normal in size and structure. Venous: The inferior vena cava is dilated in size with less than 50% respiratory variability, suggesting right atrial pressure of 15 mmHg. IAS/Shunts: No atrial level shunt detected by color flow Doppler.  LEFT VENTRICLE PLAX 2D LVIDd:         5.70 cm      Diastology LVIDs:         5.10 cm      LV e' medial:    3.26 cm/s LV PW:         0.70 cm      LV E/e' medial:  17.2 LV IVS:        0.70 cm      LV e' lateral:   3.81 cm/s LVOT diam:     2.00 cm      LV E/e' lateral: 14.7 LV SV:         29 LV SV Index:   16 LVOT Area:     3.14 cm  LV Volumes (MOD) LV vol d, MOD A2C: 212.0 ml LV vol d, MOD A4C: 167.0 ml LV vol s, MOD A2C: 184.0 ml LV vol s, MOD A4C: 144.0 ml LV SV MOD A2C:     28.0 ml LV SV MOD A4C:     167.0 ml LV SV MOD BP:      24.0 ml RIGHT VENTRICLE RV S prime:     8.49 cm/s TAPSE (M-mode): 1.6 cm LEFT ATRIUM           Index       RIGHT ATRIUM          Index LA diam:      3.70 cm 2.01 cm/m  RA Area:     9.97 cm LA Vol (A2C): 41.4 ml 22.48 ml/m RA Volume:   14.70 ml 7.98 ml/m LA Vol (  A4C): 43.6 ml 23.67 ml/m   AORTIC VALVE LVOT Vmax:   74.70 cm/s LVOT Vmean:  39.600 cm/s LVOT VTI:    0.092 m  AORTA Ao Root diam: 2.60 cm MITRAL VALVE                 TRICUSPID VALVE MV Area (PHT): 3.91 cm      TR Peak grad:   22.5 mmHg MV Decel Time: 194 msec      TR Vmax:        237.00 cm/s MR Peak grad:    75.0 mmHg MR Mean grad:    46.0 mmHg   SHUNTS MR Vmax:         433.00 cm/s Systemic VTI:  0.09 m MR Vmean:        314.0 cm/s  Systemic Diam: 2.00 cm MR PISA:         1.01 cm MR PISA Eff ROA: 9 mm MR PISA Radius:  0.40 cm MV E velocity: 56.10 cm/s MV A velocity: 56.60 cm/s MV E/A ratio:  0.99 Buford Dresser MD Electronically signed by Buford Dresser MD Signature Date/Time: 01/05/2020/9:53:50 AM    Final      I have independently reviewed the above radiologic studies and discussed with the patient   Recent Phillips Findings: Phillips Results  Component Value Date   WBC 15.8 (H) 01/09/2020   HGB 13.7 01/10/2020   HCT 43.2 12/27/2019   PLT 220 01/04/2020   GLUCOSE 132 (H) 01/06/2020   CHOL 144 12/31/2019   TRIG 26 12/31/2019   HDL 82 01/09/2020   LDLCALC 57 01/17/2020   ALT 37 12/28/2019   AST 72 (H) 01/21/2020   NA 141 12/31/2019   K 4.5 01/16/2020   CL 103 01/12/2020   CREATININE 1.17 12/23/2019   BUN 27 (H) 12/26/2019   CO2 30 12/31/2019   TSH 0.560 01/09/2020   INR 1.1 01/21/2020   HGBA1C 5.4 01/10/2020      Assessment / Plan: Non-STEMI with severe three-vessel coronary artery disease and markedly decreased LV function.     Hypertension Positive fecal immunochemical test Acute respiratory failure with hypoxia Hypertensive urgency Acute CHF Heavy tobacco abuse history Hyperlipidemia Peripheral vascular disease status post right femoropopliteal History of internal hemorrhoids History of hypertension  Plan: A MRI cardiac viability study is planned and this will help determine if surgical intervention is an option.  He will continue medical stabilization as per cardiology and primary  service.   I  spent 40 minutes counseling the patient face to face.   John Giovanni, PA-C 01/19/2020 3:49 PM  Patient examined, images of coronary arteriograms and echocardiogram as well as right heart cath data personally reviewed and discussed with Dr. Aundra Dubin for coordination of care.  70 year old smoker with ischemic cardiomyopathy presented with heart failure, interstitial edema on x-ray and echocardiogram showing EF less than 20% with moderate MR.  Coronary arteriograms demonstrate chronic occlusion of the RCA with poor distal target and high-grade proximal LAD stenosis and moderate proximal circumflex stenosis.  LVEDP 40.  Cardia index demonstrates low output, 1.9 and CVP normal with moderately elevated PA pressures and wedge.  He would benefit from surgical revascularization if his viability study is adequate.  Otherwise PCI of his LAD and circumflex would be his best therapy.  We will follow up with patient after viability study to help guide therapy. PFTs, carotid duplex, pending  Dahlia Byes MD

## 2020-01-02 NOTE — Plan of Care (Signed)

## 2020-01-02 NOTE — Progress Notes (Signed)
PROGRESS NOTE    Adrian Phillips  WLS:[id number] DOB: [birth year] DOA: 01/06/2020 PCP: Bea Laura, MD   Chief Complaint  Patient presents with  . Shortness of Breath    Brief Narrative: 69 year old male with hypertension, hyperlipidemia, PVD, ongoing tobacco abuse, alcohol abuse presented with worsening shortness of breath since 10/9 night.  Has been having increasing lower extremity edema for last couple of weeks.  In the ED acutely short of breath chest x-ray showed congestion concerning for CHF EKG with sinus tachycardia, LVH, was placed on BiPAP.  Blood pressure was hypotensive in 190s given IV Lasix and nitroglycerin infusion and oral meds were initiated.  Troponin came back positive cardiology was consulted.T-max 99.2, potassium 5.1, BNP 269 and troponin 1187, wbc 19,2k>12k Patient was found in acute systolic CHF with EF percent, non-ST elevation MI, hypoxic respiratory failure. Seen by cardiology, started on heparin drip w/ plan for cardiac catheter 10/21 at [facility]  Subjective: AAOx3,no CP or shortness of breath BP soft after lasix but now better in 100s On 2l [place]. Laying flat ' I am peeing good" Daughter at bedside   Assessment & Plan:  Acute systolic congestive heart : New onset with EF 20%, ongoing for cardiac cath.  CHF team will follow up and continue management as per cardiology with IV Lasix, Entresto-seems to be tolerating well although has allergy to acei- deferred to cards- who were notified by pharmacy 10/11.  Soft BP Following Lasix but seems to be improving  NSTEMI:Troponin:11>1187>4k consistent with NSTEMI:  Continue heparin drip, statin, Plavix, Lipitor 80 mg.  Also is started on Entresto per cardiology.  For cardiac cath today.  Denies chest pain and shortness of breath currently volume status improved   NSVT:Monitor potassium and magnesium.  Cardiology following, monitoring telemetry  Acute hypoxic respiratory failure due to CHF off bipapa , on 2l [place],  cont lasix, wean off o2 post cath  Hypertension emergency- off NTG drip-cont meds per cards.  Leukocytosis:resolved. likely from NSTEMI.  PVD with RLE bypass w.  Nonreversed R SVG 05/2003 by Dr. Oneida Alar. Cont on ASA, PLAVIX, statins No leg pain  Slightly positive D-dimer, at 0.9- ?etiology- discussed with cardiology Dr [name] 01/21/2020-advised to hold off on CTA chest  In the light of low ef to avoid contrast load given that he will need cardiac cath today. Now on heparin gtt for nstemi.   Tobacco abuse cessation advised.  Alcohol use denies drug abuse was drinking 2 beers and 30.  Discussed alcohol cessation.Smoking:  Nutrition: Diet Order            Diet NPO time specified Except for: Sips with Meds  Diet effective midnight                 Body mass index is 17.96 kg/m.  DVT prophylaxis: Heparin gtt Code Status:   Code Status: Full Code  Family Communication: plan of care discussed with patient  and his daughter at the bedside. Patient is being transferred to [facility] for cardiac cath and further treatment.  Status is: Inpatient Remains inpatient appropriate because:IV treatments appropriate due to intensity of illness or inability to take PO and Inpatient level of care appropriate due to severity of illness  Dispo: The patient is from: Home              Anticipated d/c is to: Home              Anticipated d/c date is: 3 days  Patient currently is not medically stable to d/c. Consultants:see note  Procedures:see note  Culture/Microbiology No results found for: SDES, SPECREQUEST, CULT, REPTSTATUS  Other culture-see note  Medications: Scheduled Meds: . aspirin EC  81 mg Oral Daily  . atorvastatin  80 mg Oral Daily  . chlorhexidine gluconate (MEDLINE KIT)  15 mL Mouth Rinse BID  . Chlorhexidine Gluconate Cloth  6 each Topical Daily  . clopidogrel  75 mg Oral Daily  . folic acid  1 mg Oral Daily  . furosemide  40 mg Intravenous Q12H  . influenza  vaccine adjuvanted  0.5 mL Intramuscular Tomorrow-1000  . mouth rinse  15 mL Mouth Rinse q12n4p  . multivitamin with minerals  1 tablet Oral Daily  . sacubitril-valsartan  1 tablet Oral BID  . sodium chloride flush  3 mL Intravenous Q12H  . thiamine  100 mg Oral Daily   Or  . thiamine  100 mg Intravenous Daily   Continuous Infusions: . sodium chloride    . sodium chloride 10 mL/hr at 12/30/2019 0800  . sodium chloride 250 mL (01/16/2020 1049)  . heparin 1,100 Units/hr (12/25/2019 0800)  . nitroGLYCERIN Stopped (12/27/2019 8677)    Antimicrobials: Anti-infectives (From admission, onward)   None     Objective: Vitals: Today's Vitals   01/05/2020 0600 01/15/2020 0700 01/16/2020 0800 12/22/2019 0815  BP: (!) 99/54 (!) 89/53 (!) 82/40 103/81  Pulse: 84 87 83 79  Resp: 20 (!) 33 (!) 21 20  Temp:      TempSrc:      SpO2: 99% 100% 99% 100%  Weight:      Height:      PainSc:   0-No pain     Intake/Output Summary (Last 24 hours) at 12/30/2019 0821 Last data filed at 01/18/2020 0800 Gross per 24 hour  Intake 1169.02 ml  Output 3100 ml  Net -1930.98 ml   Filed Weights   01/03/2020 0214 01/20/2020 1000 12/28/2019 0500  Weight: 67.6 kg 57.7 kg 57.6 kg   Weight change: -9.886 kg  Intake/Output from previous day: 10/11 0701 - 10/12 0700 In: 1037.7 [P.O.:720; I.V.:317.7] Out: 2300 [Urine:2300] Intake/Output this shift: Total I/O In: 131.3 [I.V.:131.3] Out: 800 [Urine:800]  Examination: General exam: AAOx3,NAD,weak appearing. HEENT:Oral mucosa moist,Ear/Nose WNL grossly,dentition normal. Respiratory system:Bilaterally clear,no wheezing or crackles,no use of accessory muscle, non tender. Cardiovascular system: S1 & S2 +, regular, No JVD. Gastrointestinal system: Abdomen soft, NT,ND, BS+. Nervous System:Alert, awake, moving extremities and grossly nonfocal Extremities: No edema, distal peripheral pulses palpable.  Skin: No rashes,no icterus. MSK: Normal muscle bulk,tone, power  Data  Reviewed: I have personally reviewed following labs and imaging studies CBC: Recent Labs  Lab 01/06/2020 0151 01/05/2020 0552 12/25/2019 0419  WBC 19.2* 12.1* 15.8*  NEUTROABS 15.5* 11.4*  --   HGB 16.0 12.7* 13.7  HCT 51.1 42.2 43.2  MCV 93.9 93.4 89.6  PLT 273 207 373   Basic Metabolic Panel: Recent Labs  Lab 12/26/2019 0151 01/10/2020 0552 12/24/2019 1236 01/13/2020 0419  NA 140 140 138 141  K 3.5 5.1 4.2 4.5  CL 106 107 101 103  CO2 _0 GLUCOSE 174* 136* 157* 132*  BUN _1 27*  CREATININE 0.90 0.90 1.14 1.17  CALCIUM 8.9 8.4* 9.2 8.5*  MG  --  2.2 2.2  --    GFR: Estimated Creatinine Clearance: 48.5 mL/min (by C-G formula based on SCr of 1.17 mg/dL). Liver Function Tests: Recent Labs  Lab 12/23/2019 0552  AST  72*  ALT 37  ALKPHOS 99  BILITOT 0.5  PROT 6.6  ALBUMIN 3.6   No results for input(s): LIPASE, AMYLASE in the last 168 hours. No results for input(s): AMMONIA in the last 168 hours. Coagulation Profile: Recent Labs  Lab 12/25/2019 0419  INR 1.1   Cardiac Enzymes: No results for input(s): CKTOTAL, CKMB, CKMBINDEX, TROPONINI in the last 168 hours. BNP (last 3 results) No results for input(s): PROBNP in the last 8760 hours. HbA1C: Recent Labs    01/16/2020 0553  HGBA1C 5.4   CBG: No results for input(s): GLUCAP in the last 168 hours. Lipid Profile: Recent Labs    01/03/2020 1236  CHOL 144  HDL 82  LDLCALC 57  TRIG 26  CHOLHDL 1.8   Thyroid Function Tests: Recent Labs    12/22/2019 0200  TSH 0.560   Anemia Panel: No results for input(s): VITAMINB12, FOLATE, FERRITIN, TIBC, IRON, RETICCTPCT in the last 72 hours. Sepsis Labs: No results for input(s): PROCALCITON, LATICACIDVEN in the last 168 hours.  Recent Results (from the past 240 hour(s))  Respiratory Panel by RT PCR (Flu A&B, Covid) - Nasopharyngeal Swab     Status: None   Collection Time: 01/15/2020  1:51 AM   Specimen: Nasopharyngeal Swab  Result Value Ref Range Status   SARS  Coronavirus 2 by RT PCR NEGATIVE NEGATIVE Final    Comment: (NOTE) SARS-CoV-2 target nucleic acids are NOT DETECTED.  The SARS-CoV-2 RNA is generally detectable in upper respiratoy specimens during the acute phase of infection. The lowest concentration of SARS-CoV-2 viral copies this assay can detect is 131 copies/mL. A negative result does not preclude SARS-Cov-2 infection and should not be used as the sole basis for treatment or other patient management decisions. A negative result may occur with  improper specimen collection/handling, submission of specimen other than nasopharyngeal swab, presence of viral mutation(s) within the areas targeted by this assay, and inadequate number of viral copies (<131 copies/mL). A negative result must be combined with clinical observations, patient history, and epidemiological information. The expected result is Negative.  Fact Sheet for Patients:  PinkCheek.be  Fact Sheet for Healthcare Providers:  GravelBags.it  This test is no t yet approved or cleared by the [place] FDA and  has been authorized for detection and/or diagnosis of SARS-CoV-2 by FDA under an Emergency Use Authorization (EUA). This EUA will remain  in effect (meaning this test can be used) for the duration of the COVID-19 declaration under Section 564(b)(1) of the Act, 21 U.S.C. section 360bbb-3(b)(1), unless the authorization is terminated or revoked sooner.     Influenza A by PCR NEGATIVE NEGATIVE Final   Influenza B by PCR NEGATIVE NEGATIVE Final    Comment: (NOTE) The Xpert Xpress SARS-CoV-2/FLU/RSV assay is intended as an aid in  the diagnosis of influenza from Nasopharyngeal swab specimens and  should not be used as a sole basis for treatment. Nasal washings and  aspirates are unacceptable for Xpert Xpress SARS-CoV-2/FLU/RSV  testing.  Fact Sheet for  Patients: PinkCheek.be  Fact Sheet for Healthcare Providers: GravelBags.it  This test is not yet approved or cleared by the [place] FDA and  has been authorized for detection and/or diagnosis of SARS-CoV-2 by  FDA under an Emergency Use Authorization (EUA). This EUA will remain  in effect (meaning this test can be used) for the duration of the  Covid-19 declaration under Section 564(b)(1) of the Act, 21  U.S.C. section 360bbb-3(b)(1), unless the authorization is  terminated or revoked.  Performed at [facility], [place] [address], [place], [place] [address]   MRSA PCR Screening     Status: None   Collection Time: 01/12/2020 10:11 AM   Specimen: Nasal Mucosa; Nasopharyngeal  Result Value Ref Range Status   MRSA by PCR NEGATIVE NEGATIVE Final    Comment:        The GeneXpert MRSA Assay (FDA approved for NASAL specimens only), is one component of a comprehensive MRSA colonization surveillance program. It is not intended to diagnose MRSA infection nor to guide or monitor treatment for MRSA infections. Performed at [facility], [place] [address] [place], [place] [address]      Radiology Studies: DG Chest Port 1 View  Result Date: 01/19/2020 CLINICAL DATA:  Dyspnea EXAM: PORTABLE CHEST 1 VIEW COMPARISON:  12/05/2019 FINDINGS: The lungs are symmetrically well expanded. Mild cardiomegaly is again noted. There is interval development of mild bilateral mid and lower lung zone airspace and interstitial infiltrate most in keeping with mild pulmonary edema, likely cardiogenic in nature. No pneumothorax or pleural effusion. No acute bone abnormality. IMPRESSION: Interval development of mild cardiogenic failure. Electronically Signed   By: Fidela Salisbury MD   On: 01/10/2020 02:29   ECHOCARDIOGRAM COMPLETE  Result Date: 01/04/2020    ECHOCARDIOGRAM REPORT   Patient Name:   Adrian Phillips  Date of Exam: 01/15/2020 Medical Rec #:  [id number]             Height:       70.0 in Accession #:    [id number]            Weight:       149.0 lb Date of Birth:  01/11/1951             BSA:          1.842 m Patient Age:    85 years              BP:           113/84 mmHg Patient Gender: M                     HR:           91 bpm. Exam Location:  Inpatient Procedure: 2D Echo, Cardiac Doppler, Color Doppler and Intracardiac            Opacification Agent STAT ECHO Indications:    Congestive Heart Failure 428.0 / I50.9  History:        Patient has no prior history of Echocardiogram examinations.                 CHF; Risk Factors:Current Smoker, Hypertension and Dyslipidemia.                 PVD.  Sonographer:    Vickie Epley RDCS Referring Phys: [place]  1. Left ventricular ejection fraction, by estimation, is <20%. The left ventricle has severely decreased function. The left ventricle demonstrates global hypokinesis. The left ventricular internal cavity size was moderately dilated. Left ventricular diastolic parameters are consistent with Grade II diastolic dysfunction (pseudonormalization). Elevated left ventricular end-diastolic pressure.  2. Right ventricular systolic function is normal. The right ventricular size is normal. There is mildly elevated pulmonary artery systolic pressure.  3. Left atrial size was mildly dilated.  4. The mitral valve is normal in structure. Moderate mitral valve regurgitation.  5. The aortic valve is tricuspid. Aortic valve regurgitation is not  visualized. No aortic stenosis is present.  6. The inferior vena cava is dilated in size with <50% respiratory variability, suggesting right atrial pressure of 15 mmHg. Comparison(s): No prior Echocardiogram. Conclusion(s)/Recommendation(s): Severe reduction in LVEF. Communicated to Dr. Lupita Leash and Cecilie Kicks via secure chat. FINDINGS  Left Ventricle: LV thrombus excluded by definity contrast. Left ventricular ejection  fraction, by estimation, is <20%. The left ventricle has severely decreased function. The left ventricle demonstrates global hypokinesis. Definity contrast agent was given IV to delineate the left ventricular endocardial borders. The left ventricular internal cavity size was moderately dilated. There is no left ventricular hypertrophy. Left ventricular diastolic parameters are consistent with Grade II diastolic dysfunction (pseudonormalization). Elevated left ventricular end-diastolic pressure. Right Ventricle: The right ventricular size is normal. No increase in right ventricular wall thickness. Right ventricular systolic function is normal. There is mildly elevated pulmonary artery systolic pressure. The tricuspid regurgitant velocity is 2.37  m/s, and with an assumed right atrial pressure of 15 mmHg, the estimated right ventricular systolic pressure is 74.1 mmHg. Left Atrium: Left atrial size was mildly dilated. Right Atrium: Right atrial size was normal in size. Pericardium: There is no evidence of pericardial effusion. Mitral Valve: The mitral valve is normal in structure. Moderate mitral valve regurgitation. Tricuspid Valve: The tricuspid valve is normal in structure. Tricuspid valve regurgitation is trivial. No evidence of tricuspid stenosis. Aortic Valve: The aortic valve is tricuspid. Aortic valve regurgitation is not visualized. No aortic stenosis is present. Pulmonic Valve: The pulmonic valve was grossly normal. Pulmonic valve regurgitation is mild. No evidence of pulmonic stenosis. Aorta: The aortic root is normal in size and structure. Venous: The inferior vena cava is dilated in size with less than 50% respiratory variability, suggesting right atrial pressure of 15 mmHg. IAS/Shunts: No atrial level shunt detected by color flow Doppler.  LEFT VENTRICLE PLAX 2D LVIDd:         5.70 cm      Diastology LVIDs:         5.10 cm      LV e' medial:    3.26 cm/s LV PW:         0.70 cm      LV E/e' medial:  17.2  LV IVS:        0.70 cm      LV e' lateral:   3.81 cm/s LVOT diam:     2.00 cm      LV E/e' lateral: 14.7 LV SV:         29 LV SV Index:   16 LVOT Area:     3.14 cm  LV Volumes (MOD) LV vol d, MOD A2C: 212.0 ml LV vol d, MOD A4C: 167.0 ml LV vol s, MOD A2C: 184.0 ml LV vol s, MOD A4C: 144.0 ml LV SV MOD A2C:     28.0 ml LV SV MOD A4C:     167.0 ml LV SV MOD BP:      24.0 ml RIGHT VENTRICLE RV S prime:     8.49 cm/s TAPSE (M-mode): 1.6 cm LEFT ATRIUM           Index       RIGHT ATRIUM          Index LA diam:      3.70 cm 2.01 cm/m  RA Area:     9.97 cm LA Vol (A2C): 41.4 ml 22.48 ml/m RA Volume:   14.70 ml 7.98 ml/m LA Vol (A4C): 43.6 ml 23.67 ml/m  AORTIC VALVE LVOT Vmax:   74.70 cm/s LVOT Vmean:  39.600 cm/s LVOT VTI:    0.092 m  AORTA Ao Root diam: 2.60 cm MITRAL VALVE                 TRICUSPID VALVE MV Area (PHT): 3.91 cm      TR Peak grad:   22.5 mmHg MV Decel Time: 194 msec      TR Vmax:        237.00 cm/s MR Peak grad:    75.0 mmHg MR Mean grad:    46.0 mmHg   SHUNTS MR Vmax:         433.00 cm/s Systemic VTI:  0.09 m MR Vmean:        314.0 cm/s  Systemic Diam: 2.00 cm MR PISA:         1.01 cm MR PISA Eff ROA: 9 mm MR PISA Radius:  0.40 cm MV E velocity: 56.10 cm/s MV A velocity: 56.60 cm/s MV E/A ratio:  0.99 Buford Dresser MD Electronically signed by Buford Dresser MD Signature Date/Time: 12/28/2019/9:53:50 AM    Final      LOS: 1 day   Antonieta Pert, MD [facility]  12/31/2019, 8:21 AM

## 2020-01-02 NOTE — Progress Notes (Signed)
[place] for IV heparin Indication: chest pain/ACS  Allergies  Allergen Reactions  . Lisinopril Swelling    Angioedema in 2020    Patient Measurements: Height: 5' 10.5" (179.1 cm) Weight: 57.7 kg (127 lb 3.3 oz) IBW/kg (Calculated) : 74.15 Heparin Dosing Weight: TBW  Vital Signs: Temp: 98.2 F (36.8 C) (10/11 2325) Temp Source: Oral (10/11 2325) BP: 107/62 (10/12 0300) Pulse Rate: 87 (10/12 0300)  Labs: Recent Labs    12/22/2019 0151 01/04/2020 0151 12/28/2019 0552 01/20/2020 0835 12/31/2019 1236 12/30/2019 1817 12/24/2019 0419  HGB 16.0   < > 12.7*  --   --   --  13.7  HCT 51.1  --  42.2  --   --   --  43.2  PLT 273  --  207  --   --   --  220  HEPARINUNFRC  --   --   --   --   --  0.10* 0.29*  CREATININE 0.90   < > 0.90  --  1.14  --  1.17  TROPONINIHS  --   --  1,187* 4,615*  --   --   --    < > = values in this interval not displayed.    Estimated Creatinine Clearance: 48.6 mL/min (by C-G formula based on SCr of 1.17 mg/dL).   Medical History: Past Medical History:  Diagnosis Date  . Diverticulosis   . Hyperlipidemia   . Hypertension   . Internal hemorrhoids   . PVD (peripheral vascular disease) (HCC)     Medications:  Medications Prior to Admission  Medication Sig Dispense Refill Last Dose  . amLODipine (NORVASC) 10 MG tablet Take 10 mg by mouth daily.   12/31/2019 at Unknown time  . aspirin 325 MG tablet Take 325 mg by mouth daily.   12/31/2019 at Unknown time  . atorvastatin (LIPITOR) 80 MG tablet Take 40 mg by mouth at bedtime.    12/30/2019 at Unknown time  . Cholecalciferol 50 MCG (2000 UT) TABS Take 4,000 Units by mouth daily.   12/31/2019 at Unknown time  . clopidogrel (PLAVIX) 75 MG tablet Take 75 mg by mouth at bedtime.    12/30/2019  . tadalafil (CIALIS) 10 MG tablet Take 5 mg by mouth daily as needed for erectile dysfunction.   > 1 month   Scheduled:  . aspirin  81 mg Oral Pre-Cath  . aspirin EC  81 mg Oral  Daily  . atorvastatin  80 mg Oral Daily  . chlorhexidine gluconate (MEDLINE KIT)  15 mL Mouth Rinse BID  . Chlorhexidine Gluconate Cloth  6 each Topical Daily  . clopidogrel  75 mg Oral Daily  . folic acid  1 mg Oral Daily  . furosemide  40 mg Intravenous Q12H  . influenza vaccine adjuvanted  0.5 mL Intramuscular Tomorrow-1000  . mouth rinse  15 mL Mouth Rinse q12n4p  . multivitamin with minerals  1 tablet Oral Daily  . sacubitril-valsartan  1 tablet Oral BID  . sodium chloride flush  3 mL Intravenous Q12H  . thiamine  100 mg Oral Daily   Or  . thiamine  100 mg Intravenous Daily   Infusions:  . sodium chloride    . sodium chloride    . sodium chloride 250 mL (12/26/2019 1049)  . heparin    . nitroGLYCERIN Stopped (01/06/2020 3559)    Assessment: 64 yoM with no prior cardiac history but PMH HTN/HLD, PAD, tobacco & EtOH, admitted for acute SOB.  Found to have NSTEMI with acutely reduced LVEF. Pharmacy to dose IV heparin.   Baseline INR, aPTT: not done  Prior anticoagulation: none  Significant events:  Today, 01/07/2020 5:18 AM:   HL 0.29 sub-therapeutic on heparin gtt @ 1000 units/hr  Infusion not paused per RN and no bleeding reported  CBC: Hgb WNL; Plt WNL  SCr WNL, baseline  Goal of Therapy: Heparin level 0.3-0.7 units/ml Monitor platelets by anticoagulation protocol: Yes  Plan:  Increase heparin rate to 1100 units/hr  Recheck HL in 8 hours  Daily CBC, daily heparin level once stable  Monitor for signs of bleeding or thrombosis  Planning for heart catheterization once respiratory status more stable  Leone Haven, PharmD 12/31/2019, 5:18 AM

## 2020-01-02 NOTE — Interval H&P Note (Signed)
Cath Lab Visit (complete for each Cath Lab visit)  Clinical Evaluation Leading to the Procedure:   ACS: No.  Non-ACS:    Anginal Classification: CCS IV  Anti-ischemic medical therapy: Minimal Therapy (1 class of medications)  Non-Invasive Test Results: No non-invasive testing performed  Prior CABG: No previous CABG      History and Physical Interval Note:  01/20/2020 9:21 AM  Adrian Phillips  has presented today for surgery, with the diagnosis of heart failure - nstemi.  The various methods of treatment have been discussed with the patient and family. After consideration of risks, benefits and other options for treatment, the patient has consented to  Procedure(s): RIGHT/LEFT HEART CATH AND CORONARY ANGIOGRAPHY (N/A) as a surgical intervention.  The patient's history has been reviewed, patient examined, no change in status, stable for surgery.  I have reviewed the patient's chart and labs.  Questions were answered to the patient's satisfaction.     Lyn Records III

## 2020-01-02 NOTE — Consult Note (Addendum)
Advanced Heart Failure Team Consult Note   Primary Physician: Bea Laura, MD PCP-Cardiologist:  Jenkins Rouge, MD  Reason for Consultation: Heart Failure   HPI:    Adrian Phillips is seen today for evaluation of Heart Failure  at the request of Dr [name].   [name] is a 69 year old with a history of  HTN, HLD, PAD, tobacco abuse, and ETOH use.   Of note on 12/05/19 he presented to [facility] with shortness of breath. Says he left AMA due to long wait time in [place]. Pain had resolved.    Prior to admit he was independent.  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 He worked a full day on 12/31/19. No leg pain when walking. Lives alone. Completely independent. Smoking 8-9 cigarettes. ETOH 2-3 beers a day.   On Sunday night he was watching football and felt ok. Sometime after midnight he had acute shortness of breath. Called 911.  Presented to [facility] 01/20/2020 via EMS with increased shortness of breath and lower extremity edema. On arrival EKG showed ST with rate in the 130s, hypoxic, and hypertensive. Placed on IV NTG but later stopped due to hypotension. Placed on IV lasix. Echo completed and showed EF < 20% and RV was normal and mod MR.    Pertinent admission labs : SARS2 negative, creatinine 0.9, HiV NR, WBC 19, BNP 269, HS Trop [id number]. Transferred to [facility] today for cath. See below.   Denies SOB. Denies chest pain. Complaining of numbness in R fingers--3rd, 4th, and thumb.   RHC/LHC 12/31/2019  RHC  RA 2 PA 41/14 (27) PCWP 20 CO 3.4 CI 1.93  Severe calcified eccentric proximal LAD, moderate proximal circumflex (eccentric 70 %), 75% ostial first obtuse marginal, 99% OM 2, and chronically occluded RCA with left-to-right collaterals.  Distal RCA is tiny and may not be graftable.   Review of Systems: [y] = yes, [ ]  = no   . General: Weight gain [ ] ; Weight loss [ ] ; Anorexia [ ] ; Fatigue [ Y]; Fever [ ] ; Chills [ ] ; Weakness [Y ]  . Cardiac: Chest  pain/pressure [ ] ; Resting SOB [ ] ; Exertional SOB [Y ]; Orthopnea [ ] ; Pedal Edema [ ] ; Palpitations [ ] ; Syncope [ ] ; Presyncope [ ] ; Paroxysmal nocturnal dyspnea[ ]   . Pulmonary: Cough [ ] ; Wheezing[ ] ; Hemoptysis[ ] ; Sputum [ ] ; Snoring [ ]   . GI: Vomiting[ ] ; Dysphagia[ ] ; Melena[ ] ; Hematochezia [ ] ; Heartburn[ ] ; Abdominal pain [ ] ; Constipation [ ] ; Diarrhea [ ] ; BRBPR [ ]   . GU: Hematuria[ ] ; Dysuria [ ] ; Nocturia[ ]   . Vascular: Pain in legs with walking [ ] ; Pain in feet with lying flat [ ] ; Non-healing sores [ ] ; Stroke [ ] ; TIA [ ] ; Slurred speech [ ] ;  . Neuro: Headaches[ ] ; Vertigo[ ] ; Seizures[ ] ; Paresthesias[ ] ;Blurred vision [ ] ; Diplopia [ ] ; Vision changes [ ]   . Ortho/Skin: Arthritis [ ] ; Joint pain [Y ]; Muscle pain [ ] ; Joint swelling [ ] ; Back Pain [Y ]; Rash [ ]   . Psych: Depression[ ] ; Anxiety[ ]   . Heme: Bleeding problems [ ] ; Clotting disorders [ ] ; Anemia [ ]   . Endocrine: Diabetes [ ] ; Thyroid dysfunction[ ]   Home Medications Prior to Admission medications   Medication Sig Start Date End Date Taking? Authorizing Provider  amLODipine (NORVASC) 10 MG tablet Take 10 mg by mouth daily.   Yes [provider]  aspirin 325 MG tablet Take 325 mg by mouth daily.   Yes [provider]  atorvastatin (LIPITOR) 80 MG tablet Take 40 mg by mouth at bedtime.    Yes [provider]  Cholecalciferol 50 MCG (2000 UT) TABS Take 4,000 Units by mouth daily.   Yes [provider]  clopidogrel (PLAVIX) 75 MG tablet Take 75 mg by mouth at bedtime.    Yes [provider]  tadalafil (CIALIS) 10 MG tablet Take 5 mg by mouth daily as needed for erectile dysfunction.   Yes [provider]    Past Medical History: Past Medical History:  Diagnosis Date  . Diverticulosis   . Hyperlipidemia   . Hypertension   . Internal hemorrhoids   . PVD (peripheral vascular disease) ([place])     Past Surgical History: Past Surgical History:    Procedure Laterality Date  . COLONOSCOPY  12/13/2008   Dr Janet Berlin at [place]  . INGUINAL HERNIA REPAIR Right     Family History: Family History  Problem Relation Age of Onset  . Colon polyps Neg Hx   . Colon cancer Neg Hx     Social History: Social History   Socioeconomic History  . Marital status: Widowed    Spouse name: Not on file  . Number of children: Not on file  . Years of education: Not on file  . Highest education level: Not on file  Occupational History  . Not on file  Tobacco Use  . Smoking status: Current Every Day Smoker    Types: Cigarettes  . Smokeless tobacco: Never Used  Substance and Sexual Activity  . Alcohol use: Not Currently  . Drug use: Never  . Sexual activity: Not on file  Other Topics Concern  . Not on file  Social History Narrative  . Not on file   Social Determinants of Health   Financial Resource Strain:   . Difficulty of Paying Living Expenses: Not on file  Food Insecurity:   . Worried About Charity fundraiser in the Last Year: Not on file  . Ran Out of Food in the Last Year: Not on file  Transportation Needs:   . Lack of Transportation (Medical): Not on file  . Lack of Transportation (Non-Medical): Not on file  Physical Activity:   . Days of Exercise per Week: Not on file  . Minutes of Exercise per Session: Not on file  Stress:   . Feeling of Stress : Not on file  Social Connections:   . Frequency of Communication with Friends and Family: Not on file  . Frequency of Social Gatherings with Friends and Family: Not on file  . Attends Religious Services: Not on file  . Active Member of Clubs or Organizations: Not on file  . Attends Archivist Meetings: Not on file  . Marital Status: Not on file    Allergies:  Allergies  Allergen Reactions  . Lisinopril Swelling    Angioedema in 2020    Objective:    Vital Signs:   Temp:  [97.6 F (36.4 C)-98.3 F (36.8 C)] 98.3 F (36.8 C) (10/12  0800) Pulse Rate:  [78-129] 79 (10/12 0815) Resp:  [18-33] 20 (10/12 0815) BP: (82-136)/(40-81) 103/81 (10/12 0815) SpO2:  [96 %-100 %] 99 % (10/12 0930) Weight:  [57.6 kg-57.7 kg] 57.6 kg (10/12 0500) Last BM Date: 12/31/19  Weight change: Filed Weights   01/07/2020 0214 01/12/2020 1000 12/30/2019 0500  Weight: 67.6 kg 57.7 kg 57.6  kg    Intake/Output:   Intake/Output Summary (Last 24 hours) at 01/03/2020 0941 Last data filed at 01/20/2020 0800 Gross per 24 hour  Intake 1169.02 ml  Output 3100 ml  Net -1930.98 ml      Physical Exam    General:   No resp difficulty HEENT: normal Neck: supple. JVP does not appear elevated. Carotids 2+ bilat; no bruits. No lymphadenopathy or thyromegaly appreciated. Cor: PMI nondisplaced. Regular rate & rhythm. No rubs, gallops or murmurs. Lungs: clear Abdomen: soft, nontender, nondistended. No hepatosplenomegaly. No bruits or masses. Good bowel sounds. Extremities: no cyanosis, clubbing, rash, edema. R hand 3rd 4th and thumb numb. R TR band in place. Palpable radial pulse on R/L Neuro: alert & orientedx3, cranial nerves grossly intact. moves all 4 extremities w/o difficulty. Affect pleasant   Telemetry   SR 80s with occasional PVCs   EKG   SR 99 bpm    Labs   Basic Metabolic Panel: Recent Labs  Lab 01/05/2020 0151 12/27/2019 0151 01/10/2020 0552 01/19/2020 1236 01/15/2020 0419  NA 140  --  140 138 141  K 3.5  --  5.1 4.2 4.5  CL 106  --  107 101 103  CO2 24  --  23 26 30   GLUCOSE 174*  --  136* 157* 132*  BUN 12  --  15 18 27*  CREATININE 0.90  --  0.90 1.14 1.17  CALCIUM 8.9   < > 8.4* 9.2 8.5*  MG  --   --  2.2 2.2  --    < > = values in this interval not displayed.    Liver Function Tests: Recent Labs  Lab 01/07/2020 0552  AST 72*  ALT 37  ALKPHOS 99  BILITOT 0.5  PROT 6.6  ALBUMIN 3.6   No results for input(s): LIPASE, AMYLASE in the last 168 hours. No results for input(s): AMMONIA in the last 168 hours.  CBC: Recent  Labs  Lab 12/31/2019 0151 01/18/2020 0552 12/25/2019 0419  WBC 19.2* 12.1* 15.8*  NEUTROABS 15.5* 11.4*  --   HGB 16.0 12.7* 13.7  HCT 51.1 42.2 43.2  MCV 93.9 93.4 89.6  PLT 273 207 220    Cardiac Enzymes: No results for input(s): CKTOTAL, CKMB, CKMBINDEX, TROPONINI in the last 168 hours.  BNP: BNP (last 3 results) Recent Labs    01/19/2020 0151  BNP 269.4*    ProBNP (last 3 results) No results for input(s): PROBNP in the last 8760 hours.   CBG: No results for input(s): GLUCAP in the last 168 hours.  Coagulation Studies: Recent Labs    01/20/2020 0419  LABPROT 14.1  INR 1.1     Imaging    No results found.   Medications:     Current Medications: . [MAR Hold] aspirin EC  81 mg Oral Daily  . [MAR Hold] atorvastatin  80 mg Oral Daily  . [MAR Hold] chlorhexidine gluconate (MEDLINE KIT)  15 mL Mouth Rinse BID  . [MAR Hold] Chlorhexidine Gluconate Cloth  6 each Topical Daily  . [MAR Hold] clopidogrel  75 mg Oral Daily  . [MAR Hold] folic acid  1 mg Oral Daily  . [MAR Hold] furosemide  40 mg Intravenous Q12H  . influenza vaccine adjuvanted  0.5 mL Intramuscular Tomorrow-1000  . [MAR Hold] mouth rinse  15 mL Mouth Rinse q12n4p  . [MAR Hold] multivitamin with minerals  1 tablet Oral Daily  . [MAR Hold] sodium chloride flush  3 mL Intravenous Q12H  . [facility]  Hold] thiamine  100 mg Oral Daily   Or  . [MAR Hold] thiamine  100 mg Intravenous Daily     Infusions: . sodium chloride    . sodium chloride 10 mL/hr at 01/03/2020 0800  . [MAR Hold] sodium chloride 250 mL (01/16/2020 1049)  . heparin 1,100 Units/hr (01/06/2020 0800)  . nitroGLYCERIN Stopped (12/31/2019 9604)       Patient Profile    69 y.o. male with a hx of HTN, HLD, PAD, tobacco and ETOH use, admitted for acute CHF and respiratory failure on BiPAP and NSTEMI.   Assessment/Plan   1. Acute Systolic HF ECHO this admit EF < 20% Severe MR -Cath today multivessel CAD, index 1.9, cardiac output 3.4.  - Add  digoxin 0.125 mg daily.  -Continue IV lasix 40 mg twice a day.  -Hypotensive. No room for HF meds with SBP in the 90s.  May add spiro tomorrow after BMET if renal function remains stable.  -H/O angioedema from lisinopril but has received 2 doses of entesto. Would keep off for now with hypotension.   -Renal function stable.  - CMRI to assess for viability.   2. NSTEMI  -HS Trop [id number].  -Cath today with multivessel disease.   Severe calcified eccentric proximal LAD, moderate proximal circumflex (eccentric 70 %), 75% ostial first obtuse marginal, 99% OM 2, and chronically occluded RCA with left-to-right collaterals.  Distal RCA is tiny and may not be graftable.  Obtain CMRI for viability  CT surgery to evaluated.  Possible high risk PCI -On statin. Restarting heparin drip later today post cath.   -Plavix on hold for possible CT surgeon.  3. NSVT Has had PVCs and NSVT this admit.  -May need to add amio.  ? Scar medicated. CMRI tomorrow.   4. HLD On statin.   5. PAD H/O R Fem Pop in 2005 Has been on plavix. Hold for now due cath.   6. ID Elevated WBC 15. Afebrile.  Check CBC daily.   Length of Stay: 1  Amy Clegg, NP  01/04/2020, 9:41 AM  Advanced Heart Failure Team Pager [phone] (M-F; 7a - 4p)  Please contact [facility] for night-coverage after hours (4p -7a ) and weekends on [email or web address]  Patient seen with NP, agree with the above note.   He has history of PAD s/p right fem-pop bypass, HTN, and active smoking, no prior history of CHF or CAD.  He was admitted with sudden dyspnea, found to have NSTEMI.  Echo showed EF < 20% with moderate LV dilation, normal RV, moderate MR.  Coronary angiography showed severe 3VD and RHC showed low cardiac index at 1.93, normal RA pressure, and mildly elevated PCWP.  During this stay, he has been noted to have NSVT.   General: NAD Neck: No JVD, no thyromegaly or thyroid nodule.  Lungs: Clear to auscultation bilaterally with normal  respiratory effort. CV: Nondisplaced PMI.  Heart regular S1/S2, no S3/S4, no murmur.  No peripheral edema.  No carotid bruit.  Difficult to palpate pedal pulses.  Right radial pulse 2+.  Abdomen: Soft, nontender, no hepatosplenomegaly, no distention.  Skin: Intact without lesions or rashes.  Neurologic: Alert and oriented x 3.  Psych: Normal affect. Extremities: No clubbing or cyanosis.  HEENT: Normal.   1. CAD: NSTEMI with cath showing totally occluded RCA with collaterals from LAD, 95% proximal LAD, 80% OM1 and 80% OM2.  Suspect he would be best-served by CABG.   - Continue ASA 81 daily.  - Continue atorvastatin 80  mg daily.  - Continue heparin gtt.  - Will arrange for cardiac MRI to assess for viability though would have a low threshold to operate given age and functionality (working full time at [employer]).  2. Acute systolic CHF: Ischemic cardiomyopathy.  Echo this admission with EF < 20%, normal RV.  CI mildly decreased at 1.93 with mildly elevated PCWP and normal RA pressure.  Patient is comfortable at rest.  He received 1 dose of Entresto and BP dropped significantly, now recovered.  - Stable for now, think we can hold off on PICC for the time being.  - Lasix 40 mg IV bid today, probably to po tomorrow.  - Continue digoxin 0.125 daily.  - Add spironolactone 12.5 mg daily.  - Would not retry Entresto given history of angioedema with ACEI.  He could probably safely start ARB in future.  3. NSVT: Noted runs this admission.   - With likely CABG in future, would start amiodarone 200 mg bid.  4. Active smoker: Needs to quit, counseled.  5. PAD: h/o right fem-pop bypass.   Loralie Champagne 12/30/2019 5:49 PM

## 2020-01-03 ENCOUNTER — Inpatient Hospital Stay (HOSPITAL_COMMUNITY): Payer: No Typology Code available for payment source

## 2020-01-03 ENCOUNTER — Inpatient Hospital Stay: Payer: Self-pay

## 2020-01-03 ENCOUNTER — Ambulatory Visit: Payer: BC Managed Care – PPO | Admitting: Nurse Practitioner

## 2020-01-03 DIAGNOSIS — I25110 Atherosclerotic heart disease of native coronary artery with unstable angina pectoris: Secondary | ICD-10-CM | POA: Diagnosis not present

## 2020-01-03 DIAGNOSIS — I25750 Atherosclerosis of native coronary artery of transplanted heart with unstable angina: Secondary | ICD-10-CM

## 2020-01-03 DIAGNOSIS — Z720 Tobacco use: Secondary | ICD-10-CM

## 2020-01-03 DIAGNOSIS — J9601 Acute respiratory failure with hypoxia: Secondary | ICD-10-CM | POA: Diagnosis not present

## 2020-01-03 DIAGNOSIS — I5021 Acute systolic (congestive) heart failure: Secondary | ICD-10-CM

## 2020-01-03 LAB — BLOOD GAS, ARTERIAL
Acid-Base Excess: 4.1 mmol/L — ABNORMAL HIGH (ref 0.0–2.0)
Bicarbonate: 27.9 mmol/L (ref 20.0–28.0)
Drawn by: 270271
FIO2: 21
O2 Saturation: 93.1 %
Patient temperature: 37
pCO2 arterial: 40.1 mmHg (ref 32.0–48.0)
pH, Arterial: 7.456 — ABNORMAL HIGH (ref 7.350–7.450)
pO2, Arterial: 71.2 mmHg — ABNORMAL LOW (ref 83.0–108.0)

## 2020-01-03 LAB — COMPREHENSIVE METABOLIC PANEL
ALT: 35 U/L (ref 0–44)
AST: 53 U/L — ABNORMAL HIGH (ref 15–41)
Albumin: 3 g/dL — ABNORMAL LOW (ref 3.5–5.0)
Alkaline Phosphatase: 83 U/L (ref 38–126)
Anion gap: 11 (ref 5–15)
BUN: 19 mg/dL (ref 8–23)
CO2: 25 mmol/L (ref 22–32)
Calcium: 8.6 mg/dL — ABNORMAL LOW (ref 8.9–10.3)
Chloride: 103 mmol/L (ref 98–111)
Creatinine, Ser: 1.09 mg/dL (ref 0.61–1.24)
GFR, Estimated: >60 mL/min (ref >60)
Glucose, Bld: 104 mg/dL — ABNORMAL HIGH (ref 70–99)
Potassium: 3.9 mmol/L (ref 3.5–5.1)
Sodium: 139 mmol/L (ref 135–145)
Total Bilirubin: 0.4 mg/dL (ref 0.3–1.2)
Total Protein: 5.6 g/dL — ABNORMAL LOW (ref 6.5–8.1)

## 2020-01-03 LAB — TSH: TSH: 2.08 u[IU]/mL (ref 0.350–4.500)

## 2020-01-03 LAB — CBC
HCT: 42.6 % (ref 39.0–52.0)
Hemoglobin: 13.5 g/dL (ref 13.0–17.0)
MCH: 28.3 pg (ref 26.0–34.0)
MCHC: 31.7 g/dL (ref 30.0–36.0)
MCV: 89.3 fL (ref 80.0–100.0)
Platelets: 220 10*3/uL (ref 150–400)
RBC: 4.77 MIL/uL (ref 4.22–5.81)
RDW: 14.5 % (ref 11.5–15.5)
WBC: 11.6 10*3/uL — ABNORMAL HIGH (ref 4.0–10.5)
nRBC: 0 % (ref 0.0–0.2)

## 2020-01-03 LAB — PROTIME-INR
INR: 1.1 (ref 0.8–1.2)
Prothrombin Time: 13.4 seconds (ref 11.4–15.2)

## 2020-01-03 LAB — HEPARIN LEVEL (UNFRACTIONATED)
Heparin Unfractionated: 0.28 IU/mL — ABNORMAL LOW (ref 0.30–0.70)
Heparin Unfractionated: 0.47 IU/mL (ref 0.30–0.70)

## 2020-01-03 LAB — MAGNESIUM: Magnesium: 2 mg/dL (ref 1.7–2.4)

## 2020-01-03 MED ORDER — GADOBUTROL 1 MMOL/ML IV SOLN
10.0000 mL | Freq: Once | INTRAVENOUS | Status: AC | PRN
  Administered 2020-01-03: 10 mL via INTRAVENOUS

## 2020-01-03 MED ORDER — SPIRONOLACTONE 12.5 MG HALF TABLET
12.5000 mg | ORAL_TABLET | Freq: Once | ORAL | Status: AC
  Administered 2020-01-03: 12.5 mg via ORAL
  Filled 2020-01-03: qty 1

## 2020-01-03 MED ORDER — FUROSEMIDE 40 MG PO TABS
40.0000 mg | ORAL_TABLET | Freq: Every day | ORAL | Status: DC
  Administered 2020-01-03 – 2020-01-04 (×2): 40 mg via ORAL
  Filled 2020-01-03 (×2): qty 1

## 2020-01-03 MED ORDER — LOSARTAN POTASSIUM 25 MG PO TABS
12.5000 mg | ORAL_TABLET | Freq: Every day | ORAL | Status: AC
  Administered 2020-01-03 – 2020-01-07 (×4): 12.5 mg via ORAL
  Filled 2020-01-03 (×5): qty 1

## 2020-01-03 MED ORDER — MILRINONE LACTATE IN DEXTROSE 20-5 MG/100ML-% IV SOLN
0.2500 ug/kg/min | INTRAVENOUS | Status: DC
  Administered 2020-01-03 – 2020-01-07 (×6): 0.25 ug/kg/min via INTRAVENOUS
  Filled 2020-01-03 (×7): qty 100

## 2020-01-03 MED ORDER — SPIRONOLACTONE 25 MG PO TABS
25.0000 mg | ORAL_TABLET | Freq: Every day | ORAL | Status: DC
  Administered 2020-01-04 – 2020-01-07 (×4): 25 mg via ORAL
  Filled 2020-01-03 (×4): qty 1

## 2020-01-03 MED ORDER — SODIUM CHLORIDE 0.9% FLUSH
10.0000 mL | Freq: Two times a day (BID) | INTRAVENOUS | Status: DC
  Administered 2020-01-03 – 2020-01-07 (×8): 10 mL

## 2020-01-03 MED ORDER — SODIUM CHLORIDE 0.9% FLUSH: 10.0000 mL | INTRAVENOUS | Status: DC | PRN

## 2020-01-03 NOTE — Discharge Instructions (Signed)
Milrinone injection What is this medicine? MILRINONE (MILL rih none) is an inotrope and vasodilator. It increases the strength of the heart muscle and widens blood vessels. It is used to treat heart failure. This medicine may be used for other purposes; ask your health care provider or pharmacist if you have questions. COMMON BRAND NAME(S): Primacor What should I tell my health care provider before I take this medicine? They need to know if you have any of these conditions:  heart disease  history of irregular heartbeat  kidney disease  unusual or allergic reaction to milrinone, other medicines, foods, dyes, or preservatives  pregnant or trying to get pregnant  breast-feeding How should I use this medicine? This medicine is for infusion into a vein. It is given by a health care professional in a hospital or clinic setting. Talk to your pediatrician regarding the use of this medicine in children. While this drug may be prescribed for selected conditions, precautions do apply. Overdosage: If you think you have taken too much of this medicine contact a poison control center or emergency room at once. NOTE: This medicine is only for you. Do not share this medicine with others. What if I miss a dose? This does not apply. This medicine is not for regular use. What may interact with this medicine? This medicine may interact with the following medications:  diuretics  medicines for blood pressure, heart disease, irregular heartbeat This list may not describe all possible interactions. Give your health care provider a list of all the medicines, herbs, non-prescription drugs, or dietary supplements you use. Also tell them if you smoke, drink alcohol, or use illegal drugs. Some items may interact with your medicine. What should I watch for while using this medicine? Your condition will be monitored carefully while you are receiving this medicine. What side effects may I notice from receiving  this medicine? Side effects that you should report to your doctor or health care professional as soon as possible:  allergic reactions like skin rash, itching or hives, swelling of the face, lips, or tongue  chest pain  pain, redness, or irritation at site where injected  signs and symptoms of a dangerous change in heartbeat or heart rhythm like chest pain; dizziness; fast or irregular heartbeat; palpitations; feeling faint or lightheaded, falls; breathing problems  signs and symptoms of low blood pressure like dizziness; feeling faint or lightheaded, falls; unusually weak or tired  unusual bruising or bleeding Side effects that usually do not require medical attention (report to your doctor or health care professional if they continue or are bothersome):  headache  nausea, vomiting This list may not describe all possible side effects. Call your doctor for medical advice about side effects. You may report side effects to FDA at 1-800-FDA-1088. Where should I keep my medicine? This drug is given in a hospital or clinic and will not be stored at home. NOTE: This sheet is a summary. It may not cover all possible information. If you have questions about this medicine, talk to your doctor, pharmacist, or health care provider.  2020 Elsevier/Gold Standard (2019-01-17 10:44:39)

## 2020-01-03 NOTE — Progress Notes (Signed)
[facility] for IV heparin Indication: chest pain/ACS  Allergies  Allergen Reactions  . Lisinopril Swelling    Angioedema in 2020    Patient Measurements: Height: 5' 10.5" (179.1 cm) Weight: 57.6 kg (126 lb 15.8 oz) IBW/kg (Calculated) : 74.15 Heparin Dosing Weight: TBW  Vital Signs: Temp: 98 F (36.7 C) (10/13 0018) Temp Source: Oral (10/13 0018) BP: 107/71 (10/12 2000) Pulse Rate: 93 (10/13 0018)  Labs: Recent Labs    12/31/2019 0552 12/22/2019 0552 12/31/2019 0835 12/29/2019 1236 12/23/2019 1817 01/12/2020 0419 01/17/2020 0955 12/31/2019 1001 12/30/2019 1001 12/22/2019 1011 01/03/20 0031  HGB 12.7*   < >  --   --   --  13.7   < > 15.6   < > 14.6 13.5  HCT 42.2   < >  --   --   --  43.2   < > 46.0  --  43.0 42.6  PLT 207  --   --   --   --  220  --   --   --   --  220  LABPROT  --   --   --   --   --  14.1  --   --   --   --  13.4  INR  --   --   --   --   --  1.1  --   --   --   --  1.1  HEPARINUNFRC  --   --   --   --  0.10* 0.29*  --   --   --   --  0.28*  CREATININE 0.90   < >  --  1.14  --  1.17  --   --   --   --  1.09  TROPONINIHS 1,187*  --  4,615*  --   --   --   --   --   --   --   --    < > = values in this interval not displayed.    Estimated Creatinine Clearance: 52.1 mL/min (by C-G formula based on SCr of 1.09 mg/dL).   Medical History: Past Medical History:  Diagnosis Date  . Diverticulosis   . Hyperlipidemia   . Hypertension   . Internal hemorrhoids   . PVD (peripheral vascular disease) ([facility])     Medications:  Medications Prior to Admission  Medication Sig Dispense Refill Last Dose  . amLODipine (NORVASC) 10 MG tablet Take 10 mg by mouth daily.   12/31/2019 at Unknown time  . aspirin 325 MG tablet Take 325 mg by mouth daily.   12/31/2019 at Unknown time  . atorvastatin (LIPITOR) 80 MG tablet Take 40 mg by mouth at bedtime.    12/30/2019 at Unknown time  . Cholecalciferol 50 MCG (2000 UT) TABS Take 4,000 Units by mouth  daily.   12/31/2019 at Unknown time  . clopidogrel (PLAVIX) 75 MG tablet Take 75 mg by mouth at bedtime.    12/30/2019  . tadalafil (CIALIS) 10 MG tablet Take 5 mg by mouth daily as needed for erectile dysfunction.   > 1 month   Scheduled:  . amiodarone  200 mg Oral BID  . aspirin EC  81 mg Oral Daily  . atorvastatin  80 mg Oral Daily  . chlorhexidine gluconate (MEDLINE KIT)  15 mL Mouth Rinse BID  . Chlorhexidine Gluconate Cloth  6 each Topical Daily  . digoxin  0.125 mg Oral Daily  . folic acid  1 mg Oral Daily  . furosemide  40 mg Intravenous Q12H  . influenza vaccine adjuvanted  0.5 mL Intramuscular Tomorrow-1000  . mouth rinse  15 mL Mouth Rinse q12n4p  . multivitamin with minerals  1 tablet Oral Daily  . sodium chloride flush  3 mL Intravenous Q12H  . sodium chloride flush  3 mL Intravenous Q12H  . spironolactone  12.5 mg Oral Daily  . thiamine  100 mg Oral Daily   Or  . thiamine  100 mg Intravenous Daily   Infusions:  . sodium chloride 250 mL (12/28/2019 1049)  . sodium chloride    . heparin 1,100 Units/hr (12/31/2019 2000)  . nitroGLYCERIN Stopped (01/16/2020 4720)    Assessment: 21 yoM with no prior cardiac history but PMH HTN/HLD, PAD, tobacco & EtOH, admitted for acute SOB. Found to have NSTEMI with acutely reduced LVEF 20%. Pharmacy to dose IV heparin. Heparin drip 1000 uts/hr HL 0.3 this am > rate increased to 1100 uts/hr and then patient taken to cath lab 3vCAD deciding PCI vs CABG Resume heparin 8hr after sheath removed (out at 1000 resume at 1800)  Follow up labs in am   10/13 AM update:  Heparin level just below goal  No issues per RN  Goal of Therapy: Heparin level 0.3-0.7 units/ml Monitor platelets by anticoagulation protocol: Yes  Plan: Inc heparin to 1200 units/hr 1000 heparin level   Narda Bonds, PharmD, BCPS Clinical Pharmacist Phone: [phone]

## 2020-01-03 NOTE — Progress Notes (Addendum)
Advanced Heart Failure Rounding Note  PCP-Cardiologist: Jenkins Rouge, MD   Subjective:    Feels ok. Denies SOB. Denies chest pain.     Objective:   Weight Range: 57.6 kg Body mass index is 17.96 kg/m.   Vital Signs:   Temp:  [98 F (36.7 C)-98.3 F (36.8 C)] 98.1 F (36.7 C) (10/13 0448) Pulse Rate:  [66-112] 66 (10/13 0448) Resp:  [0-46] 20 (10/13 0600) BP: (82-128)/(40-84) 123/59 (10/13 0600) SpO2:  [0 %-100 %] 95 % (10/13 0018) Last BM Date: 12/31/19  Weight change: Filed Weights   01/17/2020 0214 12/27/2019 1000 01/07/2020 0500  Weight: 67.6 kg 57.7 kg 57.6 kg    Intake/Output:   Intake/Output Summary (Last 24 hours) at 01/03/2020 0721 Last data filed at 01/03/2020 0400 Gross per 24 hour  Intake 240.96 ml  Output 2400 ml  Net -2159.04 ml      Physical Exam    General:  Well appearing. No resp difficulty HEENT: Normal Neck: Supple. JVP flat . Carotids 2+ bilat; no bruits. No lymphadenopathy or thyromegaly appreciated. Cor: PMI nondisplaced. Regular rate & rhythm. No rubs, gallops or murmurs. Lungs: Clear Abdomen: Soft, nontender, nondistended. No hepatosplenomegaly. No bruits or masses. Good bowel sounds. Extremities: No cyanosis, clubbing, rash, edema Neuro: Alert & orientedx3, cranial nerves grossly intact. moves all 4 extremities w/o difficulty. Affect pleasant   Telemetry  SR 70-80s   EKG    n/a  Labs    CBC Recent Labs    12/23/2019 0151 01/03/2020 0151 12/22/2019 0552 01/04/2020 0552 12/30/2019 0419 12/28/2019 0955 01/07/2020 1011 01/03/20 0031  WBC 19.2*   < > 12.1*   < > 15.8*  --   --  11.6*  NEUTROABS 15.5*  --  11.4*  --   --   --   --   --   HGB 16.0   < > 12.7*   < > 13.7   < > 14.6 13.5  HCT 51.1   < > 42.2   < > 43.2   < > 43.0 42.6  MCV 93.9   < > 93.4   < > 89.6  --   --  89.3  PLT 273   < > 207   < > 220  --   --  220   < > = values in this interval not displayed.   Basic Metabolic Panel Recent Labs    01/15/2020 1236  01/19/2020 1236 12/27/2019 0419 01/04/2020 0955 01/19/2020 1011 01/03/20 0031  NA 138   < > 141   < > 140 139  K 4.2   < > 4.5   < > 3.7 3.9  CL 101   < > 103  --   --  103  CO2 26   < > 30  --   --  25  GLUCOSE 157*   < > 132*  --   --  104*  BUN 18   < > 27*  --   --  19  CREATININE 1.14   < > 1.17  --   --  1.09  CALCIUM 9.2   < > 8.5*  --   --  8.6*  MG 2.2  --   --   --   --  2.0   < > = values in this interval not displayed.   Liver Function Tests Recent Labs    01/10/2020 0552 01/03/20 0031  AST 72* 53*  ALT 37 35  ALKPHOS 99 83  BILITOT 0.5 0.4  PROT 6.6 5.6*  ALBUMIN 3.6 3.0*   No results for input(s): LIPASE, AMYLASE in the last 72 hours. Cardiac Enzymes No results for input(s): CKTOTAL, CKMB, CKMBINDEX, TROPONINI in the last 72 hours.  BNP: BNP (last 3 results) Recent Labs    01/13/2020 0151  BNP 269.4*    ProBNP (last 3 results) No results for input(s): PROBNP in the last 8760 hours.   D-Dimer Recent Labs    12/31/2019 0552  DDIMER 0.94*   Hemoglobin A1C Recent Labs    01/10/2020 0553  HGBA1C 5.4   Fasting Lipid Panel Recent Labs    01/10/2020 1236  CHOL 144  HDL 82  LDLCALC 57  TRIG 26  CHOLHDL 1.8   Thyroid Function Tests Recent Labs    01/03/20 0031  TSH 2.080    Other results:   Imaging    CARDIAC CATHETERIZATION  Result Date: 01/18/2020  Severe three-vessel coronary disease with total occlusion mid RCA with left-to-right collaterals (distal vessel is small and probably not graftable), high-grade calcified proximal LAD, severe circumflex disease with eccentric proximal 70% stenosis, 80% first obtuse marginal, and 90% third obtuse marginal.  The left main is widely patent.  Known severe LV systolic dysfunction with EF 25%.  Findings are consistent with acute on chronic combined systolic and diastolic heart failure.  LVEDP is 40 mmHg.  Mild pulmonary hypertension, WHO group 2.  Mean wedge 20 mmHg, PVR 2.1 Woods units. RECOMMENDATIONS:   Heart team debate concerning revascularization.  At his age with multivessel coronary disease, surgery would be a more durable approach.  High risk PCI on the calcified proximal LAD is also possible but would need to be done with support.  MRI viability study will help clarify potential approach.  Aggressive therapy of acute component of heart failure.  Until conversation with all involved relative to revascularization strategy, will hold Plavix.  Aspirin was started.     Medications:     Scheduled Medications: . amiodarone  200 mg Oral BID  . aspirin EC  81 mg Oral Daily  . atorvastatin  80 mg Oral Daily  . chlorhexidine gluconate (MEDLINE KIT)  15 mL Mouth Rinse BID  . Chlorhexidine Gluconate Cloth  6 each Topical Daily  . digoxin  0.125 mg Oral Daily  . folic acid  1 mg Oral Daily  . furosemide  40 mg Intravenous Q12H  . influenza vaccine adjuvanted  0.5 mL Intramuscular Tomorrow-1000  . mouth rinse  15 mL Mouth Rinse q12n4p  . multivitamin with minerals  1 tablet Oral Daily  . sodium chloride flush  3 mL Intravenous Q12H  . sodium chloride flush  3 mL Intravenous Q12H  . spironolactone  12.5 mg Oral Daily  . thiamine  100 mg Oral Daily   Or  . thiamine  100 mg Intravenous Daily     Infusions: . sodium chloride 250 mL (01/07/2020 1049)  . sodium chloride    . heparin 1,200 Units/hr (01/03/20 0400)  . nitroGLYCERIN Stopped (01/05/2020 0350)     PRN Medications:  sodium chloride, sodium chloride, acetaminophen, LORazepam **OR** LORazepam, ondansetron (ZOFRAN) IV, oxyCODONE, sodium chloride flush     Assessment/Plan  1. Acute Systolic HF ECHO this admit EF < 20% Severe MR -Cath today multivessel CAD, index 1.9, cardiac output 3.4.  - Continue digoxin 0.125 mg daily.  - Volume status improving. Continue IV lasix 40 mg twice a day.  - Increase spironolactone 25 mg   - H/O angioedema from  lisinopril but has received 2 doses of entesto. Would keep off for now with  hypotension.   -Renal function stable.  - CMRI to assess for viability.   2. NSTEMI  -HS Trop [id number].  -Cath today with multivessel disease.   Severe calcified eccentric proximal LAD, moderate proximal circumflex (eccentric 70 %), 75% ostial first obtuse marginal, 99% OM 2, and chronically occluded RCA with left-to-right collaterals. Distal RCA is tiny and may not be graftable.  Obtain CMRI for viability  CT surgery to evaluated.  Possible high risk PCI -On statin. Continue heparin drip  -Plavix on hold for possible CT surgery.  3. NSVT Has had PVCs and NSVT this admit.  -May need to add amio.  ? Scar medicated.  -CMRI tomorrow.   4. HLD On statin.   5. PAD H/O R Fem Pop in 2005 Has been on plavix. Hold for now due cath.   6. ID Elevated WBC 11.6.  Afebrile.   CMRI today.   Length of Stay: 2  Darrick Grinder, NP  01/03/2020, 7:21 AM  Advanced Heart Failure Team Pager [phone] (M-F; 7a - 4p)  Please contact [facility] for night-coverage after hours (4p -7a ) and weekends on [email or web address]  Patient seen with NP, agree with the above note.   No complaints today, no chets pain or dyspnea.   General: NAD Neck: No JVD, no thyromegaly or thyroid nodule.  Lungs: Clear to auscultation bilaterally with normal respiratory effort. CV: Nondisplaced PMI.  Heart regular S1/S2, no S3/S4, no murmur.  No peripheral edema.    Abdomen: Soft, nontender, no hepatosplenomegaly, no distention.  Skin: Intact without lesions or rashes.  Neurologic: Alert and oriented x 3.  Psych: Normal affect. Extremities: No clubbing or cyanosis.  HEENT: Normal.   1. CAD: NSTEMI with cath showing totally occluded RCA with collaterals from LAD, 95% proximal LAD, 80% OM1 and 80% OM2.  Suspect he would be best-served by CABG.   - Continue ASA 81 daily.  - Continue atorvastatin 80 mg daily.  - Continue heparin gtt.  - Will arrange for cardiac MRI to assess for viability though would have a low  threshold to operate given age and functionality (working full time at [employer]).  2. Acute systolic CHF: Ischemic cardiomyopathy.  Echo this admission with EF < 20%, normal RV.  CI mildly decreased at 1.93 with mildly elevated PCWP and normal RA pressure. Patient is comfortable at rest.  He received 1 dose of Entresto and BP dropped significantly, now recovered. Good diuresis yesterday, looks euvolemic on exam today.  - Discussed with Dr. Prescott Gum, will place PICC line and follow CVP/co-ox, will start milrinone 0.25 pre-op given low CI on RHC yesterday.   - Will transition to po Lasix today.  - Continue digoxin 0.125 daily.  - Increase spironolactone to 25 mg daily.  - Would not retry Entresto given history of angioedema with ACEI.  Will start losartan 12.5 mg daily.  3. NSVT: Noted runs this admission.   - With likely CABG in future, started amiodarone 200 mg bid.  4. Active smoker: Needs to quit, counseled.  5. PAD: h/o right fem-pop bypass.   Loralie Champagne 01/03/2020 8:23 AM

## 2020-01-03 NOTE — Progress Notes (Signed)
ANTICOAGULATION CONSULT NOTE  Pharmacy Consult for IV heparin Indication: chest pain/ACS  Allergies  Allergen Reactions  . Lisinopril Swelling    Angioedema in 2020    Patient Measurements: Height: 5' 10.5" (179.1 cm) Weight: 57.6 kg (126 lb 15.8 oz) IBW/kg (Calculated) : 74.15 Heparin Dosing Weight: TBW  Vital Signs: Temp: 98.1 F (36.7 C) (10/13 0448) Temp Source: Oral (10/13 0448) BP: 118/81 (10/13 1200) Pulse Rate: 66 (10/13 0448)  Labs: Recent Labs    01/17/2020 0552 01/07/2020 0552 01/13/2020 0835 12/30/2019 1236 01/18/2020 1817 2020/01/07 0419 01/07/2020 0955 Jan 07, 2020 1001 Jan 07, 2020 1001 01/07/20 1011 01/03/20 0031 01/03/20 1007  HGB 12.7*   < >  --   --   --  13.7   < > 15.6   < > 14.6 13.5  --   HCT 42.2   < >  --   --   --  43.2   < > 46.0  --  43.0 42.6  --   PLT 207  --   --   --   --  220  --   --   --   --  220  --   LABPROT  --   --   --   --   --  14.1  --   --   --   --  13.4  --   INR  --   --   --   --   --  1.1  --   --   --   --  1.1  --   HEPARINUNFRC  --   --   --   --    < > 0.29*  --   --   --   --  0.28* 0.47  CREATININE 0.90   < >  --  1.14  --  1.17  --   --   --   --  1.09  --   TROPONINIHS 1,187*  --  4,615*  --   --   --   --   --   --   --   --   --    < > = values in this interval not displayed.    Estimated Creatinine Clearance: 52.1 mL/min (by C-G formula based on SCr of 1.09 mg/dL).   Medical History: Past Medical History:  Diagnosis Date  . Diverticulosis   . Hyperlipidemia   . Hypertension   . Internal hemorrhoids   . PVD (peripheral vascular disease) (HCC)     Medications:  Medications Prior to Admission  Medication Sig Dispense Refill Last Dose  . amLODipine (NORVASC) 10 MG tablet Take 10 mg by mouth daily.   12/31/2019 at Unknown time  . aspirin 325 MG tablet Take 325 mg by mouth daily.   12/31/2019 at Unknown time  . atorvastatin (LIPITOR) 80 MG tablet Take 40 mg by mouth at bedtime.    12/30/2019 at Unknown time  .  Cholecalciferol 50 MCG (2000 UT) TABS Take 4,000 Units by mouth daily.   12/31/2019 at Unknown time  . clopidogrel (PLAVIX) 75 MG tablet Take 75 mg by mouth at bedtime.    12/30/2019  . tadalafil (CIALIS) 10 MG tablet Take 5 mg by mouth daily as needed for erectile dysfunction.   > 1 month    Assessment: 37 yoM with no prior cardiac history but PMH HTN/HLD, PAD, tobacco & EtOH, admitted for acute SOB. Found to have NSTEMI with acutely reduced LVEF 20%. Pharmacy to dose IV  heparin.  Heparin drip 1200 uts/hr, HL 0.47 this am 3vCAD deciding PCI vs CABG  Goal of Therapy: Heparin level 0.3-0.7 units/ml Monitor platelets by anticoagulation protocol: Yes  Plan: Heparin at 1200 units/hr Daily CBC/heparin level  Sheppard Coil PharmD., BCPS Clinical Pharmacist 01/03/2020 12:55 PM

## 2020-01-03 NOTE — Progress Notes (Signed)
Peripherally Inserted Central Catheter Placement  The IV Nurse has discussed with the patient and/or persons authorized to consent for the patient, the purpose of this procedure and the potential benefits and risks involved with this procedure.  The benefits include less needle sticks, lab draws from the catheter, and the patient may be discharged home with the catheter. Risks include, but not limited to, infection, bleeding, blood clot (thrombus formation), and puncture of an artery; nerve damage and irregular heartbeat and possibility to perform a PICC exchange if needed/ordered by physician.  Alternatives to this procedure were also discussed.  Bard Power PICC patient education guide, fact sheet on infection prevention and patient information card has been provided to patient /or left at bedside.    PICC Placement Documentation  PICC Double Lumen 01/03/20 PICC Right Basilic 43 cm 1 cm (Active)  Indication for Insertion or Continuance of Line Chronic illness with exacerbations (CF, Sickle Cell, etc.) 01/03/20 1000  Exposed Catheter (cm) 1 cm 01/03/20 1000  Site Assessment Clean;Dry;Intact 01/03/20 1000  Lumen #1 Status Flushed;Saline locked;Blood return noted 01/03/20 1000  Lumen #2 Status Flushed;Saline locked;Blood return noted 01/03/20 1000  Dressing Type Transparent;Securing device 01/03/20 1000  Dressing Status Clean;Intact;Dry 01/03/20 1000  Antimicrobial disc in place? Yes 01/03/20 1000  Line Care Connections checked and tightened 01/03/20 1000  Dressing Intervention New dressing 01/03/20 1000  Dressing Change Due 01/10/20 01/03/20 1000       Franne Grip Renee 01/03/2020, 10:49 AM

## 2020-01-03 NOTE — Progress Notes (Signed)
PROGRESS NOTE    Adrian Phillips  ZES:[id number] DOB: 1951/03/05 DOA: 01/15/2020 PCP: Bea Laura, MD    Brief Narrative:  Adrian Phillips is a 69 year old male with past medical history notable for essential hypertension, hyperlipidemia, peripheral vascular disease, tobacco use disorder, EtOH abuse who presented to the emergency department with worsening shortness of breath.  Patient also reports increased lower extremity edema over the past few weeks.  In the ED, chest x-ray notable for vascular congestion, EKG with sinus tachycardia, LVH.  SBP elevated 190s.  Troponin elevated 1187 with a BNP of 269.  WBC count 19.2.  Patient was started on IV Lasix, nitroglycerin drip, and heparin drip.  Cardiology was consulted.  TRH consulted for admission for acute CHF exacerbation with hypertensive urgency and NSTEMI.   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  Chest x-ray notable for basilar congestion with elevated BNP. TTE 12/25/2019 with LVEF less than 20%, LV severely decreased function with global hypokinesis, grade 2 diastolic dysfunction, LA mildly dilated, moderate MR, IVC dilated. (Hx angioedema with ACEi, also avoiding Entresto) --Cardiology/CTS following, appreciate assistance --net negative 2.2L past 24h, net negative 4.0L since admission --Digoxin 0.125 mg p.o. daily --Spironolactone 25  mg p.o. daily --Losartan 12.5 mg p.o. daily --Lasix 43m PO daily --PICC line placed 10/13 for milrinone infusion for low cardiac index on LHC --Cardiac MRI pending to assess viability  NSTEMI Multivessel coronary artery disease Patient presenting with elevated troponin 1187, 4615.  Underwent left heart catheterization on 01/21/2020 with severely calcified eccentric proximal LAD, moderate proximal circumflex 70%, 75% ostial first obtuse marginal, 99% OM 2 and chronically occluded RCA with left-to-right collaterals. --Cardiology/CTS following, appreciate assistance --Pending cardiac MRI for viability --Continue atorvastatin 80 mg p.o. daily --Holding Plavix for possible CT surgery  Nonsustained ventricular tachycardia --Started on amiodarone 200 mg p.o. BID --Monitor on telemetry  Hypertensive emergency: Resolved Presenting with SBP's 190s.  Initially started on nitroglycerin drip, which has now been titrated off. --BP 123/59, much better controlled --Started on losartan 12.5 g p.o. daily, spironolactone 25 mg p.o. daily, furosemide 432mPO daily  Peripheral vascular disease with lower right lower extremity bypass History of right femoropopliteal 2005. --Holding Plavix as above for possible need of surgical invention --Continue statin  Tobacco use disorder Alcohol abuse Discussed need for complete EtOH cessation --Thiamine, folic acid, multivitamin   DVT prophylaxis: Heparin drip Code Status: Full code Family Communication: Updated patient extensively at bedside  Disposition Plan:  Status is: Inpatient  Remains inpatient appropriate because:Ongoing diagnostic testing needed not appropriate for outpatient work up, Unsafe d/c plan, IV treatments appropriate due to intensity of illness or inability to take PO and Inpatient level of care appropriate due to severity of illness   Dispo: The patient is from: Home              Anticipated d/c is to: Home              Anticipated  d/c date is:  3 days              Patient currently is not medically stable to d/c.   Consultants:   Cardiology  Cardiothoracic surgery  Procedures:  TTE 01/09/2020: IMPRESSIONS  1. Left ventricular ejection fraction, by estimation, is <20%. The left  ventricle has severely decreased function. The left ventricle demonstrates  global hypokinesis. The left ventricular internal cavity size was  moderately dilated. Left ventricular  diastolic parameters are consistent with Grade II diastolic dysfunction  (pseudonormalization). Elevated left ventricular end-diastolic pressure.  2. Right ventricular systolic function is normal. The right ventricular  size is normal. There is mildly elevated pulmonary artery systolic  pressure.  3. Left atrial size was mildly dilated.  4. The mitral valve is normal in structure. Moderate mitral valve  regurgitation.  5. The aortic valve is tricuspid. Aortic valve regurgitation is not  visualized. No aortic stenosis is present.  6. The inferior vena cava is dilated in size with <50% respiratory  variability, suggesting right atrial pressure of 15 mmHg.   Antimicrobials:   None   Subjective: Patient seen and examined bedside, resting comfortably.  Shortness of breath has resolved, awaiting cardiac MRI later today.  No other specific complaints or concerns at this time.  Denies headache, no visual changes, no chest pain, palpitations, shortness of breath, no abdominal pain, no weakness, no fatigue, no paresthesias.  No acute events overnight per nursing staff.  Objective: Vitals:   01/03/20 0448 01/03/20 0600 01/03/20 1000 01/03/20 1127  BP: 103/66 (!) 123/59 (!) 106/56 98/73  Pulse: 66     Resp: 20 20  13   Temp: 98.1 F (36.7 C)     TempSrc: Oral     SpO2:      Weight:      Height:        Intake/Output Summary (Last 24 hours) at 01/03/2020 1204 Last data filed at 01/03/2020 1102 Gross per 24 hour  Intake 109.62 ml  Output 1850 ml    Net -1740.38 ml   Filed Weights   12/30/2019 0214 12/31/2019 1000 01/03/2020 0500  Weight: 67.6 kg 57.7 kg 57.6 kg    Examination:  General exam: Appears calm and comfortable  Respiratory system: Clear to auscultation. Respiratory effort normal.  Oxygenating well on room air Cardiovascular system: S1 & S2 heard, RRR. No JVD, murmurs, rubs, gallops or clicks. No pedal edema. Gastrointestinal system: Abdomen is nondistended, soft and nontender. No organomegaly or masses felt. Normal bowel sounds heard. Central nervous system: Alert and oriented. No focal neurological deficits. Extremities: Symmetric 5 x 5 power. Skin: No rashes, lesions or ulcers Psychiatry: Judgement and insight appear normal. Mood & affect appropriate.     Data Reviewed: I have personally reviewed following labs and imaging studies  CBC: Recent Labs  Lab 01/12/2020 0151 01/20/2020 0151 01/10/2020 0552 01/21/2020 0552 01/13/2020 0419 01/12/2020 0419 01/19/2020 0955 12/31/2019 1000 01/21/2020 1001 12/22/2019 1011 01/03/20 0031  WBC 19.2*  --  12.1*  --  15.8*  --   --   --   --   --  11.6*  NEUTROABS 15.5*  --  11.4*  --   --   --   --   --   --   --   --   HGB 16.0   < > 12.7*   < > 13.7   < > 16.0 15.6 15.6 14.6 13.5  HCT 51.1   < > 42.2   < > 43.2   < > 47.0  46.0 46.0 43.0 42.6  MCV 93.9  --  93.4  --  89.6  --   --   --   --   --  89.3  PLT 273  --  207  --  220  --   --   --   --   --  220   < > = values in this interval not displayed.   Basic Metabolic Panel: Recent Labs  Lab 01/10/2020 0151 12/31/2019 0151 12/28/2019 0552 12/25/2019 0552 01/19/2020 1236 01/18/2020 1236 12/23/2019 0419 01/17/2020 0419 01/07/2020 0955 12/25/2019 1000 12/26/2019 1001 12/28/2019 1011 01/03/20 0031  NA 140   < > 140   < > 138   < > 141   < > 141 133* 141 140 139  K 3.5   < > 5.1   < > 4.2   < > 4.5   < > 4.0 3.9 4.0 3.7 3.9  CL 106  --  107  --  101  --  103  --   --   --   --   --  103  CO2 24  --  23  --  26  --  30  --   --   --   --   --  25   GLUCOSE 174*  --  136*  --  157*  --  132*  --   --   --   --   --  104*  BUN 12  --  15  --  18  --  27*  --   --   --   --   --  19  CREATININE 0.90  --  0.90  --  1.14  --  1.17  --   --   --   --   --  1.09  CALCIUM 8.9  --  8.4*  --  9.2  --  8.5*  --   --   --   --   --  8.6*  MG  --   --  2.2  --  2.2  --   --   --   --   --   --   --  2.0   < > = values in this interval not displayed.   GFR: Estimated Creatinine Clearance: 52.1 mL/min (by C-G formula based on SCr of 1.09 mg/dL). Liver Function Tests: Recent Labs  Lab 01/10/2020 0552 01/03/20 0031  AST 72* 53*  ALT 37 35  ALKPHOS 99 83  BILITOT 0.5 0.4  PROT 6.6 5.6*  ALBUMIN 3.6 3.0*   No results for input(s): LIPASE, AMYLASE in the last 168 hours. No results for input(s): AMMONIA in the last 168 hours. Coagulation Profile: Recent Labs  Lab 01/06/2020 0419 01/03/20 0031  INR 1.1 1.1   Cardiac Enzymes: No results for input(s): CKTOTAL, CKMB, CKMBINDEX, TROPONINI in the last 168 hours. BNP (last 3 results) No results for input(s): PROBNP in the last 8760 hours. HbA1C: Recent Labs    12/29/2019 0553  HGBA1C 5.4   CBG: No results for input(s): GLUCAP in the last 168 hours. Lipid Profile: Recent Labs    12/31/2019 1236  CHOL 144  HDL 82  LDLCALC 57  TRIG 26  CHOLHDL 1.8   Thyroid Function Tests: Recent Labs    01/03/20 0031  TSH 2.080   Anemia Panel: No results for input(s): VITAMINB12, FOLATE, FERRITIN, TIBC, IRON, RETICCTPCT in the last 72 hours. Sepsis Labs: No results for input(s): PROCALCITON,  LATICACIDVEN in the last 168 hours.  Recent Results (from the past 240 hour(s))  Respiratory Panel by RT PCR (Flu A&B, Covid) - Nasopharyngeal Swab     Status: None   Collection Time: 12/28/2019  1:51 AM   Specimen: Nasopharyngeal Swab  Result Value Ref Range Status   SARS Coronavirus 2 by RT PCR NEGATIVE NEGATIVE Final    Comment: (NOTE) SARS-CoV-2 target nucleic acids are NOT DETECTED.  The SARS-CoV-2  RNA is generally detectable in upper respiratoy specimens during the acute phase of infection. The lowest concentration of SARS-CoV-2 viral copies this assay can detect is 131 copies/mL. A negative result does not preclude SARS-Cov-2 infection and should not be used as the sole basis for treatment or other patient management decisions. A negative result may occur with  improper specimen collection/handling, submission of specimen other than nasopharyngeal swab, presence of viral mutation(s) within the areas targeted by this assay, and inadequate number of viral copies (<131 copies/mL). A negative result must be combined with clinical observations, patient history, and epidemiological information. The expected result is Negative.  Fact Sheet for Patients:  PinkCheek.be  Fact Sheet for Healthcare Providers:  GravelBags.it  This test is no t yet approved or cleared by the [place] FDA and  has been authorized for detection and/or diagnosis of SARS-CoV-2 by FDA under an Emergency Use Authorization (EUA). This EUA will remain  in effect (meaning this test can be used) for the duration of the COVID-19 declaration under Section 564(b)(1) of the Act, 21 U.S.C. section 360bbb-3(b)(1), unless the authorization is terminated or revoked sooner.     Influenza A by PCR NEGATIVE NEGATIVE Final   Influenza B by PCR NEGATIVE NEGATIVE Final    Comment: (NOTE) The Xpert Xpress SARS-CoV-2/FLU/RSV assay is intended as an aid in  the diagnosis of influenza from Nasopharyngeal swab specimens and  should not be used as a sole basis for treatment. Nasal washings and  aspirates are unacceptable for Xpert Xpress SARS-CoV-2/FLU/RSV  testing.  Fact Sheet for Patients: PinkCheek.be  Fact Sheet for Healthcare Providers: GravelBags.it  This test is not yet approved or cleared by the  [place] FDA and  has been authorized for detection and/or diagnosis of SARS-CoV-2 by  FDA under an Emergency Use Authorization (EUA). This EUA will remain  in effect (meaning this test can be used) for the duration of the  Covid-19 declaration under Section 564(b)(1) of the Act, 21  U.S.C. section 360bbb-3(b)(1), unless the authorization is  terminated or revoked. Performed at [facility], [place] [address] [place], [place] [address]   MRSA PCR Screening     Status: None   Collection Time: 12/28/2019 10:11 AM   Specimen: Nasal Mucosa; Nasopharyngeal  Result Value Ref Range Status   MRSA by PCR NEGATIVE NEGATIVE Final    Comment:        The GeneXpert MRSA Assay (FDA approved for NASAL specimens only), is one component of a comprehensive MRSA colonization surveillance program. It is not intended to diagnose MRSA infection nor to guide or monitor treatment for MRSA infections. Performed at [facility], [place] [address] [place], [place] [address]          Radiology Studies: CARDIAC CATHETERIZATION  Result Date: 01/05/2020  Severe three-vessel coronary disease with total occlusion mid RCA with left-to-right collaterals (distal vessel is small and probably not graftable), high-grade calcified proximal LAD, severe circumflex disease with eccentric proximal 70% stenosis, 80% first obtuse marginal, and 90% third obtuse  marginal.  The left main is widely patent.  Known severe LV systolic dysfunction with EF 25%.  Findings are consistent with acute on chronic combined systolic and diastolic heart failure.  LVEDP is 40 mmHg.  Mild pulmonary hypertension, WHO group 2.  Mean wedge 20 mmHg, PVR 2.1 [place] units. RECOMMENDATIONS:  Heart team debate concerning revascularization.  At his age with multivessel coronary disease, surgery would be a more durable approach.  High risk PCI on the calcified proximal LAD is also possible but would need to be done  with support.  MRI viability study will help clarify potential approach.  Aggressive therapy of acute component of heart failure.  Until conversation with all involved relative to revascularization strategy, will hold Plavix.  Aspirin was started.  [place] EKG SITE RITE  Result Date: 01/03/2020 If Site Rite image not attached, placement could not be confirmed due to current cardiac rhythm.       Scheduled Meds: . amiodarone  200 mg Oral BID  . aspirin EC  81 mg Oral Daily  . atorvastatin  80 mg Oral Daily  . chlorhexidine gluconate (MEDLINE KIT)  15 mL Mouth Rinse BID  . Chlorhexidine Gluconate Cloth  6 each Topical Daily  . digoxin  0.125 mg Oral Daily  . folic acid  1 mg Oral Daily  . furosemide  40 mg Oral Daily  . influenza vaccine adjuvanted  0.5 mL Intramuscular Tomorrow-1000  . losartan  12.5 mg Oral Daily  . mouth rinse  15 mL Mouth Rinse q12n4p  . multivitamin with minerals  1 tablet Oral Daily  . sodium chloride flush  10-40 mL Intracatheter Q12H  . sodium chloride flush  3 mL Intravenous Q12H  . sodium chloride flush  3 mL Intravenous Q12H  . spironolactone  12.5 mg Oral Daily  . thiamine  100 mg Oral Daily   Or  . thiamine  100 mg Intravenous Daily   Continuous Infusions: . sodium chloride 250 mL (12/30/2019 1049)  . sodium chloride    . heparin 1,200 Units/hr (01/03/20 1116)  . milrinone 0.25 mcg/kg/min (01/03/20 1126)  . nitroGLYCERIN Stopped (01/06/2020 1007)     LOS: 2 days    Time spent: 38 minutes spent on chart review, discussion with nursing staff, consultants, updating family and interview/physical exam; more than 50% of that time was spent in counseling and/or coordination of care.    Anahi Belmar J [place] DO [facility] Available via Epic secure chat 7am-7pm After these hours, please refer to coverage provider listed on [email or web address] 01/03/2020, 12:04 PM

## 2020-01-03 NOTE — Plan of Care (Signed)
  Problem: Education: Goal: Knowledge of General Education information will improve Description: Including pain rating scale, medication(s)/side effects and non-pharmacologic comfort measures Outcome: Progressing   Problem: Health Behavior/Discharge Planning: Goal: Ability to manage health-related needs will improve Outcome: Progressing   Problem: Clinical Measurements: Goal: Ability to maintain clinical measurements within normal limits will improve Outcome: Progressing   Problem: Clinical Measurements: Goal: Diagnostic test results will improve Outcome: Progressing   Problem: Clinical Measurements: Goal: Cardiovascular complication will be avoided Outcome: Progressing   Problem: Activity: Goal: Risk for activity intolerance will decrease Outcome: Progressing   Problem: Nutrition: Goal: Adequate nutrition will be maintained Outcome: Progressing   Problem: Pain Managment: Goal: General experience of comfort will improve Outcome: Progressing   Problem: Education: Goal: Ability to demonstrate management of disease process will improve Outcome: Progressing   Problem: Education: Goal: Ability to verbalize understanding of medication therapies will improve Outcome: Progressing

## 2020-01-04 ENCOUNTER — Encounter (HOSPITAL_COMMUNITY): Payer: No Typology Code available for payment source

## 2020-01-04 ENCOUNTER — Inpatient Hospital Stay (HOSPITAL_COMMUNITY): Payer: No Typology Code available for payment source

## 2020-01-04 DIAGNOSIS — J9601 Acute respiratory failure with hypoxia: Secondary | ICD-10-CM | POA: Diagnosis not present

## 2020-01-04 DIAGNOSIS — I25750 Atherosclerosis of native coronary artery of transplanted heart with unstable angina: Secondary | ICD-10-CM | POA: Diagnosis not present

## 2020-01-04 DIAGNOSIS — Z01810 Encounter for preprocedural cardiovascular examination: Secondary | ICD-10-CM | POA: Diagnosis not present

## 2020-01-04 DIAGNOSIS — I25110 Atherosclerotic heart disease of native coronary artery with unstable angina pectoris: Secondary | ICD-10-CM | POA: Diagnosis not present

## 2020-01-04 DIAGNOSIS — I509 Heart failure, unspecified: Secondary | ICD-10-CM

## 2020-01-04 DIAGNOSIS — I2510 Atherosclerotic heart disease of native coronary artery without angina pectoris: Secondary | ICD-10-CM

## 2020-01-04 DIAGNOSIS — I5021 Acute systolic (congestive) heart failure: Secondary | ICD-10-CM

## 2020-01-04 LAB — PULMONARY FUNCTION TEST
DL/VA % pred: 75 %
DL/VA: 3.07 ml/min/mmHg/L
DLCO cor % pred: 48 %
DLCO cor: 12.97 ml/min/mmHg
DLCO unc % pred: 46 %
DLCO unc: 12.3 ml/min/mmHg
FEF 25-75 Post: 0.87 L/sec
FEF 25-75 Pre: 0.76 L/sec
FEF2575-%Change-Post: 15 %
FEF2575-%Pred-Post: 33 %
FEF2575-%Pred-Pre: 29 %
FEV1-%Change-Post: 6 %
FEV1-%Pred-Post: 68 %
FEV1-%Pred-Pre: 64 %
FEV1-Post: 2.03 L
FEV1-Pre: 1.91 L
FEV1FVC-%Change-Post: 10 %
FEV1FVC-%Pred-Pre: 79 %
FEV6-%Change-Post: 0 %
FEV6-%Pred-Post: 76 %
FEV6-%Pred-Pre: 76 %
FEV6-Post: 2.87 L
FEV6-Pre: 2.87 L
FEV6FVC-%Change-Post: 3 %
FEV6FVC-%Pred-Post: 99 %
FEV6FVC-%Pred-Pre: 96 %
FVC-%Change-Post: -3 %
FVC-%Pred-Post: 76 %
FVC-%Pred-Pre: 79 %
FVC-Post: 3.01 L
FVC-Pre: 3.13 L
Post FEV1/FVC ratio: 67 %
Post FEV6/FVC ratio: 95 %
Pre FEV1/FVC ratio: 61 %
Pre FEV6/FVC Ratio: 92 %
RV % pred: 83 %
RV: 2.04 L
TLC % pred: 70 %
TLC: 4.99 L

## 2020-01-04 LAB — BASIC METABOLIC PANEL
Anion gap: 8 (ref 5–15)
BUN: 13 mg/dL (ref 8–23)
CO2: 29 mmol/L (ref 22–32)
Calcium: 8.8 mg/dL — ABNORMAL LOW (ref 8.9–10.3)
Chloride: 103 mmol/L (ref 98–111)
Creatinine, Ser: 0.96 mg/dL (ref 0.61–1.24)
GFR, Estimated: >60 mL/min (ref >60)
Glucose, Bld: 102 mg/dL — ABNORMAL HIGH (ref 70–99)
Potassium: 3.7 mmol/L (ref 3.5–5.1)
Sodium: 140 mmol/L (ref 135–145)

## 2020-01-04 LAB — COOXEMETRY PANEL
Carboxyhemoglobin: 1 % (ref 0.5–1.5)
Carboxyhemoglobin: 1.1 % (ref 0.5–1.5)
Methemoglobin: 1 % (ref 0.0–1.5)
Methemoglobin: 0.8 % (ref 0.0–1.5)
O2 Saturation: 53.7 %
O2 Saturation: 64.4 %
Total hemoglobin: 13.6 g/dL (ref 12.0–16.0)
Total hemoglobin: 13.4 g/dL (ref 12.0–16.0)

## 2020-01-04 LAB — CBC
HCT: 41.8 % (ref 39.0–52.0)
Hemoglobin: 12.9 g/dL — ABNORMAL LOW (ref 13.0–17.0)
MCH: 27.7 pg (ref 26.0–34.0)
MCHC: 30.9 g/dL (ref 30.0–36.0)
MCV: 89.7 fL (ref 80.0–100.0)
Platelets: 220 10*3/uL (ref 150–400)
RBC: 4.66 MIL/uL (ref 4.22–5.81)
RDW: 14.5 % (ref 11.5–15.5)
WBC: 9.5 10*3/uL (ref 4.0–10.5)
nRBC: 0 % (ref 0.0–0.2)

## 2020-01-04 LAB — HEPARIN LEVEL (UNFRACTIONATED): Heparin Unfractionated: 0.35 IU/mL (ref 0.30–0.70)

## 2020-01-04 LAB — MAGNESIUM: Magnesium: 1.9 mg/dL (ref 1.7–2.4)

## 2020-01-04 MED ORDER — IOHEXOL 300 MG/ML  SOLN
75.0000 mL | Freq: Once | INTRAMUSCULAR | Status: AC | PRN
  Administered 2020-01-04: 75 mL via INTRAVENOUS

## 2020-01-04 MED ORDER — FUROSEMIDE 20 MG PO TABS
20.0000 mg | ORAL_TABLET | Freq: Every day | ORAL | Status: DC
  Administered 2020-01-05 – 2020-01-07 (×3): 20 mg via ORAL
  Filled 2020-01-04 (×3): qty 1

## 2020-01-04 MED ORDER — IOHEXOL 350 MG/ML SOLN
75.0000 mL | Freq: Once | INTRAVENOUS | Status: AC | PRN
  Administered 2020-01-04: 75 mL via INTRAVENOUS

## 2020-01-04 MED ORDER — ENSURE ENLIVE PO LIQD
237.0000 mL | Freq: Three times a day (TID) | ORAL | Status: DC
  Administered 2020-01-05 – 2020-01-07 (×9): 237 mL via ORAL

## 2020-01-04 MED ORDER — ALBUTEROL SULFATE (2.5 MG/3ML) 0.083% IN NEBU
2.5000 mg | INHALATION_SOLUTION | Freq: Once | RESPIRATORY_TRACT | Status: AC
  Administered 2020-01-04: 2.5 mg via RESPIRATORY_TRACT

## 2020-01-04 NOTE — Plan of Care (Signed)
  Problem: Education: Goal: Knowledge of General Education information will improve Description: Including pain rating scale, medication(s)/side effects and non-pharmacologic comfort measures Outcome: Progressing   Problem: Clinical Measurements: Goal: Ability to maintain clinical measurements within normal limits will improve Outcome: Progressing   Problem: Clinical Measurements: Goal: Diagnostic test results will improve Outcome: Progressing   Problem: Clinical Measurements: Goal: Cardiovascular complication will be avoided Outcome: Progressing   Problem: Nutrition: Goal: Adequate nutrition will be maintained Outcome: Progressing   Problem: Pain Managment: Goal: General experience of comfort will improve Outcome: Progressing   Problem: Education: Goal: Ability to verbalize understanding of medication therapies will improve Outcome: Progressing

## 2020-01-04 NOTE — Evaluation (Signed)
Physical Therapy Evaluation Patient Details Name: Adrian Phillips MRN: [id number] DOB: 1950-08-06 Today's Date: 01/04/2020   History of Present Illness  Pt adm 10/11 with acute chf and NSTEMI. Pt with CAD and CABG planned likely for 10/18. PMH - essential HTN, PVD  Clinical Impression  Pt moving independently for long distances in hallways. Instructed pt in coming sternal precautions after CABG and pt demonstrated good understanding of these and practiced bed mobility and transfers following these precautions. Instructed pt to continue to amb in the hallways on his own at least 3 times/day until surgery. He will need a staff member to put him on tele box or move the portable monitor to the IV pole so he can mobilize in the hallways on his own.     Follow Up Recommendations No PT follow up (may change after CABG)    Equipment Recommendations  Other (comment) (may change after CABG)    Recommendations for Other Services       Precautions / Restrictions Precautions Precautions: None      Mobility  Bed Mobility Overal bed mobility: Independent                Transfers Overall transfer level: Independent                  Ambulation/Gait Ambulation/Gait assistance: Independent Gait Distance (Feet): 1200 Feet Assistive device: None Gait Pattern/deviations: WFL(Within Functional Limits) Gait velocity: normal Gait velocity interpretation: >4.37 ft/sec, indicative of normal walking speed General Gait Details: Steady gait   Stairs            Wheelchair Mobility    Modified Rankin (Stroke Patients Only)       Balance Overall balance assessment: No apparent balance deficits (not formally assessed)                                           Pertinent Vitals/Pain Pain Assessment: No/denies pain    Home Living Family/patient expects to be discharged to:: Private residence Living Arrangements: Alone Available Help at Discharge:  Friend(s);Available 24 hours/day Type of Home: House       Home Layout: Two level;Bed/bath upstairs;1/2 bath on main level Home Equipment: None      Prior Function Level of Independence: Independent               Hand Dominance        Extremity/Trunk Assessment   Upper Extremity Assessment Upper Extremity Assessment: Overall WFL for tasks assessed    Lower Extremity Assessment Lower Extremity Assessment: Overall WFL for tasks assessed       Communication   Communication: No difficulties  Cognition Arousal/Alertness: Awake/alert Behavior During Therapy: WFL for tasks assessed/performed Overall Cognitive Status: Within Functional Limits for tasks assessed                                        General Comments General comments (skin integrity, edema, etc.): VSS    Exercises     Assessment/Plan    PT Assessment Patent does not need any further PT services (at this time. May need post cabg)  PT Problem List         PT Treatment Interventions      PT Goals (Current goals can be found in the Care Plan section)  Acute Rehab PT Goals PT Goal Formulation: All assessment and education complete, DC therapy    Frequency     Barriers to discharge        Co-evaluation               AM-PAC PT "6 Clicks" Mobility  Outcome Measure Help needed turning from your back to your side while in a flat bed without using bedrails?: None Help needed moving from lying on your back to sitting on the side of a flat bed without using bedrails?: None Help needed moving to and from a bed to a chair (including a wheelchair)?: None Help needed standing up from a chair using your arms (e.g., wheelchair or bedside chair)?: None Help needed to walk in hospital room?: None Help needed climbing 3-5 steps with a railing? : None 6 Click Score: 24    End of Session   Activity Tolerance: Patient tolerated treatment well Patient left: in bed;with call  bell/phone within reach;Other (comment) (physician present) Nurse Communication: Mobility status PT Visit Diagnosis: Other abnormalities of gait and mobility (R26.89)    Time: 1856-3149 PT Time Calculation (min) (ACUTE ONLY): 21 min   Charges:   PT Evaluation $PT Eval Low Complexity: 1 Low          [facility] PT Acute Rehabilitation Services Pager [phone] Office [phone]   Angelina Ok [facility] 01/04/2020, 3:44 PM

## 2020-01-04 NOTE — Progress Notes (Signed)
Patient ID: Adrian Phillips, male   DOB: May 19, 1950, 69 y.o.   MRN: [id number]     Advanced Heart Failure Rounding Note  PCP-Cardiologist: Jenkins Rouge, MD   Subjective:    No complaints today.  Started on milrinone 0.25, good co-ox today at 64%.  CVP 4.   Cardiac MRI:  1. Moderate LVE with diffuse hypokinesis worse in the septum and apex EF 26% 2. Post gadolinium images with sub-endocardial scar in the mid distal anterior wall, septum and apex. No transmural or full thickness scar suggesting possibility of LVEF recovery with revascularization.    Objective:   Weight Range: 55.9 kg Body mass index is 17.43 kg/m.   Vital Signs:   Temp:  [97.6 F (36.4 C)-98.4 F (36.9 C)] 98.4 F (36.9 C) (10/14 1100) Pulse Rate:  [68-80] 77 (10/14 0400) Resp:  [10-25] 19 (10/14 1100) BP: (104-117)/(46-77) 108/70 (10/14 0400) SpO2:  [98 %] 98 % (10/13 1952) Weight:  [55.9 kg] 55.9 kg (10/14 0640) Last BM Date: 01/03/20  Weight change: Filed Weights   12/29/2019 1000 12/26/2019 0500 01/04/20 0640  Weight: 57.7 kg 57.6 kg 55.9 kg    Intake/Output:   Intake/Output Summary (Last 24 hours) at 01/04/2020 1216 Last data filed at 01/04/2020 1287 Gross per 24 hour  Intake 553.67 ml  Output 850 ml  Net -296.33 ml      Physical Exam    General: NAD Neck: No JVD, no thyromegaly or thyroid nodule.  Lungs: Clear to auscultation bilaterally with normal respiratory effort. CV: Nondisplaced PMI.  Heart regular S1/S2, no S3/S4, no murmur.  No peripheral edema.   Abdomen: Soft, nontender, no hepatosplenomegaly, no distention.  Skin: Intact without lesions or rashes.  Neurologic: Alert and oriented x 3.  Psych: Normal affect. Extremities: No clubbing or cyanosis.  HEENT: Normal.    Telemetry   NSR 70s-80s, no PVCs today.   EKG    n/a  Labs    CBC Recent Labs    01/03/20 0031 01/04/20 0300  WBC 11.6* 9.5  HGB 13.5 12.9*  HCT 42.6 41.8  MCV 89.3 89.7  PLT 220 867    Basic Metabolic Panel Recent Labs    01/03/20 0031 01/04/20 0300  NA 139 140  K 3.9 3.7  CL 103 103  CO2 25 29  GLUCOSE 104* 102*  BUN 19 13  CREATININE 1.09 0.96  CALCIUM 8.6* 8.8*  MG 2.0 1.9   Liver Function Tests Recent Labs    01/03/20 0031  AST 53*  ALT 35  ALKPHOS 83  BILITOT 0.4  PROT 5.6*  ALBUMIN 3.0*   No results for input(s): LIPASE, AMYLASE in the last 72 hours. Cardiac Enzymes No results for input(s): CKTOTAL, CKMB, CKMBINDEX, TROPONINI in the last 72 hours.  BNP: BNP (last 3 results) Recent Labs    01/09/2020 0151  BNP 269.4*    ProBNP (last 3 results) No results for input(s): PROBNP in the last 8760 hours.   D-Dimer No results for input(s): DDIMER in the last 72 hours. Hemoglobin A1C No results for input(s): HGBA1C in the last 72 hours. Fasting Lipid Panel Recent Labs    01/10/2020 1236  CHOL 144  HDL 82  LDLCALC 57  TRIG 26  CHOLHDL 1.8   Thyroid Function Tests Recent Labs    01/03/20 0031  TSH 2.080    Other results:   Imaging    MR CARDIAC MORPHOLOGY W WO CONTRAST  Result Date: 01/03/2020 CLINICAL DATA:  Ischemic Cardiomyopathy / Viability  EXAM: CARDIAC MRI TECHNIQUE: The patient was scanned on a 1.5 Tesla Siemens magnet. A dedicated cardiac coil was used. Functional imaging was done using Fiesta sequences. 2,3, and 4 chamber views were done to assess for RWMA's. Modified Simpson's rule using a short axis stack was used to calculate an ejection fraction on a dedicated work Conservation officer, nature. The patient received 8 cc of Gadavist. After 10 minutes inversion recovery sequences were used to assess for infiltration and scar tissue. CONTRAST:  Gadavist FINDINGS: Mild LAE. Normal RA/RV size. Normal RV function. No ASD/PFO Normal aortic root 2.8 cm. Mild appearing MR. AV tri leaflet with some turbulent flow suggesting sclerosis but not stenosis There is a small pericardial effusion. There is moderate LVE with diffuse  hypokinesis worse in the septum and apex. Quantitative EF 25% (EDV 154 cc ESV 114 cc SV 40 cc) Delayed enhancement images show subendocardial scar in the mid/distal anterior wall, entire septum and apex. There is no mural apical thrombus IMPRESSION: 1. Moderate LVE with diffuse hypokinesis worse in the septum and apex EF 26% 2. Post gadolinium images with sub-endocardial scar in the mid distal anterior wall, septum and apex. No transmural or full thickness scar suggesting possibility Of LVEF recovery with revascularization 3.  Small pericardial effusion 4.  Mild appearing MR 5.  AV sclerosis 6.  Mild LAE Jenkins Rouge Electronically Signed   By: Jenkins Rouge M.D.   On: 01/03/2020 17:36     Medications:     Scheduled Medications: . amiodarone  200 mg Oral BID  . aspirin EC  81 mg Oral Daily  . atorvastatin  80 mg Oral Daily  . chlorhexidine gluconate (MEDLINE KIT)  15 mL Mouth Rinse BID  . Chlorhexidine Gluconate Cloth  6 each Topical Daily  . digoxin  0.125 mg Oral Daily  . folic acid  1 mg Oral Daily  . furosemide  40 mg Oral Daily  . influenza vaccine adjuvanted  0.5 mL Intramuscular Tomorrow-1000  . losartan  12.5 mg Oral Daily  . mouth rinse  15 mL Mouth Rinse q12n4p  . multivitamin with minerals  1 tablet Oral Daily  . sodium chloride flush  10-40 mL Intracatheter Q12H  . sodium chloride flush  3 mL Intravenous Q12H  . sodium chloride flush  3 mL Intravenous Q12H  . spironolactone  25 mg Oral Daily  . thiamine  100 mg Oral Daily   Or  . thiamine  100 mg Intravenous Daily    Infusions: . sodium chloride 250 mL (12/23/2019 1049)  . sodium chloride    . heparin 1,200 Units/hr (01/03/20 1116)  . milrinone 0.25 mcg/kg/min (01/04/20 0311)    PRN Medications: sodium chloride, sodium chloride, acetaminophen, ondansetron (ZOFRAN) IV, oxyCODONE, sodium chloride flush, sodium chloride flush     Assessment/Plan   1. CAD: NSTEMI with cath showing totally occluded RCA with  collaterals from LAD, 95% proximal LAD, 80% OM1 and 80% OM2.  Suspect he would be best-served by CABG.  Cardiac MRI showed significant viability, would expect to see improvement in LV function with revascularization.  - Continue ASA 81 daily.  - Continue atorvastatin 80 mg daily.  - Continue heparin gtt.  - CABG timing per TCTS.  2. Acute systolic CHF: Ischemic cardiomyopathy.  Echo this admission with EF < 20%, normal RV.  RHC showed CI mildly decreased at 1.93 with mildly elevated PCWP and normal RA pressure. He received 1 dose of Entresto and BP dropped significantly, now recovered. Now with  PICC in place and on milrinone 0.25, co-ox 64% today with CVP 4. Pre-op cardiac MRI shows significant viability, would expect to see improved LV function with revascularization.  - Continue milrinone 0.25 pre-op.  - Can decrease Lasix to 20 mg po daily.  - Continue digoxin 0.125 daily.  - Continue spironolactone 25 mg daily.  - Would not retry Entresto given history of angioedema with ACEI.  Continue losartan 12.5 mg daily.  3. NSVT: Noted runs this admission.  No ectopy noted now that he is on amiodarone.  - With likely CABG in future, started amiodarone 200 mg bid.  4. Active smoker: Needs to quit, counseled.  5. PAD: h/o right fem-pop bypass.   Loralie Champagne 01/04/2020 12:16 PM

## 2020-01-04 NOTE — Progress Notes (Signed)
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-80%, asymptomatic Vein map shows adequate saph vein in Left leg [s/p R fem-pop] Plan High risk CABG with direct Impella 5.5 on Mon after Plavix washout Objective: Vital signs in last 24 hours: Temp:  [97.6 F (36.4 C)-98.4 F (36.9 C)] 98.4 F (36.9 C) (10/14 1100) Pulse Rate:  [68-80] 68 (10/14 0800) Cardiac Rhythm: Normal sinus rhythm (10/14 0700) Resp:  [10-21] 19 (10/14 1100) BP: (104-117)/(51-77) 107/51 (10/14 0800) SpO2:  [98 %] 98 % (10/13 1952) Weight:  [55.9 kg] 55.9 kg (10/14 0640)  Hemodynamic parameters for last 24 hours: CVP:  [4 mmHg-5 mmHg] 4 mmHg  Intake/Output from previous day: 10/13 0701 - 10/14 0700 In: 1442.6 [P.O.:1100; I.V.:342.6] Out: 1100 [Urine:1100] Intake/Output this shift: Total I/O In: -  Out: 1300 [Urine:1300]  nsr Lungs clear   Lab Results: Recent Labs    01/03/20 0031 01/04/20 0300  WBC 11.6* 9.5  HGB 13.5 12.9*  HCT 42.6 41.8  PLT 220 220   BMET:  Recent Labs    01/03/20 0031 01/04/20 0300  NA 139 140  K 3.9 3.7  CL 103 103  CO2 25 29  GLUCOSE 104* 102*  BUN 19 13  CREATININE 1.09 0.96  CALCIUM 8.6* 8.8*    PT/INR:  Recent Labs    01/03/20 0031  LABPROT 13.4  INR 1.1   ABG    Component Value Date/Time   PHART 7.456 (H) 01/03/2020 0250   HCO3 27.9 01/03/2020 0250   TCO2 30 01/10/2020 1011   O2SAT 64.4 01/04/2020 0810   CBG (last 3)  No results for input(s): GLUCAP in the last 72 hours.  Assessment/Plan: S/P Procedure(s) (LRB): RIGHT/LEFT HEART CATH AND CORONARY ANGIOGRAPHY (N/A) Cont heparin, milrinone for low EF with low cardiac output high risk CABG Monday with direct Impella 5.5  LOS: 3 days    Kathlee Nations Trigt III 01/04/2020

## 2020-01-04 NOTE — Progress Notes (Signed)
PROGRESS NOTE    Adrian Phillips  OFH:[id number] DOB: 04/20/1950 DOA: 01/15/2020 PCP: Adrian Laura, MD    Brief Narrative:  Adrian Phillips is a 69 year old male with past medical history notable for essential hypertension, hyperlipidemia, peripheral vascular disease, tobacco use disorder, EtOH abuse who presented to the emergency department with worsening shortness of breath.  Patient also reports increased lower extremity edema over the past few weeks.  In the ED, chest x-ray notable for vascular congestion, EKG with sinus tachycardia, LVH.  SBP elevated 190s.  Troponin elevated 1187 with a BNP of 269.  WBC count 19.2.  Patient was started on IV Lasix, nitroglycerin drip, and heparin drip.  Cardiology was consulted.  TRH consulted for admission for acute CHF exacerbation with hypertensive urgency and NSTEMI.   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  Chest x-ray notable for basilar congestion with elevated BNP. TTE 12/25/2019 with LVEF less than 20%, LV severely decreased function with global hypokinesis, grade 2 diastolic dysfunction, LA mildly dilated, moderate MR, IVC dilated. (Hx angioedema with ACEi, also avoiding Entresto).  Cardiac MRI 01/03/2020 diffuse hypokinesis septum/apex with EF 26%, subendocardial scar mid distal anterior wall, septum, apex but no transmural or full-thickness scar  suggesting possibility of LVEF recovery with revascularization --Cardiology/CTS following, appreciate assistance --net positive 346m past 24h with unmeasured occurance, net negative 3.6L since admission --Digoxin 0.125 mg p.o. daily --Spironolactone 25 mg p.o. daily --Losartan 12.5 mg p.o. daily --Lasix 454mPO daily --PICC line placed 10/13 for milrinone infusion for low cardiac index on LHC --pending CABG 01/07/2020  NSTEMI Multivessel coronary artery disease Patient presenting with elevated troponin 1187, 4615.  Underwent left heart catheterization on 01/03/2020 with severely calcified eccentric proximal LAD, moderate proximal circumflex 70%, 75% ostial first obtuse marginal, 99% OM 2 and chronically occluded RCA with left-to-right collaterals. --Cardiology/CTS following, appreciate assistance --Pending cardiac MRI for viability --Continue atorvastatin 80 mg p.o. daily --Holding Plavix for planned CABG 01/04/2020  Nonsustained ventricular tachycardia --Started on amiodarone 200 mg p.o. BID --Monitor on telemetry  Hypertensive emergency: Resolved Presenting with SBP's 190s.  Initially started on nitroglycerin drip, which has now been titrated off. --BP 108/70, this morning --Started on losartan 12.5 g p.o. daily, spironolactone 25 mg p.o. daily, furosemide 4064mO daily  Peripheral vascular disease with lower right lower extremity bypass History of right femoropopliteal 2005. --Holding Plavix as above  --Continue statin  Tobacco use disorder Alcohol abuse Discussed need for complete EtOH cessation --Thiamine, folic acid, multivitamin   DVT prophylaxis: Heparin drip Code Status: Full code Family Communication: Updated patient extensively at bedside  Disposition Plan:  Status is: Inpatient  Remains inpatient appropriate because:Ongoing diagnostic testing needed not appropriate for outpatient work up, Unsafe d/c plan, IV treatments appropriate due to intensity of illness  or inability to take PO and Inpatient level of care appropriate due to severity of illness   Dispo: The patient is from: Home  Anticipated d/c is to: Home              Anticipated d/c date is: > 3 days              Patient currently is not medically stable to d/c.  Pending CABG 12/27/2019   Consultants:   Cardiology  Cardiothoracic surgery  Procedures:  TTE 12/27/2019: IMPRESSIONS  1. Left ventricular ejection fraction, by estimation, is <20%. The left  ventricle has severely decreased function. The left ventricle demonstrates  global hypokinesis. The left ventricular internal cavity size was  moderately dilated. Left ventricular  diastolic parameters are consistent with Grade II diastolic dysfunction  (pseudonormalization). Elevated left ventricular end-diastolic pressure.  2. Right ventricular systolic function is normal. The right ventricular  size is normal. There is mildly elevated pulmonary artery systolic  pressure.  3. Left atrial size was mildly dilated.  4. The mitral valve is normal in structure. Moderate mitral valve  regurgitation.  5. The aortic valve is tricuspid. Aortic valve regurgitation is not  visualized. No aortic stenosis is present.  6. The inferior vena cava is dilated in size with <50% respiratory  variability, suggesting right atrial pressure of 15 mmHg.   Antimicrobials:   None   Subjective: Patient seen and examined bedside, resting comfortably.  Shortness of breath and lower extremity edema continues to be resolved.  No complaints this morning.  Patient's pending CABG planned for 12/29/2019.  No other specific complaints or concerns at this time.  Denies headache, no visual changes, no chest pain, palpitations, shortness of breath, no abdominal pain, no weakness, no fatigue, no paresthesias.  No acute events overnight per nursing staff.  Objective: Vitals:   01/03/20 2336 01/04/20 0400 01/04/20 0640 01/04/20 1100  BP: 104/67  108/70    Pulse: 68 77    Resp: 10 15  19   Temp: 97.6 F (36.4 C) 98.1 F (36.7 C)  98.4 F (36.9 C)  TempSrc: Oral Oral    SpO2:      Weight:   55.9 kg   Height:        Intake/Output Summary (Last 24 hours) at 01/04/2020 1208 Last data filed at 01/04/2020 1610 Gross per 24 hour  Intake 553.67 ml  Output 850 ml  Net -296.33 ml   Filed Weights   12/23/2019 1000 01/07/2020 0500 01/04/20 0640  Weight: 57.7 kg 57.6 kg 55.9 kg    Examination:  General exam: Appears calm and comfortable  Respiratory system: Clear to auscultation. Respiratory effort normal.  Oxygenating well on room air Cardiovascular system: S1 & S2 heard, RRR. No JVD, murmurs, rubs, gallops or clicks. No pedal edema. Gastrointestinal system: Abdomen is nondistended, soft and nontender. No organomegaly or masses felt. Normal bowel sounds heard. Central nervous system: Alert and oriented. No focal neurological deficits. Extremities: Symmetric 5 x 5 power. Skin: No rashes, lesions or ulcers Psychiatry: Judgement and insight appear normal. Mood & affect appropriate.     Data Reviewed: I have personally reviewed following labs and imaging studies  CBC: Recent Labs  Lab 12/24/2019 0151 12/31/2019 0151 01/17/2020 0552 01/16/2020 0552 01/12/2020 0419 01/10/2020 0955 01/10/2020 1000 12/30/2019 1001 01/10/2020 1011 01/03/20 0031 01/04/20 0300  WBC 19.2*  --  12.1*  --  15.8*  --   --   --   --  11.6* 9.5  NEUTROABS 15.5*  --  11.4*  --   --   --   --   --   --   --   --  HGB 16.0   < > 12.7*   < > 13.7   < > 15.6 15.6 14.6 13.5 12.9*  HCT 51.1   < > 42.2   < > 43.2   < > 46.0 46.0 43.0 42.6 41.8  MCV 93.9  --  93.4  --  89.6  --   --   --   --  89.3 89.7  PLT 273  --  207  --  220  --   --   --   --  220 220   < > = values in this interval not displayed.   Basic Metabolic Panel: Recent Labs  Lab 01/17/2020 0552 12/31/2019 0552 01/03/2020 1236 01/15/2020 1236 01/05/2020 0419 01/10/2020 0955 01/18/2020 1000 01/07/2020 1001  01/10/2020 1011 01/03/20 0031 01/04/20 0300  NA 140   < > 138   < > 141   < > 133* 141 140 139 140  K 5.1   < > 4.2   < > 4.5   < > 3.9 4.0 3.7 3.9 3.7  CL 107  --  101  --  103  --   --   --   --  103 103  CO2 23  --  26  --  30  --   --   --   --  25 29  GLUCOSE 136*  --  157*  --  132*  --   --   --   --  104* 102*  BUN 15  --  18  --  27*  --   --   --   --  19 13  CREATININE 0.90  --  1.14  --  1.17  --   --   --   --  1.09 0.96  CALCIUM 8.4*  --  9.2  --  8.5*  --   --   --   --  8.6* 8.8*  MG 2.2  --  2.2  --   --   --   --   --   --  2.0 1.9   < > = values in this interval not displayed.   GFR: Estimated Creatinine Clearance: 57.4 mL/min (by C-G formula based on SCr of 0.96 mg/dL). Liver Function Tests: Recent Labs  Lab 01/13/2020 0552 01/03/20 0031  AST 72* 53*  ALT 37 35  ALKPHOS 99 83  BILITOT 0.5 0.4  PROT 6.6 5.6*  ALBUMIN 3.6 3.0*   No results for input(s): LIPASE, AMYLASE in the last 168 hours. No results for input(s): AMMONIA in the last 168 hours. Coagulation Profile: Recent Labs  Lab 01/10/2020 0419 01/03/20 0031  INR 1.1 1.1   Cardiac Enzymes: No results for input(s): CKTOTAL, CKMB, CKMBINDEX, TROPONINI in the last 168 hours. BNP (last 3 results) No results for input(s): PROBNP in the last 8760 hours. HbA1C: No results for input(s): HGBA1C in the last 72 hours. CBG: No results for input(s): GLUCAP in the last 168 hours. Lipid Profile: Recent Labs    12/30/2019 1236  CHOL 144  HDL 82  LDLCALC 57  TRIG 26  CHOLHDL 1.8   Thyroid Function Tests: Recent Labs    01/03/20 0031  TSH 2.080   Anemia Panel: No results for input(s): VITAMINB12, FOLATE, FERRITIN, TIBC, IRON, RETICCTPCT in the last 72 hours. Sepsis Labs: No results for input(s): PROCALCITON, LATICACIDVEN in the last 168 hours.  Recent Results (from the past 240 hour(s))  Respiratory Panel by RT PCR (Flu A&B, Covid) - Nasopharyngeal  Swab     Status: None   Collection Time: 12/28/2019   1:51 AM   Specimen: Nasopharyngeal Swab  Result Value Ref Range Status   SARS Coronavirus 2 by RT PCR NEGATIVE NEGATIVE Final    Comment: (NOTE) SARS-CoV-2 target nucleic acids are NOT DETECTED.  The SARS-CoV-2 RNA is generally detectable in upper respiratoy specimens during the acute phase of infection. The lowest concentration of SARS-CoV-2 viral copies this assay can detect is 131 copies/mL. A negative result does not preclude SARS-Cov-2 infection and should not be used as the sole basis for treatment or other patient management decisions. A negative result may occur with  improper specimen collection/handling, submission of specimen other than nasopharyngeal swab, presence of viral mutation(s) within the areas targeted by this assay, and inadequate number of viral copies (<131 copies/mL). A negative result must be combined with clinical observations, patient history, and epidemiological information. The expected result is Negative.  Fact Sheet for Patients:  PinkCheek.be  Fact Sheet for Healthcare Providers:  GravelBags.it  This test is no t yet approved or cleared by the [place] FDA and  has been authorized for detection and/or diagnosis of SARS-CoV-2 by FDA under an Emergency Use Authorization (EUA). This EUA will remain  in effect (meaning this test can be used) for the duration of the COVID-19 declaration under Section 564(b)(1) of the Act, 21 U.S.C. section 360bbb-3(b)(1), unless the authorization is terminated or revoked sooner.     Influenza A by PCR NEGATIVE NEGATIVE Final   Influenza B by PCR NEGATIVE NEGATIVE Final    Comment: (NOTE) The Xpert Xpress SARS-CoV-2/FLU/RSV assay is intended as an aid in  the diagnosis of influenza from Nasopharyngeal swab specimens and  should not be used as a sole basis for treatment. Nasal washings and  aspirates are unacceptable for Xpert Xpress SARS-CoV-2/FLU/RSV   testing.  Fact Sheet for Patients: PinkCheek.be  Fact Sheet for Healthcare Providers: GravelBags.it  This test is not yet approved or cleared by the [place] FDA and  has been authorized for detection and/or diagnosis of SARS-CoV-2 by  FDA under an Emergency Use Authorization (EUA). This EUA will remain  in effect (meaning this test can be used) for the duration of the  Covid-19 declaration under Section 564(b)(1) of the Act, 21  U.S.C. section 360bbb-3(b)(1), unless the authorization is  terminated or revoked. Performed at [facility], [place] [address], [place], [place] [address]   MRSA PCR Screening     Status: None   Collection Time: 12/29/2019 10:11 AM   Specimen: Nasal Mucosa; Nasopharyngeal  Result Value Ref Range Status   MRSA by PCR NEGATIVE NEGATIVE Final    Comment:        The GeneXpert MRSA Assay (FDA approved for NASAL specimens only), is one component of a comprehensive MRSA colonization surveillance program. It is not intended to diagnose MRSA infection nor to guide or monitor treatment for MRSA infections. Performed at [facility], [place] [address] [place], [place] [address]          Radiology Studies: MR CARDIAC MORPHOLOGY W WO CONTRAST  Result Date: 01/03/2020 CLINICAL DATA:  Ischemic Cardiomyopathy / Viability EXAM: CARDIAC MRI TECHNIQUE: The patient was scanned on a 1.5 Tesla Siemens magnet. A dedicated cardiac coil was used. Functional imaging was done using Fiesta sequences. 2,3, and 4 chamber views were done to assess for RWMA's. Modified Simpson's rule using a short axis stack was used to calculate an ejection fraction on a  dedicated work Conservation officer, nature. The patient received 8 cc of Gadavist. After 10 minutes inversion recovery sequences were used to assess for infiltration and scar tissue. CONTRAST:  Gadavist FINDINGS: Mild LAE. Normal  RA/RV size. Normal RV function. No ASD/PFO Normal aortic root 2.8 cm. Mild appearing MR. AV tri leaflet with some turbulent flow suggesting sclerosis but not stenosis There is a small pericardial effusion. There is moderate LVE with diffuse hypokinesis worse in the septum and apex. Quantitative EF 25% (EDV 154 cc ESV 114 cc SV 40 cc) Delayed enhancement images show subendocardial scar in the mid/distal anterior wall, entire septum and apex. There is no mural apical thrombus IMPRESSION: 1. Moderate LVE with diffuse hypokinesis worse in the septum and apex EF 26% 2. Post gadolinium images with sub-endocardial scar in the mid distal anterior wall, septum and apex. No transmural or full thickness scar suggesting possibility Of LVEF recovery with revascularization 3.  Small pericardial effusion 4.  Mild appearing MR 5.  AV sclerosis 6.  Mild LAE [place] Electronically Signed   By: Jenkins Rouge M.D.   On: 01/03/2020 17:36   [place] EKG SITE RITE  Result Date: 01/03/2020 If Site Rite image not attached, placement could not be confirmed due to current cardiac rhythm.       Scheduled Meds: . amiodarone  200 mg Oral BID  . aspirin EC  81 mg Oral Daily  . atorvastatin  80 mg Oral Daily  . chlorhexidine gluconate (MEDLINE KIT)  15 mL Mouth Rinse BID  . Chlorhexidine Gluconate Cloth  6 each Topical Daily  . digoxin  0.125 mg Oral Daily  . folic acid  1 mg Oral Daily  . furosemide  40 mg Oral Daily  . influenza vaccine adjuvanted  0.5 mL Intramuscular Tomorrow-1000  . losartan  12.5 mg Oral Daily  . mouth rinse  15 mL Mouth Rinse q12n4p  . multivitamin with minerals  1 tablet Oral Daily  . sodium chloride flush  10-40 mL Intracatheter Q12H  . sodium chloride flush  3 mL Intravenous Q12H  . sodium chloride flush  3 mL Intravenous Q12H  . spironolactone  25 mg Oral Daily  . thiamine  100 mg Oral Daily   Or  . thiamine  100 mg Intravenous Daily   Continuous Infusions: . sodium chloride 250 mL  (01/09/2020 1049)  . sodium chloride    . heparin 1,200 Units/hr (01/03/20 1116)  . milrinone 0.25 mcg/kg/min (01/04/20 0311)     LOS: 3 days    Time spent: 36 minutes spent on chart review, discussion with nursing staff, consultants, updating family and interview/physical exam; more than 50% of that time was spent in counseling and/or coordination of care.    Espen Bethel J [place] DO [facility] Available via Epic secure chat 7am-7pm After these hours, please refer to coverage provider listed on [email or web address] 01/04/2020, 12:08 PM

## 2020-01-04 NOTE — Plan of Care (Signed)

## 2020-01-04 NOTE — Progress Notes (Signed)
Pre-CABG and lower LT saphenous mapping study completed.   Please see CV Proc for preliminary results.   Jean Rosenthal, RDMS

## 2020-01-04 NOTE — Progress Notes (Signed)
ANTICOAGULATION CONSULT NOTE  Pharmacy Consult for IV heparin Indication: chest pain/ACS  Allergies  Allergen Reactions  . Lisinopril Swelling    Angioedema in 2020    Patient Measurements: Height: 5' 10.5" (179.1 cm) Weight: 55.9 kg (123 lb 3.8 oz) IBW/kg (Calculated) : 74.15 Heparin Dosing Weight: TBW  Vital Signs: Temp: 98.1 F (36.7 C) (10/14 0400) Temp Source: Oral (10/14 0400) BP: 108/70 (10/14 0400) Pulse Rate: 77 (10/14 0400)  Labs: Recent Labs    01/10/2020 0835 01/11/2020 1236 2020-01-25 0419 01/25/20 0955 01/25/20 1011 01-25-20 1011 01/03/20 0031 01/03/20 1007 01/04/20 0300  HGB  --   --  13.7   < > 14.6   < > 13.5  --  12.9*  HCT  --   --  43.2   < > 43.0  --  42.6  --  41.8  PLT  --   --  220  --   --   --  220  --  220  LABPROT  --   --  14.1  --   --   --  13.4  --   --   INR  --   --  1.1  --   --   --  1.1  --   --   HEPARINUNFRC  --    < > 0.29*  --   --   --  0.28* 0.47  --   CREATININE  --    < > 1.17  --   --   --  1.09  --  0.96  TROPONINIHS 4,615*  --   --   --   --   --   --   --   --    < > = values in this interval not displayed.    Estimated Creatinine Clearance: 57.4 mL/min (by C-G formula based on SCr of 0.96 mg/dL).   Medical History: Past Medical History:  Diagnosis Date  . Diverticulosis   . Hyperlipidemia   . Hypertension   . Internal hemorrhoids   . PVD (peripheral vascular disease) (HCC)     Medications:  Medications Prior to Admission  Medication Sig Dispense Refill Last Dose  . amLODipine (NORVASC) 10 MG tablet Take 10 mg by mouth daily.   12/31/2019 at Unknown time  . aspirin 325 MG tablet Take 325 mg by mouth daily.   12/31/2019 at Unknown time  . atorvastatin (LIPITOR) 80 MG tablet Take 40 mg by mouth at bedtime.    12/30/2019 at Unknown time  . Cholecalciferol 50 MCG (2000 UT) TABS Take 4,000 Units by mouth daily.   12/31/2019 at Unknown time  . clopidogrel (PLAVIX) 75 MG tablet Take 75 mg by mouth at bedtime.     12/30/2019  . tadalafil (CIALIS) 10 MG tablet Take 5 mg by mouth daily as needed for erectile dysfunction.   > 1 month    Assessment: 21 yoM with no prior cardiac history but PMH HTN/HLD, PAD, tobacco & EtOH, admitted for acute SOB. Found to have NSTEMI with acutely reduced LVEF 20%. Pharmacy to dose IV heparin.  Heparin drip 1200 uts/hr, HL 0.3 this am CABG planned for next week.  Goal of Therapy: Heparin level 0.3-0.7 units/ml Monitor platelets by anticoagulation protocol: Yes  Plan: Heparin at 1200 units/hr Daily CBC/heparin level  Sheppard Coil PharmD., BCPS Clinical Pharmacist 01/04/2020 7:58 AM

## 2020-01-05 DIAGNOSIS — I5021 Acute systolic (congestive) heart failure: Secondary | ICD-10-CM | POA: Diagnosis not present

## 2020-01-05 DIAGNOSIS — J9601 Acute respiratory failure with hypoxia: Secondary | ICD-10-CM | POA: Diagnosis not present

## 2020-01-05 DIAGNOSIS — I25110 Atherosclerotic heart disease of native coronary artery with unstable angina pectoris: Secondary | ICD-10-CM | POA: Diagnosis not present

## 2020-01-05 DIAGNOSIS — I25750 Atherosclerosis of native coronary artery of transplanted heart with unstable angina: Secondary | ICD-10-CM | POA: Diagnosis not present

## 2020-01-05 LAB — CBC
HCT: 41.7 % (ref 39.0–52.0)
Hemoglobin: 12.9 g/dL — ABNORMAL LOW (ref 13.0–17.0)
MCH: 27.7 pg (ref 26.0–34.0)
MCHC: 30.9 g/dL (ref 30.0–36.0)
MCV: 89.5 fL (ref 80.0–100.0)
Platelets: 222 10*3/uL (ref 150–400)
RBC: 4.66 MIL/uL (ref 4.22–5.81)
RDW: 14.3 % (ref 11.5–15.5)
WBC: 8.7 10*3/uL (ref 4.0–10.5)
nRBC: 0 % (ref 0.0–0.2)

## 2020-01-05 LAB — COOXEMETRY PANEL
Carboxyhemoglobin: 1.3 % (ref 0.5–1.5)
Methemoglobin: 0.7 % (ref 0.0–1.5)
O2 Saturation: 60 %
Total hemoglobin: 13.3 g/dL (ref 12.0–16.0)

## 2020-01-05 LAB — BASIC METABOLIC PANEL
Anion gap: 11 (ref 5–15)
BUN: 16 mg/dL (ref 8–23)
CO2: 26 mmol/L (ref 22–32)
Calcium: 9 mg/dL (ref 8.9–10.3)
Chloride: 100 mmol/L (ref 98–111)
Creatinine, Ser: 1.06 mg/dL (ref 0.61–1.24)
GFR, Estimated: >60 mL/min (ref >60)
Glucose, Bld: 111 mg/dL — ABNORMAL HIGH (ref 70–99)
Potassium: 4 mmol/L (ref 3.5–5.1)
Sodium: 137 mmol/L (ref 135–145)

## 2020-01-05 LAB — HEPATIC FUNCTION PANEL
ALT: 27 U/L (ref 0–44)
AST: 22 U/L (ref 15–41)
Albumin: 2.9 g/dL — ABNORMAL LOW (ref 3.5–5.0)
Alkaline Phosphatase: 93 U/L (ref 38–126)
Bilirubin, Direct: <0.1 mg/dL (ref 0.0–0.2)
Total Bilirubin: 0.6 mg/dL (ref 0.3–1.2)
Total Protein: 5.7 g/dL — ABNORMAL LOW (ref 6.5–8.1)

## 2020-01-05 LAB — HEPARIN LEVEL (UNFRACTIONATED): Heparin Unfractionated: 0.43 IU/mL (ref 0.30–0.70)

## 2020-01-05 LAB — MAGNESIUM: Magnesium: 2 mg/dL (ref 1.7–2.4)

## 2020-01-05 LAB — CK: Total CK: 40 U/L — ABNORMAL LOW (ref 49–397)

## 2020-01-05 MED ORDER — SODIUM CHLORIDE 0.9 % IV SOLN
1.0000 g | INTRAVENOUS | Status: AC
  Administered 2020-01-05 – 2020-01-09 (×5): 1 g via INTRAVENOUS
  Filled 2020-01-05 (×2): qty 10
  Filled 2020-01-05 (×2): qty 1
  Filled 2020-01-05: qty 10

## 2020-01-05 NOTE — Progress Notes (Signed)
CARDIAC REHAB PHASE I   PRE:  Rate/Rhythm: 87 SR  MODE:  Ambulation: 800 ft   POST:  Rate/Rhythm: 97 SR    Preop education completed with pt. Pt given IS and able to demonstrate 1125. Pt educated on importance of IS use, walks, and sternal precautions. Pt given in-the-tube sheet along with cardiac surgery booklet. Pt states he has help at home for after d/c. Pt than ambulated 863ft in hallway with ease, denies any complaints. Encouraged IS use and ambulation with emphasis of not over-doing it. Will continue to follow throughout his hospital stay.   [id number] Reynold Bowen, RN BSN 01/05/2020 11:09 AM

## 2020-01-05 NOTE — Progress Notes (Signed)
Patient and pt's daughter called and reports that pt's left arm is swollen and painful (like aching). Left AC area looks like old IV infiltration site noted.   Left arm is red, hot to touch and painful whenever touch or moving. Pain is 7 out of 10. Oxycodone 10 mg given. Ice Pack applied.   Patient and his daughter thinks it is getting worse. MD Rathore paged. Will continue to assess.

## 2020-01-05 NOTE — Progress Notes (Addendum)
      [address]       [place], [address]             [phone]      3 Days Post-Op Procedure(s) (LRB): RIGHT/LEFT HEART CATH AND CORONARY ANGIOGRAPHY (N/A) Subjective: Feels good this morning. No chest pain. Answered questions about surgery.   Objective: Vital signs in last 24 hours: Temp:  [98.1 F (36.7 C)-98.5 F (36.9 C)] 98.5 F (36.9 C) (10/15 0339) Pulse Rate:  [68-100] 83 (10/15 0339) Cardiac Rhythm: Normal sinus rhythm (10/15 0339) Resp:  [16-22] 20 (10/15 0339) BP: (100-122)/(51-73) 112/62 (10/15 0339) Weight:  [56.9 kg] 56.9 kg (10/15 0601)  Hemodynamic parameters for last 24 hours: CVP:  [4 mmHg-5 mmHg] 4 mmHg  Intake/Output from previous day: 10/14 0701 - 10/15 0700 In: 620.6 [P.O.:240; I.V.:380.6] Out: 1700 [Urine:1700] Intake/Output this shift: No intake/output data recorded.  General appearance: alert, cooperative and no distress Heart: regular rate and rhythm, S1, S2 normal, no murmur, click, rub or gallop Lungs: clear to auscultation bilaterally Abdomen: soft, non-tender; bowel sounds normal; no masses,  no organomegaly Extremities: extremities normal, atraumatic, no cyanosis or edema Wound: n/a  Lab Results: Recent Labs    01/04/20 0300 01/05/20 0345  WBC 9.5 8.7  HGB 12.9* 12.9*  HCT 41.8 41.7  PLT 220 222   BMET:  Recent Labs    01/04/20 0300 01/05/20 0345  NA 140 137  K 3.7 4.0  CL 103 100  CO2 29 26  GLUCOSE 102* 111*  BUN 13 16  CREATININE 0.96 1.06  CALCIUM 8.8* 9.0    PT/INR:  Recent Labs    01/03/20 0031  LABPROT 13.4  INR 1.1   ABG    Component Value Date/Time   PHART 7.456 (H) 01/03/2020 0250   HCO3 27.9 01/03/2020 0250   TCO2 30 01/01/2020 1011   O2SAT 60.0 01/05/2020 0345   CBG (last 3)  No results for input(s): GLUCAP in the last 72 hours.  Assessment/Plan: S/P Procedure(s) (LRB): RIGHT/LEFT HEART CATH AND CORONARY ANGIOGRAPHY (N/A)  Plan: CABG with Impella 5.5 planned for  Monday after plavix washout. Patient understands the procedure and questions were answered. Left leg vein to be used. LVEF < 20% on milrinone and heparin.     LOS: 4 days    Sharlene Dory 01/05/2020  Patient stable and feels well on milrinone 0.25 Coox 60% Patient in sinus rhythm CT scan of chest shows minimal aortic calcification in the root or ascending aorta with at least moderate COPD from chronic smoking PFTs show mechanics and diffusion capacity adequate for sternotomy Plan high risk CABG with grafts to LAD OM1 and OM2 and direct Impella 5.5 LVAD placement on Monday, October 18.  I discussed the procedure benefits and risks with the patient and he understands that this is the best long-term therapy of his coronary disease and best treatment to improve survival and improve LV function and symptoms

## 2020-01-05 NOTE — Progress Notes (Signed)
ANTICOAGULATION CONSULT NOTE  Pharmacy Consult for IV heparin Indication: chest pain/ACS  Allergies  Allergen Reactions  . Lisinopril Swelling    Angioedema in 2020    Patient Measurements: Height: 5' 10.5" (179.1 cm) Weight: 56.9 kg (125 lb 7.1 oz) IBW/kg (Calculated) : 74.15 Heparin Dosing Weight: TBW  Vital Signs: Temp: 98.3 F (36.8 C) (10/15 0742) Temp Source: Oral (10/15 0742) BP: 109/66 (10/15 0742) Pulse Rate: 101 (10/15 0942)  Labs: Recent Labs    01/03/20 0031 01/03/20 0031 01/03/20 1007 01/04/20 0300 01/04/20 1425 01/05/20 0345  HGB 13.5   < >  --  12.9*  --  12.9*  HCT 42.6  --   --  41.8  --  41.7  PLT 220  --   --  220  --  222  LABPROT 13.4  --   --   --   --   --   INR 1.1  --   --   --   --   --   HEPARINUNFRC 0.28*   < > 0.47  --  0.35 0.43  CREATININE 1.09  --   --  0.96  --  1.06   < > = values in this interval not displayed.    Estimated Creatinine Clearance: 52.9 mL/min (by C-G formula based on SCr of 1.06 mg/dL).   Medical History: Past Medical History:  Diagnosis Date  . Diverticulosis   . Hyperlipidemia   . Hypertension   . Internal hemorrhoids   . PVD (peripheral vascular disease) (HCC)     Medications:  Medications Prior to Admission  Medication Sig Dispense Refill Last Dose  . amLODipine (NORVASC) 10 MG tablet Take 10 mg by mouth daily.   12/31/2019 at Unknown time  . aspirin 325 MG tablet Take 325 mg by mouth daily.   12/31/2019 at Unknown time  . atorvastatin (LIPITOR) 80 MG tablet Take 40 mg by mouth at bedtime.    12/30/2019 at Unknown time  . Cholecalciferol 50 MCG (2000 UT) TABS Take 4,000 Units by mouth daily.   12/31/2019 at Unknown time  . clopidogrel (PLAVIX) 75 MG tablet Take 75 mg by mouth at bedtime.    12/30/2019  . tadalafil (CIALIS) 10 MG tablet Take 5 mg by mouth daily as needed for erectile dysfunction.   > 1 month    Assessment: 2 yoM with no prior cardiac history but PMH HTN/HLD, PAD, tobacco & EtOH,  admitted for acute SOB. Found to have NSTEMI with acutely reduced LVEF 20%. Pharmacy to dose IV heparin.  Heparin drip 1200 uts/hr, HL 0.4 this am CABG planned for next week.  Goal of Therapy: Heparin level 0.3-0.7 units/ml Monitor platelets by anticoagulation protocol: Yes  Plan: Heparin at 1200 units/hr Daily CBC/heparin level  Sheppard Coil PharmD., BCPS Clinical Pharmacist 01/05/2020 10:13 AM

## 2020-01-05 NOTE — Progress Notes (Signed)
PROGRESS NOTE    Adrian Phillips  XQJ:[id number] DOB: [birth year] DOA: 01/19/2020 PCP: Bea Laura, MD    Brief Narrative:  Adrian Phillips is a 69 year old male with past medical history notable for essential hypertension, hyperlipidemia, peripheral vascular disease, tobacco use disorder, EtOH abuse who presented to the emergency department with worsening shortness of breath.  Patient also reports increased lower extremity edema over the past few weeks.  In the ED, chest x-ray notable for vascular congestion, EKG with sinus tachycardia, LVH.  SBP elevated 190s.  Troponin elevated 1187 with a BNP of 269.  WBC count 19.2.  Patient was started on IV Lasix, nitroglycerin drip, and heparin drip.  Cardiology was consulted.  TRH consulted for admission for acute CHF exacerbation with hypertensive urgency and NSTEMI.   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  Chest x-ray notable for basilar congestion with elevated BNP. TTE 01/09/2020 with LVEF less than 20%, LV severely decreased function with global hypokinesis, grade 2 diastolic dysfunction, LA mildly dilated, moderate MR, IVC dilated. (Hx angioedema with ACEi, also avoiding Entresto).  Cardiac MRI 01/03/2020 diffuse hypokinesis septum/apex with EF 26%, subendocardial scar mid distal anterior  wall, septum, apex but no transmural or full-thickness scar suggesting possibility of LVEF recovery with revascularization --Cardiology/CTS following, appreciate assistance --net negative 1L past 24h, net negative 4.7L since admission --Digoxin 0.125 mg p.o. daily --Spironolactone 25 mg p.o. daily --Losartan 12.5 mg p.o. daily --Lasix 8m PO daily --PICC line placed 10/13 for milrinone infusion for low cardiac index on LHC --pending CABG w/ Impella 01/17/2020  NSTEMI Multivessel coronary artery disease Patient presenting with elevated troponin 1187, 4615.  Underwent left heart catheterization on 12/26/2019 with severely calcified eccentric proximal LAD, moderate proximal circumflex 70%, 75% ostial first obtuse marginal, 99% OM 2 and chronically occluded RCA with left-to-right collaterals. --Cardiology/CTS following, appreciate assistance --Continue atorvastatin 80 mg p.o. daily --Holding Plavix for planned CABG 01/07/2020 w/ Impella  Nonsustained ventricular tachycardia --Started on amiodarone 200 mg p.o. BID --Monitor on telemetry  Hypertensive emergency: Resolved Presenting with SBP's 190s.  Initially started on nitroglycerin drip, which has now been titrated off. --BP 112/62, this morning --Started on losartan 12.5 g p.o. daily, spironolactone 25 mg p.o. daily, furosemide 288mPO daily  Peripheral vascular disease with lower right lower extremity bypass History of right femoropopliteal bypass 2005. --Holding Plavix as above  --Continue statin  Tobacco use disorder Alcohol abuse Discussed need for complete EtOH cessation --Thiamine, folic acid, multivitamin   DVT prophylaxis: Heparin drip Code Status: Full code Family Communication: Updated patient extensively at bedside  Disposition Plan:  Status is: Inpatient  Remains inpatient appropriate because:Ongoing diagnostic testing needed not appropriate for outpatient work up, Unsafe d/c plan, IV treatments appropriate due  to intensity of illness or inability to take PO and Inpatient level of care appropriate due to severity of illness   Dispo: The patient is from: Home  Anticipated d/c is to: Home              Anticipated d/c date is: > 3 days              Patient currently is not medically stable to d/c.  Pending CABG 01/15/2020   Consultants:   Cardiology  Cardiothoracic surgery  Procedures:  TTE 01/10/2020: IMPRESSIONS  1. Left ventricular ejection fraction, by estimation, is <20%. The left  ventricle has severely decreased function. The left ventricle demonstrates  global hypokinesis. The left ventricular internal cavity size was  moderately dilated. Left ventricular  diastolic parameters are consistent with Grade II diastolic dysfunction  (pseudonormalization). Elevated left ventricular end-diastolic pressure.  2. Right ventricular systolic function is normal. The right ventricular  size is normal. There is mildly elevated pulmonary artery systolic  pressure.  3. Left atrial size was mildly dilated.  4. The mitral valve is normal in structure. Moderate mitral valve  regurgitation.  5. The aortic valve is tricuspid. Aortic valve regurgitation is not  visualized. No aortic stenosis is present.  6. The inferior vena cava is dilated in size with <50% respiratory  variability, suggesting right atrial pressure of 15 mmHg.   Antimicrobials:   None   Subjective: Patient seen and examined bedside, resting comfortably.  Shortness of breath and lower extremity edema continues to be resolved.  No complaints this morning.  Patient's pending CABG planned for 01/18/2020.  No other specific complaints or concerns at this time.  Denies headache, no visual changes, no chest pain, palpitations, shortness of breath, no abdominal pain, no weakness, no fatigue, no paresthesias.  No acute events overnight per nursing staff.  Objective: Vitals:   01/05/20 0601 01/05/20 0736 01/05/20 0742  01/05/20 0942  BP:   109/66   Pulse:   93 (!) 101  Resp:      Temp:   98.3 F (36.8 C)   TempSrc:   Oral   SpO2:  98%    Weight: 56.9 kg     Height:        Intake/Output Summary (Last 24 hours) at 01/05/2020 1101 Last data filed at 01/05/2020 1000 Gross per 24 hour  Intake 909.51 ml  Output 2600 ml  Net -1690.49 ml   Filed Weights   01/06/2020 0500 01/04/20 0640 01/05/20 0601  Weight: 57.6 kg 55.9 kg 56.9 kg    Examination:  General exam: Appears calm and comfortable  Respiratory system: Clear to auscultation. Respiratory effort normal.  Oxygenating well on room air Cardiovascular system: S1 & S2 heard, RRR. No JVD, murmurs, rubs, gallops or clicks. No pedal edema. Gastrointestinal system: Abdomen is nondistended, soft and nontender. No organomegaly or masses felt. Normal bowel sounds heard. Central nervous system: Alert and oriented. No focal neurological deficits. Extremities: Symmetric 5 x 5 power. Skin: No rashes, lesions or ulcers Psychiatry: Judgement and insight appear normal. Mood & affect appropriate.     Data Reviewed: I have personally reviewed following labs and imaging studies  CBC: Recent Labs  Lab 01/20/2020 0151 01/04/2020 0151 01/05/2020 0552 01/21/2020 0552 12/22/2019 0419 01/20/2020 0955 01/15/2020 1001 01/19/2020 1011 01/03/20 0031 01/04/20 0300 01/05/20 0345  WBC 19.2*   < > 12.1*  --  15.8*  --   --   --  11.6* 9.5 8.7  NEUTROABS 15.5*  --  11.4*  --   --   --   --   --   --   --   --   HGB 16.0   < >  12.7*   < > 13.7   < > 15.6 14.6 13.5 12.9* 12.9*  HCT 51.1   < > 42.2   < > 43.2   < > 46.0 43.0 42.6 41.8 41.7  MCV 93.9   < > 93.4  --  89.6  --   --   --  89.3 89.7 89.5  PLT 273   < > 207  --  220  --   --   --  220 220 222   < > = values in this interval not displayed.   Basic Metabolic Panel: Recent Labs  Lab 01/06/2020 0552 01/03/2020 0552 12/25/2019 1236 01/17/2020 1236 12/29/2019 0419 01/03/2020 0955 01/18/2020 1001 01/09/2020 1011 01/03/20 0031  01/04/20 0300 01/05/20 0345  NA 140   < > 138   < > 141   < > 141 140 139 140 137  K 5.1   < > 4.2   < > 4.5   < > 4.0 3.7 3.9 3.7 4.0  CL 107   < > 101  --  103  --   --   --  103 103 100  CO2 23   < > 26  --  30  --   --   --  _0 GLUCOSE 136*   < > 157*  --  132*  --   --   --  104* 102* 111*  BUN 15   < > 18  --  27*  --   --   --  _1 CREATININE 0.90   < > 1.14  --  1.17  --   --   --  1.09 0.96 1.06  CALCIUM 8.4*   < > 9.2  --  8.5*  --   --   --  8.6* 8.8* 9.0  MG 2.2  --  2.2  --   --   --   --   --  2.0 1.9 2.0   < > = values in this interval not displayed.   GFR: Estimated Creatinine Clearance: 52.9 mL/min (by C-G formula based on SCr of 1.06 mg/dL). Liver Function Tests: Recent Labs  Lab 01/03/2020 0552 01/03/20 0031 01/05/20 0345  AST 72* 53* 22  ALT 37 35 27  ALKPHOS 99 83 93  BILITOT 0.5 0.4 0.6  PROT 6.6 5.6* 5.7*  ALBUMIN 3.6 3.0* 2.9*   No results for input(s): LIPASE, AMYLASE in the last 168 hours. No results for input(s): AMMONIA in the last 168 hours. Coagulation Profile: Recent Labs  Lab 01/05/2020 0419 01/03/20 0031  INR 1.1 1.1   Cardiac Enzymes: No results for input(s): CKTOTAL, CKMB, CKMBINDEX, TROPONINI in the last 168 hours. BNP (last 3 results) No results for input(s): PROBNP in the last 8760 hours. HbA1C: No results for input(s): HGBA1C in the last 72 hours. CBG: No results for input(s): GLUCAP in the last 168 hours. Lipid Profile: No results for input(s): CHOL, HDL, LDLCALC, TRIG, CHOLHDL, LDLDIRECT in the last 72 hours. Thyroid Function Tests: Recent Labs    01/03/20 0031  TSH 2.080   Anemia Panel: No results for input(s): VITAMINB12, FOLATE, FERRITIN, TIBC, IRON, RETICCTPCT in the last 72 hours. Sepsis Labs: No results for input(s): PROCALCITON, LATICACIDVEN in the last 168 hours.  Recent Results (from the past 240 hour(s))  Respiratory Panel by RT PCR (Flu A&B, Covid) - Nasopharyngeal Swab     Status: None    Collection Time: 01/18/2020  1:51 AM  Specimen: Nasopharyngeal Swab  Result Value Ref Range Status   SARS Coronavirus 2 by RT PCR NEGATIVE NEGATIVE Final    Comment: (NOTE) SARS-CoV-2 target nucleic acids are NOT DETECTED.  The SARS-CoV-2 RNA is generally detectable in upper respiratoy specimens during the acute phase of infection. The lowest concentration of SARS-CoV-2 viral copies this assay can detect is 131 copies/mL. A negative result does not preclude SARS-Cov-2 infection and should not be used as the sole basis for treatment or other patient management decisions. A negative result may occur with  improper specimen collection/handling, submission of specimen other than nasopharyngeal swab, presence of viral mutation(s) within the areas targeted by this assay, and inadequate number of viral copies (<131 copies/mL). A negative result must be combined with clinical observations, patient history, and epidemiological information. The expected result is Negative.  Fact Sheet for Patients:  PinkCheek.be  Fact Sheet for Healthcare Providers:  GravelBags.it  This test is no t yet approved or cleared by the [place] FDA and  has been authorized for detection and/or diagnosis of SARS-CoV-2 by FDA under an Emergency Use Authorization (EUA). This EUA will remain  in effect (meaning this test can be used) for the duration of the COVID-19 declaration under Section 564(b)(1) of the Act, 21 U.S.C. section 360bbb-3(b)(1), unless the authorization is terminated or revoked sooner.     Influenza A by PCR NEGATIVE NEGATIVE Final   Influenza B by PCR NEGATIVE NEGATIVE Final    Comment: (NOTE) The Xpert Xpress SARS-CoV-2/FLU/RSV assay is intended as an aid in  the diagnosis of influenza from Nasopharyngeal swab specimens and  should not be used as a sole basis for treatment. Nasal washings and  aspirates are unacceptable for Xpert  Xpress SARS-CoV-2/FLU/RSV  testing.  Fact Sheet for Patients: PinkCheek.be  Fact Sheet for Healthcare Providers: GravelBags.it  This test is not yet approved or cleared by the [place] FDA and  has been authorized for detection and/or diagnosis of SARS-CoV-2 by  FDA under an Emergency Use Authorization (EUA). This EUA will remain  in effect (meaning this test can be used) for the duration of the  Covid-19 declaration under Section 564(b)(1) of the Act, 21  U.S.C. section 360bbb-3(b)(1), unless the authorization is  terminated or revoked. Performed at [facility], [place] [address], [place], [place] [address]   MRSA PCR Screening     Status: None   Collection Time: 01/10/2020 10:11 AM   Specimen: Nasal Mucosa; Nasopharyngeal  Result Value Ref Range Status   MRSA by PCR NEGATIVE NEGATIVE Final    Comment:        The GeneXpert MRSA Assay (FDA approved for NASAL specimens only), is one component of a comprehensive MRSA colonization surveillance program. It is not intended to diagnose MRSA infection nor to guide or monitor treatment for MRSA infections. Performed at [facility], [place] [address] [place], [place] [address]          Radiology Studies: CT CHEST W CONTRAST  Result Date: 01/04/2020 CLINICAL DATA:  Pleural effusion, evaluate lung parenchyma preoperative assessment EXAM: CT CHEST WITH CONTRAST TECHNIQUE: Multidetector CT imaging of the chest was performed during intravenous contrast administration. CONTRAST:  33m OMNIPAQUE IOHEXOL 350 MG/ML SOLN COMPARISON:  Chest x-rays from October 2011. Cardiac MRI also available. FINDINGS: Cardiovascular: RIGHT-sided central venous access device, PICC line terminates in the caval to atrial junction. Calcified and noncalcified atheromatous plaque of the thoracic aorta. No aneurysmal dilation. Heart size enlarged with LEFT ventricular  predominance, see recent cardiac MRI. Calcified three-vessel coronary artery disease. Central pulmonary vasculature normal caliber. Mediastinum/Nodes: No axillary or thoracic inlet adenopathy. No mediastinal lymphadenopathy. Esophagus grossly normal. No hilar lymphadenopathy. Lungs/Pleura: Moderate to marked pulmonary emphysema worse at the lung apices with paraseptal and centrilobular changes. Basilar atelectasis or scarring bilaterally. No consolidation or sign of pleural effusion. Airways are patent Upper Abdomen: Imaged portions of liver and gallbladder are nor. The structures are incompletely imaged. Pancreas without signs of inflammation with respect imaged portions. Spleen is normal. Adrenal glands are normal. Question of mild antral thickening in the stomach, nonspecific on CT. No upper abdominal adenopathy with very limited assessment of upper abdominal structures. Musculoskeletal: No chest wall mass.  Spinal degenerative changes. IMPRESSION: 1. Moderate to marked pulmonary emphysema worse at the lung apices with paraseptal and centrilobular changes. 2. Question of mild antral thickening in the stomach, nonspecific on CT and not well evaluated, correlate with any abdominal symptoms that would suggest gastritis. 3. Calcified three-vessel coronary artery disease. 4. Emphysema and aortic atherosclerosis. Aortic Atherosclerosis (ICD10-I70.0) and Emphysema (ICD10-J43.9). Electronically Signed   By: Zetta Bills M.D.   On: 01/04/2020 12:50   MR CARDIAC MORPHOLOGY W WO CONTRAST  Result Date: 01/03/2020 CLINICAL DATA:  Ischemic Cardiomyopathy / Viability EXAM: CARDIAC MRI TECHNIQUE: The patient was scanned on a 1.5 Tesla Siemens magnet. A dedicated cardiac coil was used. Functional imaging was done using Fiesta sequences. 2,3, and 4 chamber views were done to assess for RWMA's. Modified Simpson's rule using a short axis stack was used to calculate an ejection fraction on a dedicated work Doctor, hospital. The patient received 8 cc of Gadavist. After 10 minutes inversion recovery sequences were used to assess for infiltration and scar tissue. CONTRAST:  Gadavist FINDINGS: Mild LAE. Normal RA/RV size. Normal RV function. No ASD/PFO Normal aortic root 2.8 cm. Mild appearing MR. AV tri leaflet with some turbulent flow suggesting sclerosis but not stenosis There is a small pericardial effusion. There is moderate LVE with diffuse hypokinesis worse in the septum and apex. Quantitative EF 25% (EDV 154 cc ESV 114 cc SV 40 cc) Delayed enhancement images show subendocardial scar in the mid/distal anterior wall, entire septum and apex. There is no mural apical thrombus IMPRESSION: 1. Moderate LVE with diffuse hypokinesis worse in the septum and apex EF 26% 2. Post gadolinium images with sub-endocardial scar in the mid distal anterior wall, septum and apex. No transmural or full thickness scar suggesting possibility Of LVEF recovery with revascularization 3.  Small pericardial effusion 4.  Mild appearing MR 5.  AV sclerosis 6.  Mild LAE Jenkins Rouge Electronically Signed   By: Jenkins Rouge M.D.   On: 01/03/2020 17:36   VAS [place] LOWER EXTREMITY SAPHENOUS VEIN MAPPING  Result Date: 01/04/2020 LOWER EXTREMITY VEIN MAPPING Indications:  Pre-CABG Risk Factors: Hypertension, hyperlipidemia, current smoker, coronary artery               disease, PVD.  Comparison Study: No prior studies. Performing Technologist: Darlin Coco  Examination Guidelines: A complete evaluation includes B-mode imaging, spectral Doppler, color Doppler, and power Doppler as needed of all accessible portions of each vessel. Bilateral testing is considered an integral part of a complete examination. Limited examinations for reoccurring indications may be performed as noted. +--------------+-----------+--------------------+--------------+---------------+   RT Diameter   RT Findings         GSV           LT Diameter     LT  Findings          (cm)                                             (cm)                       +--------------+-----------+--------------------+--------------+---------------+                                Saphenofemoral         0.61                                                         Junction                                       +--------------+-----------+--------------------+--------------+---------------+                                Proximal thigh         0.50          Partial                                                                         thrombus      +--------------+-----------+--------------------+--------------+---------------+                                  Mid thigh            0.28                       +--------------+-----------+--------------------+--------------+---------------+                                 Distal thigh          0.33                       +--------------+-----------+--------------------+--------------+---------------+                                     Knee              0.27                       +--------------+-----------+--------------------+--------------+---------------+                                  Prox calf  0.22         branching     +--------------+-----------+--------------------+--------------+---------------+                                   Mid calf            0.19          Partial                                                                       thrombus and                                                                       branching     +--------------+-----------+--------------------+--------------+---------------+                                 Distal calf           0.21                       +--------------+-----------+--------------------+--------------+---------------+ +----------------+-----------+---------------+----------------+----------------+  RT diameter (cm) RT Findings       SSV       LT Diameter (cm)   LT Findings      +----------------+-----------+---------------+----------------+----------------+                               Popliteal fossa       0.29                         +----------------+-----------+---------------+----------------+----------------+                                Proximal calf        0.19          branching      +----------------+-----------+---------------+----------------+----------------+                                  Mid calf           0.19       Partial thrombus                                                                  and branching    +----------------+-----------+---------------+----------------+----------------+  Distal calf         0.26                         +----------------+-----------+---------------+----------------+----------------+ Diagnosing physician: Servando Snare MD Electronically signed by Servando Snare MD on 01/04/2020 at 2:10:21 PM.    Final    VAS US DOPPLER PRE CABG  Result Date: 01/04/2020 PREOPERATIVE VASCULAR EVALUATION  Indications:      Pre-CABG. Risk Factors:     Hypertension, hyperlipidemia, current smoker, coronary artery                   disease, PVD. Comparison Study: No prior studies. Performing Technologist: Darlin Coco  Examination Guidelines: A complete evaluation includes B-mode imaging, spectral Doppler, color Doppler, and power Doppler as needed of all accessible portions of each vessel. Bilateral testing is considered an integral part of a complete examination. Limited examinations for reoccurring indications may be performed as noted.  Right Carotid Findings: +----------+--------+--------+--------+--------+------------------+             PSV cm/s EDV cm/s Stenosis Describe Comments            +----------+--------+--------+--------+--------+------------------+  CCA Prox   88       17                         intimal thickening  +----------+--------+--------+--------+--------+------------------+  CCA Distal 92       15                 calcific                     +----------+--------+--------+--------+--------+------------------+  ICA Prox   210      68       60-79%   calcific                     +----------+--------+--------+--------+--------+------------------+  ICA Mid    166      44                                             +----------+--------+--------+--------+--------+------------------+  ICA Distal 90       23                                             +----------+--------+--------+--------+--------+------------------+  ECA        94       16                                             +----------+--------+--------+--------+--------+------------------+ Portions of this table do not appear on this page. +----------+--------+-------+----------------+------------+             PSV cm/s EDV cms Describe         Arm Pressure  +----------+--------+-------+----------------+------------+  Subclavian 98               Multiphasic, WNL               +----------+--------+-------+----------------+------------+ +---------+--------+--+--------+--+---------+  Vertebral PSV cm/s 46 EDV cm/s 18 Antegrade  +---------+--------+--+--------+--+---------+ Left Carotid Findings: +----------+-------+--------+--------+-----------------------+-----------------+  PSV     EDV cm/s Stenosis Describe                Comments                       cm/s                                                                 +----------+-------+--------+--------+-----------------------+-----------------+  CCA Prox   113     17                                        intimal                                                                          thickening         +----------+-------+--------+--------+-----------------------+-----------------+  CCA Distal 80      18                                                           +----------+-------+--------+--------+-----------------------+-----------------+  ICA Prox   94      23                heterogenous  and                                                                 calcific                                   +----------+-------+--------+--------+-----------------------+-----------------+  ICA Distal 76      25                                                           +----------+-------+--------+--------+-----------------------+-----------------+  ECA        91      13                                                           +----------+-------+--------+--------+-----------------------+-----------------+ +----------+--------+--------+----------------+------------+  Subclavian PSV cm/s EDV cm/s Describe  Arm Pressure  +----------+--------+--------+----------------+------------+             188               Multiphasic, WNL               +----------+--------+--------+----------------+------------+ +---------+--------+---+--------+--+---------+  Vertebral PSV cm/s 112 EDV cm/s 21 Antegrade  +---------+--------+---+--------+--+---------+  ABI Findings: +--------+------------------+-----+----------+--------+  Right    Rt Pressure (mmHg) Index Waveform   Comment   +--------+------------------+-----+----------+--------+  Brachial                          triphasic            +--------+------------------+-----+----------+--------+  PTA                               biphasic             +--------+------------------+-----+----------+--------+  PERO                              biphasic             +--------+------------------+-----+----------+--------+  DP                                monophasic           +--------+------------------+-----+----------+--------+ +--------+------------------+-----+----------+-------+  Left     Lt Pressure (mmHg) Index Waveform   Comment  +--------+------------------+-----+----------+-------+  Brachial 118                      triphasic           +--------+------------------+-----+----------+-------+  PTA                               monophasic           +--------+------------------+-----+----------+-------+  PERO                              monophasic          +--------+------------------+-----+----------+-------+  Right Doppler Findings: +--------+--------+-----+---------+--------+  Site     Pressure Index Doppler   Comments  +--------+--------+-----+---------+--------+  Brachial                triphasic           +--------+--------+-----+---------+--------+  Radial                  triphasic           +--------+--------+-----+---------+--------+  Ulnar                   triphasic           +--------+--------+-----+---------+--------+  Left Doppler Findings: +--------+--------+-----+---------+--------+  Site     Pressure Index Doppler   Comments  +--------+--------+-----+---------+--------+  Brachial 118            triphasic           +--------+--------+-----+---------+--------+  Radial                  biphasic            +--------+--------+-----+---------+--------+  Ulnar  triphasic           +--------+--------+-----+---------+--------+  Summary: Right Carotid: Velocities in the right ICA are consistent with a 60-79%                stenosis. Left Carotid: Velocities in the left ICA are consistent with a 1-39% stenosis. Vertebrals:  Bilateral vertebral arteries demonstrate antegrade flow. Subclavians: Normal flow hemodynamics were seen in bilateral subclavian              arteries. Right Upper Extremity: Doppler waveforms remain within normal limits with right radial compression. Doppler waveforms remain within normal limits with right ulnar compression. Left Upper Extremity: Doppler waveforms remain within normal limits with left radial compression. Doppler waveforms remain within normal limits with left ulnar compression.  Electronically signed by Servando Snare MD on 01/04/2020 at 2:10:05 PM.    Final         Scheduled Meds:  amiodarone  200 mg Oral BID   aspirin EC  81 mg Oral Daily   atorvastatin  80 mg Oral Daily    chlorhexidine gluconate (MEDLINE KIT)  15 mL Mouth Rinse BID   Chlorhexidine Gluconate Cloth  6 each Topical Daily   digoxin  0.125 mg Oral Daily   feeding supplement  237 mL Oral TID WC   folic acid  1 mg Oral Daily   furosemide  20 mg Oral Daily   influenza vaccine adjuvanted  0.5 mL Intramuscular Tomorrow-1000   losartan  12.5 mg Oral Daily   mouth rinse  15 mL Mouth Rinse q12n4p   multivitamin with minerals  1 tablet Oral Daily   sodium chloride flush  10-40 mL Intracatheter Q12H   sodium chloride flush  3 mL Intravenous Q12H   sodium chloride flush  3 mL Intravenous Q12H   spironolactone  25 mg Oral Daily   thiamine  100 mg Oral Daily   Or   thiamine  100 mg Intravenous Daily   Continuous Infusions:  sodium chloride 250 mL (01/07/2020 1049)   sodium chloride     heparin 1,200 Units/hr (01/05/20 1000)   milrinone 0.25 mcg/kg/min (01/05/20 1000)     LOS: 4 days    Time spent: 32 minutes spent on chart review, discussion with nursing staff, consultants, updating family and interview/physical exam; more than 50% of that time was spent in counseling and/or coordination of care.    Jameyah Fennewald J [place] DO [facility] Available via Epic secure chat 7am-7pm After these hours, please refer to coverage provider listed on [email or web address] 01/05/2020, 11:01 AM

## 2020-01-05 NOTE — Progress Notes (Signed)
Floor coverage overnight event  Notified by nursing staff that patient's IV infiltrated earlier today and he has pain and swelling at the site -left antecubital fossa.  Patient was seen and examined at bedside.  Sleeping comfortably when I entered the room and no signs of distress.  After waking up, he tells me he is having pain and swelling in this area.  He received oxycodone 10 mg earlier which has helped.  Denies any numbness or tingling.  Noted to have mild swelling, erythema, and warmth to touch at the left antecubital fossa.  No signs of neurovascular compromise.  No pallor.  No pain with passive stretching of muscles.  Compartments soft to touch.  No signs of tissue necrosis or skin ulceration.  -Elevate affected extremity -Continue pain control -Apply cold packs to the affected area -Check CK level -Start ceftriaxone due to concern for possible early signs of cellulitis -Monitor very closely

## 2020-01-05 NOTE — Progress Notes (Signed)
Patient ID: Adrian Phillips, male   DOB: June 22, 1950, 69 y.o.   MRN: [id number]     Advanced Heart Failure Rounding Note  PCP-Cardiologist: Jenkins Rouge, MD   Subjective:    No complaints today, no dyspnea or chest pain.  Remains on milrinone 0.25, co-ox 60%.  CVP 6.   Cardiac MRI:  1. Moderate LVE with diffuse hypokinesis worse in the septum and apex EF 26% 2. Post gadolinium images with sub-endocardial scar in the mid distal anterior wall, septum and apex. No transmural or full thickness scar suggesting possibility of LVEF recovery with revascularization.    Objective:   Weight Range: 56.9 kg Body mass index is 17.74 kg/m.   Vital Signs:   Temp:  [98.1 F (36.7 C)-98.5 F (36.9 C)] 98.3 F (36.8 C) (10/15 0742) Pulse Rate:  [76-101] 101 (10/15 0942) Resp:  [16-22] 20 (10/15 0339) BP: (100-122)/(58-73) 109/66 (10/15 0742) SpO2:  [98 %] 98 % (10/15 0736) Weight:  [56.9 kg] 56.9 kg (10/15 0601) Last BM Date: 01/04/20  Weight change: Filed Weights   12/30/2019 0500 01/04/20 0640 01/05/20 0601  Weight: 57.6 kg 55.9 kg 56.9 kg    Intake/Output:   Intake/Output Summary (Last 24 hours) at 01/05/2020 0944 Last data filed at 01/05/2020 0744 Gross per 24 hour  Intake 860.61 ml  Output 2600 ml  Net -1739.39 ml      Physical Exam    General: NAD Neck: No JVD, no thyromegaly or thyroid nodule.  Lungs: Clear to auscultation bilaterally with normal respiratory effort. CV: Nondisplaced PMI.  Heart regular S1/S2, no S3/S4, no murmur.  No peripheral edema.   Abdomen: Soft, nontender, no hepatosplenomegaly, no distention.  Skin: Intact without lesions or rashes.  Neurologic: Alert and oriented x 3.  Psych: Normal affect. Extremities: No clubbing or cyanosis.  HEENT: Normal.    Telemetry   NSR 70s-80s, personally reviewed   EKG    n/a  Labs    CBC Recent Labs    01/04/20 0300 01/05/20 0345  WBC 9.5 8.7  HGB 12.9* 12.9*  HCT 41.8 41.7  MCV 89.7 89.5    PLT 220 791   Basic Metabolic Panel Recent Labs    01/04/20 0300 01/05/20 0345  NA 140 137  K 3.7 4.0  CL 103 100  CO2 29 26  GLUCOSE 102* 111*  BUN 13 16  CREATININE 0.96 1.06  CALCIUM 8.8* 9.0  MG 1.9 2.0   Liver Function Tests Recent Labs    01/03/20 0031 01/05/20 0345  AST 53* 22  ALT 35 27  ALKPHOS 83 93  BILITOT 0.4 0.6  PROT 5.6* 5.7*  ALBUMIN 3.0* 2.9*   No results for input(s): LIPASE, AMYLASE in the last 72 hours. Cardiac Enzymes No results for input(s): CKTOTAL, CKMB, CKMBINDEX, TROPONINI in the last 72 hours.  BNP: BNP (last 3 results) Recent Labs    01/16/2020 0151  BNP 269.4*    ProBNP (last 3 results) No results for input(s): PROBNP in the last 8760 hours.   D-Dimer No results for input(s): DDIMER in the last 72 hours. Hemoglobin A1C No results for input(s): HGBA1C in the last 72 hours. Fasting Lipid Panel No results for input(s): CHOL, HDL, LDLCALC, TRIG, CHOLHDL, LDLDIRECT in the last 72 hours. Thyroid Function Tests Recent Labs    01/03/20 0031  TSH 2.080    Other results:   Imaging    CT CHEST W CONTRAST  Result Date: 01/04/2020 CLINICAL DATA:  Pleural effusion, evaluate lung parenchyma  preoperative assessment EXAM: CT CHEST WITH CONTRAST TECHNIQUE: Multidetector CT imaging of the chest was performed during intravenous contrast administration. CONTRAST:  37m OMNIPAQUE IOHEXOL 350 MG/ML SOLN COMPARISON:  Chest x-rays from October 2011. Cardiac MRI also available. FINDINGS: Cardiovascular: RIGHT-sided central venous access device, PICC line terminates in the caval to atrial junction. Calcified and noncalcified atheromatous plaque of the thoracic aorta. No aneurysmal dilation. Heart size enlarged with LEFT ventricular predominance, see recent cardiac MRI. Calcified three-vessel coronary artery disease. Central pulmonary vasculature normal caliber. Mediastinum/Nodes: No axillary or thoracic inlet adenopathy. No mediastinal  lymphadenopathy. Esophagus grossly normal. No hilar lymphadenopathy. Lungs/Pleura: Moderate to marked pulmonary emphysema worse at the lung apices with paraseptal and centrilobular changes. Basilar atelectasis or scarring bilaterally. No consolidation or sign of pleural effusion. Airways are patent Upper Abdomen: Imaged portions of liver and gallbladder are nor. The structures are incompletely imaged. Pancreas without signs of inflammation with respect imaged portions. Spleen is normal. Adrenal glands are normal. Question of mild antral thickening in the stomach, nonspecific on CT. No upper abdominal adenopathy with very limited assessment of upper abdominal structures. Musculoskeletal: No chest wall mass.  Spinal degenerative changes. IMPRESSION: 1. Moderate to marked pulmonary emphysema worse at the lung apices with paraseptal and centrilobular changes. 2. Question of mild antral thickening in the stomach, nonspecific on CT and not well evaluated, correlate with any abdominal symptoms that would suggest gastritis. 3. Calcified three-vessel coronary artery disease. 4. Emphysema and aortic atherosclerosis. Aortic Atherosclerosis (ICD10-I70.0) and Emphysema (ICD10-J43.9). Electronically Signed   By: GZetta BillsM.D.   On: 01/04/2020 12:50   VAS [place] EXTREMITY SAPHENOUS VEIN MAPPING  Result Date: 01/04/2020 LOWER EXTREMITY VEIN MAPPING Indications:  Pre-CABG Risk Factors: Hypertension, hyperlipidemia, current smoker, coronary artery               disease, PVD.  Comparison Study: No prior studies. Performing Technologist: RDarlin Coco Examination Guidelines: A complete evaluation includes B-mode imaging, spectral Doppler, color Doppler, and power Doppler as needed of all accessible portions of each vessel. Bilateral testing is considered an integral part of a complete examination. Limited examinations for reoccurring indications may be performed as noted.  +--------------+-----------+--------------------+--------------+---------------+  RT Diameter  RT Findings        GSV          LT Diameter    LT Findings        (cm)                                         (cm)                     +--------------+-----------+--------------------+--------------+---------------+                             Saphenofemoral        0.61                                                    Junction                                    +--------------+-----------+--------------------+--------------+---------------+  Proximal thigh        0.50         Partial                                                                   thrombus     +--------------+-----------+--------------------+--------------+---------------+                               Mid thigh           0.28                     +--------------+-----------+--------------------+--------------+---------------+                              Distal thigh         0.33                     +--------------+-----------+--------------------+--------------+---------------+                                  Knee             0.27                     +--------------+-----------+--------------------+--------------+---------------+                               Prox calf           0.22        branching    +--------------+-----------+--------------------+--------------+---------------+                                Mid calf           0.19         Partial                                                                 thrombus and                                                                 branching    +--------------+-----------+--------------------+--------------+---------------+                              Distal calf          0.21                     +--------------+-----------+--------------------+--------------+---------------+  +----------------+-----------+---------------+----------------+----------------+ RT diameter (cm)RT Findings  SSV      LT Diameter (cm)  LT Findings    +----------------+-----------+---------------+----------------+----------------+                            Popliteal fossa      0.29                       +----------------+-----------+---------------+----------------+----------------+                             Proximal calf       0.19         branching     +----------------+-----------+---------------+----------------+----------------+                               Mid calf          0.19      Partial thrombus                                                            and branching   +----------------+-----------+---------------+----------------+----------------+                              Distal calf        0.26                       +----------------+-----------+---------------+----------------+----------------+ Diagnosing physician: Servando Snare MD Electronically signed by Servando Snare MD on 01/04/2020 at 2:10:21 PM.    Final    VAS US DOPPLER PRE CABG  Result Date: 01/04/2020 PREOPERATIVE VASCULAR EVALUATION  Indications:      Pre-CABG. Risk Factors:     Hypertension, hyperlipidemia, current smoker, coronary artery                   disease, PVD. Comparison Study: No prior studies. Performing Technologist: Darlin Coco  Examination Guidelines: A complete evaluation includes B-mode imaging, spectral Doppler, color Doppler, and power Doppler as needed of all accessible portions of each vessel. Bilateral testing is considered an integral part of a complete examination. Limited examinations for reoccurring indications may be performed as noted.  Right Carotid Findings: +----------+--------+--------+--------+--------+------------------+           PSV cm/sEDV cm/sStenosisDescribeComments            +----------+--------+--------+--------+--------+------------------+ CCA Prox  88      17                      intimal thickening +----------+--------+--------+--------+--------+------------------+ CCA Distal92      15              calcific                   +----------+--------+--------+--------+--------+------------------+ ICA Prox  210     68      60-79%  calcific                   +----------+--------+--------+--------+--------+------------------+ ICA Mid   166     44                                         +----------+--------+--------+--------+--------+------------------+  ICA Distal90      23                                         +----------+--------+--------+--------+--------+------------------+ ECA       94      16                                         +----------+--------+--------+--------+--------+------------------+ Portions of this table do not appear on this page. +----------+--------+-------+----------------+------------+           PSV cm/sEDV cmsDescribe        Arm Pressure +----------+--------+-------+----------------+------------+ Subclavian98             Multiphasic, WNL             +----------+--------+-------+----------------+------------+ +---------+--------+--+--------+--+---------+ VertebralPSV cm/s46EDV cm/s18Antegrade +---------+--------+--+--------+--+---------+ Left Carotid Findings: +----------+-------+--------+--------+-----------------------+-----------------+           PSV    EDV cm/sStenosisDescribe               Comments                    cm/s                                                            +----------+-------+--------+--------+-----------------------+-----------------+ CCA Prox  113    17                                     intimal                                                                   thickening         +----------+-------+--------+--------+-----------------------+-----------------+ CCA Distal80     18                                                       +----------+-------+--------+--------+-----------------------+-----------------+ ICA Prox  94     23              heterogenous and                                                          calcific                                 +----------+-------+--------+--------+-----------------------+-----------------+ ICA Distal76     25                                                       +----------+-------+--------+--------+-----------------------+-----------------+  ECA       91     13                                                       +----------+-------+--------+--------+-----------------------+-----------------+ +----------+--------+--------+----------------+------------+ SubclavianPSV cm/sEDV cm/sDescribe        Arm Pressure +----------+--------+--------+----------------+------------+           188             Multiphasic, WNL             +----------+--------+--------+----------------+------------+ +---------+--------+---+--------+--+---------+ VertebralPSV cm/s112EDV cm/s21Antegrade +---------+--------+---+--------+--+---------+  ABI Findings: +--------+------------------+-----+----------+--------+ Right   Rt Pressure (mmHg)IndexWaveform  Comment  +--------+------------------+-----+----------+--------+ Brachial                       triphasic          +--------+------------------+-----+----------+--------+ PTA                            biphasic           +--------+------------------+-----+----------+--------+ PERO                           biphasic           +--------+------------------+-----+----------+--------+ DP                             monophasic         +--------+------------------+-----+----------+--------+ +--------+------------------+-----+----------+-------+ Left     Lt Pressure (mmHg)IndexWaveform  Comment +--------+------------------+-----+----------+-------+ [id number]                    triphasic         +--------+------------------+-----+----------+-------+ PTA                            monophasic        +--------+------------------+-----+----------+-------+ PERO                           monophasic        +--------+------------------+-----+----------+-------+  Right Doppler Findings: +--------+--------+-----+---------+--------+ Site    PressureIndexDoppler  Comments +--------+--------+-----+---------+--------+ Brachial             triphasic         +--------+--------+-----+---------+--------+ Radial               triphasic         +--------+--------+-----+---------+--------+ Ulnar                triphasic         +--------+--------+-----+---------+--------+  Left Doppler Findings: +--------+--------+-----+---------+--------+ Site    PressureIndexDoppler  Comments +--------+--------+-----+---------+--------+ [id number]          triphasic         +--------+--------+-----+---------+--------+ Radial               biphasic          +--------+--------+-----+---------+--------+ Ulnar                triphasic         +--------+--------+-----+---------+--------+  Summary: Right Carotid: Velocities in the right ICA are consistent with a 60-79%  stenosis. Left Carotid: Velocities in the left ICA are consistent with a 1-39% stenosis. Vertebrals:  Bilateral vertebral arteries demonstrate antegrade flow. Subclavians: Normal flow hemodynamics were seen in bilateral subclavian              arteries. Right Upper Extremity: Doppler waveforms remain within normal limits with right radial compression. Doppler waveforms remain within normal limits with right ulnar compression. Left Upper Extremity: Doppler waveforms remain within normal limits with left radial compression. Doppler waveforms remain within  normal limits with left ulnar compression.  Electronically signed by Servando Snare MD on 01/04/2020 at 2:10:05 PM.    Final      Medications:     Scheduled Medications: . amiodarone  200 mg Oral BID  . aspirin EC  81 mg Oral Daily  . atorvastatin  80 mg Oral Daily  . chlorhexidine gluconate (MEDLINE KIT)  15 mL Mouth Rinse BID  . Chlorhexidine Gluconate Cloth  6 each Topical Daily  . digoxin  0.125 mg Oral Daily  . feeding supplement  237 mL Oral TID WC  . folic acid  1 mg Oral Daily  . furosemide  20 mg Oral Daily  . influenza vaccine adjuvanted  0.5 mL Intramuscular Tomorrow-1000  . losartan  12.5 mg Oral Daily  . mouth rinse  15 mL Mouth Rinse q12n4p  . multivitamin with minerals  1 tablet Oral Daily  . sodium chloride flush  10-40 mL Intracatheter Q12H  . sodium chloride flush  3 mL Intravenous Q12H  . sodium chloride flush  3 mL Intravenous Q12H  . spironolactone  25 mg Oral Daily  . thiamine  100 mg Oral Daily   Or  . thiamine  100 mg Intravenous Daily    Infusions: . sodium chloride 250 mL (01/17/2020 1049)  . sodium chloride    . heparin 1,200 Units/hr (01/05/20 0733)  . milrinone 0.25 mcg/kg/min (01/05/20 0122)    PRN Medications: sodium chloride, sodium chloride, acetaminophen, ondansetron (ZOFRAN) IV, oxyCODONE, sodium chloride flush, sodium chloride flush     Assessment/Plan   1. CAD: NSTEMI with cath showing totally occluded RCA with collaterals from LAD, 95% proximal LAD, 80% OM1 and 80% OM2.  Suspect he would be best-served by CABG.  Cardiac MRI showed significant viability, would expect to see improvement in LV function with revascularization.  - Continue ASA 81 daily.  - Continue atorvastatin 80 mg daily.  - Continue heparin gtt.  - CABG on Monday.   2. Acute systolic CHF: Ischemic cardiomyopathy.  Echo this admission with EF < 20%, normal RV.  RHC showed CI mildly decreased at 1.93 with mildly elevated PCWP and normal RA pressure. He received 1 dose of  Entresto and BP dropped significantly, now recovered. Now with PICC in place and on milrinone 0.25, co-ox 60% today with CVP 6. Pre-op cardiac MRI shows significant viability, would expect to see improved LV function with revascularization.  - Continue milrinone 0.25 pre-op.  - Continue Lasix 20 mg po daily.  - Continue digoxin 0.125 daily, check level in am.  - Continue spironolactone 25 mg daily.  - Would not retry Entresto given history of angioedema with ACEI.  Continue losartan 12.5 mg daily.  - Will need Impella 5.5 peri-operatively to support CABG with low EF.  3. NSVT: Noted runs this admission.  No ectopy noted now that he is on amiodarone.  - With CABG in future, started amiodarone 200 mg bid.  4. Active smoker: Needs to quit, counseled.  5. PAD:  h/o right fem-pop bypass.  6. Carotid stenosis: Asymptomatic 60-79% RICA stenosis.   Loralie Champagne 01/05/2020 9:44 AM

## 2020-01-05 NOTE — Progress Notes (Signed)
With minimal swelling of the left AC, post iv site. Ice pack applied. Claimed 'Feeling better". Continue to monitor.

## 2020-01-05 NOTE — Progress Notes (Signed)
Went for a walk outside the unit with SWOT Nurse for few min. and back without any complaint.

## 2020-01-06 DIAGNOSIS — I25750 Atherosclerosis of native coronary artery of transplanted heart with unstable angina: Secondary | ICD-10-CM | POA: Diagnosis not present

## 2020-01-06 DIAGNOSIS — I2510 Atherosclerotic heart disease of native coronary artery without angina pectoris: Secondary | ICD-10-CM

## 2020-01-06 DIAGNOSIS — J9601 Acute respiratory failure with hypoxia: Secondary | ICD-10-CM | POA: Diagnosis not present

## 2020-01-06 DIAGNOSIS — I25110 Atherosclerotic heart disease of native coronary artery with unstable angina pectoris: Secondary | ICD-10-CM | POA: Diagnosis not present

## 2020-01-06 DIAGNOSIS — I5021 Acute systolic (congestive) heart failure: Secondary | ICD-10-CM | POA: Diagnosis not present

## 2020-01-06 LAB — CBC
HCT: 43 % (ref 39.0–52.0)
Hemoglobin: 13.2 g/dL (ref 13.0–17.0)
MCH: 27.6 pg (ref 26.0–34.0)
MCHC: 30.7 g/dL (ref 30.0–36.0)
MCV: 90 fL (ref 80.0–100.0)
Platelets: 225 10*3/uL (ref 150–400)
RBC: 4.78 MIL/uL (ref 4.22–5.81)
RDW: 14.3 % (ref 11.5–15.5)
WBC: 9.7 10*3/uL (ref 4.0–10.5)
nRBC: 0 % (ref 0.0–0.2)

## 2020-01-06 LAB — BASIC METABOLIC PANEL
Anion gap: 9 (ref 5–15)
BUN: 16 mg/dL (ref 8–23)
CO2: 27 mmol/L (ref 22–32)
Calcium: 9.1 mg/dL (ref 8.9–10.3)
Chloride: 101 mmol/L (ref 98–111)
Creatinine, Ser: 1.06 mg/dL (ref 0.61–1.24)
GFR, Estimated: >60 mL/min (ref >60)
Glucose, Bld: 116 mg/dL — ABNORMAL HIGH (ref 70–99)
Potassium: 4.4 mmol/L (ref 3.5–5.1)
Sodium: 137 mmol/L (ref 135–145)

## 2020-01-06 LAB — COOXEMETRY PANEL
Carboxyhemoglobin: 1.1 % (ref 0.5–1.5)
Methemoglobin: 0.7 % (ref 0.0–1.5)
O2 Saturation: 67.3 %
Total hemoglobin: 13.6 g/dL (ref 12.0–16.0)

## 2020-01-06 LAB — HEPARIN LEVEL (UNFRACTIONATED): Heparin Unfractionated: 0.61 IU/mL (ref 0.30–0.70)

## 2020-01-06 LAB — DIGOXIN LEVEL: Digoxin Level: 0.5 ng/mL — ABNORMAL LOW (ref 1.0–2.0)

## 2020-01-06 MED ORDER — SODIUM CHLORIDE 0.9 % IV SOLN
1.5000 g | INTRAVENOUS | Status: AC
  Administered 2020-01-08: 1.5 g via INTRAVENOUS
  Filled 2020-01-06: qty 1.5

## 2020-01-06 MED ORDER — DEXMEDETOMIDINE HCL IN NACL 400 MCG/100ML IV SOLN
0.1000 ug/kg/h | INTRAVENOUS | Status: AC
  Administered 2020-01-08: .5 ug/kg/h via INTRAVENOUS
  Filled 2020-01-06: qty 100

## 2020-01-06 MED ORDER — MAGNESIUM SULFATE 50 % IJ SOLN
40.0000 meq | INTRAMUSCULAR | Status: DC
  Filled 2020-01-06: qty 9.85

## 2020-01-06 MED ORDER — PLASMA-LYTE 148 IV SOLN
INTRAVENOUS | Status: DC
  Filled 2020-01-06: qty 2.5

## 2020-01-06 MED ORDER — TRANEXAMIC ACID (OHS) PUMP PRIME SOLUTION
2.0000 mg/kg | INTRAVENOUS | Status: DC
  Filled 2020-01-06: qty 1.14

## 2020-01-06 MED ORDER — TRANEXAMIC ACID (OHS) BOLUS VIA INFUSION
15.0000 mg/kg | INTRAVENOUS | Status: AC
  Administered 2020-01-08: 853.5 mg via INTRAVENOUS
  Filled 2020-01-06: qty 854

## 2020-01-06 MED ORDER — PHENYLEPHRINE HCL-NACL 20-0.9 MG/250ML-% IV SOLN
30.0000 ug/min | INTRAVENOUS | Status: AC
  Administered 2020-01-08: 80 ug/min via INTRAVENOUS
  Administered 2020-01-08: 60 ug/min via INTRAVENOUS
  Filled 2020-01-06: qty 250

## 2020-01-06 MED ORDER — NITROGLYCERIN IN D5W 200-5 MCG/ML-% IV SOLN
2.0000 ug/min | INTRAVENOUS | Status: DC
  Filled 2020-01-06: qty 250

## 2020-01-06 MED ORDER — VANCOMYCIN HCL 1250 MG/250ML IV SOLN
1250.0000 mg | INTRAVENOUS | Status: AC
  Administered 2020-01-08: 1250 mg via INTRAVENOUS
  Filled 2020-01-06: qty 250

## 2020-01-06 MED ORDER — EPINEPHRINE HCL 5 MG/250ML IV SOLN IN NS
0.0000 ug/min | INTRAVENOUS | Status: DC
  Filled 2020-01-06: qty 250

## 2020-01-06 MED ORDER — TRANEXAMIC ACID 1000 MG/10ML IV SOLN
1.5000 mg/kg/h | INTRAVENOUS | Status: AC
  Administered 2020-01-08: 1.5 mg/kg/h via INTRAVENOUS
  Filled 2020-01-06: qty 25

## 2020-01-06 MED ORDER — POTASSIUM CHLORIDE 2 MEQ/ML IV SOLN
80.0000 meq | INTRAVENOUS | Status: DC
  Filled 2020-01-06: qty 40

## 2020-01-06 MED ORDER — SODIUM CHLORIDE 0.9 % IV SOLN
INTRAVENOUS | Status: DC
  Filled 2020-01-06: qty 30

## 2020-01-06 MED ORDER — NOREPINEPHRINE 4 MG/250ML-% IV SOLN
0.0000 ug/min | INTRAVENOUS | Status: AC
  Administered 2020-01-08: 2 ug/min via INTRAVENOUS
  Filled 2020-01-06: qty 250

## 2020-01-06 MED ORDER — MILRINONE LACTATE IN DEXTROSE 20-5 MG/100ML-% IV SOLN
0.3000 ug/kg/min | INTRAVENOUS | Status: DC
  Filled 2020-01-06: qty 100

## 2020-01-06 MED ORDER — SODIUM CHLORIDE 0.9 % IV SOLN
750.0000 mg | INTRAVENOUS | Status: AC
  Administered 2020-01-08: 750 mg via INTRAVENOUS
  Filled 2020-01-06: qty 750

## 2020-01-06 MED ORDER — INSULIN REGULAR(HUMAN) IN NACL 100-0.9 UT/100ML-% IV SOLN
INTRAVENOUS | Status: AC
  Administered 2020-01-08: .5 [IU]/h via INTRAVENOUS
  Filled 2020-01-06: qty 100

## 2020-01-06 NOTE — Progress Notes (Signed)
PROGRESS NOTE    HALTON NEAS  HCW:[id number] DOB: 12/12/1950 DOA: 12/30/2019 PCP: Bea Laura, MD    Brief Narrative:  Adrian Phillips is a 69 year old male with past medical history notable for essential hypertension, hyperlipidemia, peripheral vascular disease, tobacco use disorder, EtOH abuse who presented to the emergency department with worsening shortness of breath.  Patient also reports increased lower extremity edema over the past few weeks.  In the ED, chest x-ray notable for vascular congestion, EKG with sinus tachycardia, LVH.  SBP elevated 190s.  Troponin elevated 1187 with a BNP of 269.  WBC count 19.2.  Patient was started on IV Lasix, nitroglycerin drip, and heparin drip.  Cardiology was consulted.  TRH consulted for admission for acute CHF exacerbation with hypertensive urgency and NSTEMI.   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  Chest x-ray notable for basilar congestion with elevated BNP. TTE 12/23/2019 with LVEF less than 20%, LV severely decreased function with global hypokinesis, grade 2 diastolic dysfunction, LA mildly dilated, moderate MR, IVC dilated. (Hx angioedema with ACEi, also avoiding Entresto).  Cardiac MRI 01/03/2020 diffuse hypokinesis septum/apex with EF 26%, subendocardial scar mid distal anterior  wall, septum, apex but no transmural or full-thickness scar suggesting possibility of LVEF recovery with revascularization --Cardiology/CTS following, appreciate assistance --net positive 73m with 4 unmeasured urinary occurances past 24h, net negative 4.0L since admission --Digoxin 0.125 mg p.o. daily --Spironolactone 25 mg p.o. daily --Losartan 12.5 mg p.o. daily --Lasix 267mPO daily --PICC line placed 10/13 for milrinone infusion for low cardiac index on LHC --pending CABG w/ Impella 01/04/2020  NSTEMI Multivessel coronary artery disease Patient presenting with elevated troponin 1187, 4615.  Underwent left heart catheterization on 12/24/2019 with severely calcified eccentric proximal LAD, moderate proximal circumflex 70%, 75% ostial first obtuse marginal, 99% OM 2 and chronically occluded RCA with left-to-right collaterals. --Cardiology/CTS following, appreciate assistance --Continue atorvastatin 80 mg p.o. daily --Holding Plavix for planned CABG 12/23/2019 w/ Impella  Nonsustained ventricular tachycardia --Started on amiodarone 200 mg p.o. BID --Monitor on telemetry  Left Upper Extremity cellulitis vs phlebitis Overnight 10/15, patient left AC IV infiltrated causing pain and eythema. CK level low at 40.  --Ceftriaxone 1g IV q24h --warm compresses --closely monitor area and patients symptoms, if worsen; will consider soft tissue vs vas dulpex US[place]apper extremity.  Hypertensive emergency: Resolved Presenting with SBP's 190s.  Initially started on nitroglycerin drip, which has now been titrated off. --BP 132/70, this morning --Started on losartan 12.5 g p.o. daily, spironolactone 25 mg p.o. daily, furosemide 2054mO daily  Peripheral vascular disease with lower right lower extremity bypass History of right femoropopliteal bypass 2005. --Holding Plavix as above  --Continue statin  Tobacco use disorder Alcohol abuse Discussed need for complete EtOH cessation --Thiamine, folic  acid, multivitamin   DVT prophylaxis: Heparin drip Code Status: Full code Family Communication: Updated patient extensively at bedside  Disposition Plan:  Status is: Inpatient  Remains inpatient appropriate because:Ongoing diagnostic testing needed not appropriate for outpatient work up, Unsafe d/c plan, IV treatments appropriate due to intensity of illness or inability to take PO and Inpatient level of care appropriate due to severity of illness   Dispo: The patient is from: Home              Anticipated d/c is to: Home              Anticipated d/c date is: > 3 days              Patient currently is not medically stable to d/c.  Pending CABG 01/07/2020   Consultants:   Cardiology  Cardiothoracic surgery  Procedures:  TTE 01/15/2020: IMPRESSIONS  1. Left ventricular ejection fraction, by estimation, is <20%. The left  ventricle has severely decreased function. The left ventricle demonstrates  global hypokinesis. The left ventricular internal cavity size was  moderately dilated. Left ventricular  diastolic parameters are consistent with Grade II diastolic dysfunction  (pseudonormalization). Elevated left ventricular end-diastolic pressure.  2. Right ventricular systolic function is normal. The right ventricular  size is normal. There is mildly elevated pulmonary artery systolic  pressure.  3. Left atrial size was mildly dilated.  4. The mitral valve is normal in structure. Moderate mitral valve  regurgitation.  5. The aortic valve is tricuspid. Aortic valve regurgitation is not  visualized. No aortic stenosis is present.  6. The inferior vena cava is dilated in size with <50% respiratory  variability, suggesting right atrial pressure of 15 mmHg.   Antimicrobials:   None   Subjective: Patient seen and examined bedside, resting comfortably.  Shortness of breath and lower extremity edema continues to be resolved.  No complaints this morning.  Patient's pending  CABG planned for 01/17/2020.  No other specific complaints or concerns at this time.  Denies headache, no visual changes, no chest pain, palpitations, shortness of breath, no abdominal pain, no weakness, no fatigue, no paresthesias.  No acute events overnight per nursing staff.  Objective: Vitals:   01/06/20 0352 01/06/20 0436 01/06/20 0800 01/06/20 1009  BP: 132/70  113/66   Pulse: 82  82 89  Resp: 18  13   Temp: 98 F (36.7 C)  98.2 F (36.8 C)   TempSrc: Oral  Oral   SpO2: 98%  98%   Weight:  57.1 kg    Height:        Intake/Output Summary (Last 24 hours) at 01/06/2020 1057 Last data filed at 01/06/2020 0800 Gross per 24 hour  Intake 1644.52 ml  Output 1401 ml  Net 243.52 ml   Filed Weights   01/04/20 0640 01/05/20 0601 01/06/20 0436  Weight: 55.9 kg 56.9 kg 57.1 kg    Examination:  General exam: Appears calm and comfortable  Respiratory system: Clear to auscultation. Respiratory effort normal.  Oxygenating well on room air Cardiovascular system: S1 & S2 heard, RRR. No JVD, murmurs, rubs, gallops or clicks. No pedal edema. Gastrointestinal system: Abdomen is nondistended, soft and nontender. No organomegaly or masses felt. Normal bowel sounds heard. Central nervous system: Alert and oriented. No focal neurological deficits. Extremities: Symmetric 5 x 5 power. Skin: Slightly tender left medial AC surrounding old IV site; otherwise no other rashes/lesions Psychiatry: Judgement and insight appear normal. Mood & affect appropriate.     Data Reviewed: I have personally reviewed following labs and imaging studies  CBC: Recent Labs  Lab 01/12/2020 0151 01/12/2020 0151 01/05/2020 7342 01/12/2020 0552 12/26/2019 0419  01/17/2020 0955 12/31/2019 1011 01/03/20 0031 01/04/20 0300 01/05/20 0345 01/06/20 0410  WBC 19.2*   < > 12.1*   < > 15.8*  --   --  11.6* 9.5 8.7 9.7  NEUTROABS 15.5*  --  11.4*  --   --   --   --   --   --   --   --   HGB 16.0   < > 12.7*   < > 13.7   < > 14.6  13.5 12.9* 12.9* 13.2  HCT 51.1   < > 42.2   < > 43.2   < > 43.0 42.6 41.8 41.7 43.0  MCV 93.9   < > 93.4   < > 89.6  --   --  89.3 89.7 89.5 90.0  PLT 273   < > 207   < > 220  --   --  220 220 222 225   < > = values in this interval not displayed.   Basic Metabolic Panel: Recent Labs  Lab 01/15/2020 0552 12/31/2019 0552 01/07/2020 1236 01/13/2020 1236 01/10/2020 0419 01/07/2020 0955 01/12/2020 1011 01/03/20 0031 01/04/20 0300 01/05/20 0345 01/06/20 0410  NA 140   < > 138   < > 141   < > 140 139 140 137 137  K 5.1   < > 4.2   < > 4.5   < > 3.7 3.9 3.7 4.0 4.4  CL 107   < > 101   < > 103  --   --  103 103 100 101  CO2 23   < > 26   < > 30  --   --  25 29 26 27   GLUCOSE 136*   < > 157*   < > 132*  --   --  104* 102* 111* 116*  BUN 15   < > 18   < > 27*  --   --  19 13 16 16   CREATININE 0.90   < > 1.14   < > 1.17  --   --  1.09 0.96 1.06 1.06  CALCIUM 8.4*   < > 9.2   < > 8.5*  --   --  8.6* 8.8* 9.0 9.1  MG 2.2  --  2.2  --   --   --   --  2.0 1.9 2.0  --    < > = values in this interval not displayed.   GFR: Estimated Creatinine Clearance: 53.1 mL/min (by C-G formula based on SCr of 1.06 mg/dL). Liver Function Tests: Recent Labs  Lab 01/05/2020 0552 01/03/20 0031 01/05/20 0345  AST 72* 53* 22  ALT 37 35 27  ALKPHOS 99 83 93  BILITOT 0.5 0.4 0.6  PROT 6.6 5.6* 5.7*  ALBUMIN 3.6 3.0* 2.9*   No results for input(s): LIPASE, AMYLASE in the last 168 hours. No results for input(s): AMMONIA in the last 168 hours. Coagulation Profile: Recent Labs  Lab 12/25/2019 0419 01/03/20 0031  INR 1.1 1.1   Cardiac Enzymes: Recent Labs  Lab 01/05/20 2144  CKTOTAL 40*   BNP (last 3 results) No results for input(s): PROBNP in the last 8760 hours. HbA1C: No results for input(s): HGBA1C in the last 72 hours. CBG: No results for input(s): GLUCAP in the last 168 hours. Lipid Profile: No results for input(s): CHOL, HDL, LDLCALC, TRIG, CHOLHDL, LDLDIRECT in the last 72 hours. Thyroid Function  Tests: No results for input(s): TSH, T4TOTAL, FREET4, T3FREE, THYROIDAB in the last 72 hours.  Anemia Panel: No results for input(s): VITAMINB12, FOLATE, FERRITIN, TIBC, IRON, RETICCTPCT in the last 72 hours. Sepsis Labs: No results for input(s): PROCALCITON, LATICACIDVEN in the last 168 hours.  Recent Results (from the past 240 hour(s))  Respiratory Panel by RT PCR (Flu A&B, Covid) - Nasopharyngeal Swab     Status: None   Collection Time: 12/31/2019  1:51 AM   Specimen: Nasopharyngeal Swab  Result Value Ref Range Status   SARS Coronavirus 2 by RT PCR NEGATIVE NEGATIVE Final    Comment: (NOTE) SARS-CoV-2 target nucleic acids are NOT DETECTED.  The SARS-CoV-2 RNA is generally detectable in upper respiratoy specimens during the acute phase of infection. The lowest concentration of SARS-CoV-2 viral copies this assay can detect is 131 copies/mL. A negative result does not preclude SARS-Cov-2 infection and should not be used as the sole basis for treatment or other patient management decisions. A negative result may occur with  improper specimen collection/handling, submission of specimen other than nasopharyngeal swab, presence of viral mutation(s) within the areas targeted by this assay, and inadequate number of viral copies (<131 copies/mL). A negative result must be combined with clinical observations, patient history, and epidemiological information. The expected result is Negative.  Fact Sheet for Patients:  PinkCheek.be  Fact Sheet for Healthcare Providers:  GravelBags.it  This test is no t yet approved or cleared by the [place] FDA and  has been authorized for detection and/or diagnosis of SARS-CoV-2 by FDA under an Emergency Use Authorization (EUA). This EUA will remain  in effect (meaning this test can be used) for the duration of the COVID-19 declaration under Section 564(b)(1) of the Act, 21 U.S.C. section  360bbb-3(b)(1), unless the authorization is terminated or revoked sooner.     Influenza A by PCR NEGATIVE NEGATIVE Final   Influenza B by PCR NEGATIVE NEGATIVE Final    Comment: (NOTE) The Xpert Xpress SARS-CoV-2/FLU/RSV assay is intended as an aid in  the diagnosis of influenza from Nasopharyngeal swab specimens and  should not be used as a sole basis for treatment. Nasal washings and  aspirates are unacceptable for Xpert Xpress SARS-CoV-2/FLU/RSV  testing.  Fact Sheet for Patients: PinkCheek.be  Fact Sheet for Healthcare Providers: GravelBags.it  This test is not yet approved or cleared by the [place] FDA and  has been authorized for detection and/or diagnosis of SARS-CoV-2 by  FDA under an Emergency Use Authorization (EUA). This EUA will remain  in effect (meaning this test can be used) for the duration of the  Covid-19 declaration under Section 564(b)(1) of the Act, 21  U.S.C. section 360bbb-3(b)(1), unless the authorization is  terminated or revoked. Performed at [facility], [place] [address] [place], [place] [address]   MRSA PCR Screening     Status: None   Collection Time: 01/10/2020 10:11 AM   Specimen: Nasal Mucosa; Nasopharyngeal  Result Value Ref Range Status   MRSA by PCR NEGATIVE NEGATIVE Final    Comment:        The GeneXpert MRSA Assay (FDA approved for NASAL specimens only), is one component of a comprehensive MRSA colonization surveillance program. It is not intended to diagnose MRSA infection nor to guide or monitor treatment for MRSA infections. Performed at [facility], [place] [address], [place], [place] [address]          Radiology Studies: CT CHEST W CONTRAST  Result Date: 01/04/2020 CLINICAL DATA:  Pleural effusion, evaluate lung parenchyma preoperative assessment EXAM: CT CHEST WITH CONTRAST TECHNIQUE:  Multidetector CT imaging of the chest  was performed during intravenous contrast administration. CONTRAST:  1m OMNIPAQUE IOHEXOL 350 MG/ML SOLN COMPARISON:  Chest x-rays from October 2011. Cardiac MRI also available. FINDINGS: Cardiovascular: RIGHT-sided central venous access device, PICC line terminates in the caval to atrial junction. Calcified and noncalcified atheromatous plaque of the thoracic aorta. No aneurysmal dilation. Heart size enlarged with LEFT ventricular predominance, see recent cardiac MRI. Calcified three-vessel coronary artery disease. Central pulmonary vasculature normal caliber. Mediastinum/Nodes: No axillary or thoracic inlet adenopathy. No mediastinal lymphadenopathy. Esophagus grossly normal. No hilar lymphadenopathy. Lungs/Pleura: Moderate to marked pulmonary emphysema worse at the lung apices with paraseptal and centrilobular changes. Basilar atelectasis or scarring bilaterally. No consolidation or sign of pleural effusion. Airways are patent Upper Abdomen: Imaged portions of liver and gallbladder are nor. The structures are incompletely imaged. Pancreas without signs of inflammation with respect imaged portions. Spleen is normal. Adrenal glands are normal. Question of mild antral thickening in the stomach, nonspecific on CT. No upper abdominal adenopathy with very limited assessment of upper abdominal structures. Musculoskeletal: No chest wall mass.  Spinal degenerative changes. IMPRESSION: 1. Moderate to marked pulmonary emphysema worse at the lung apices with paraseptal and centrilobular changes. 2. Question of mild antral thickening in the stomach, nonspecific on CT and not well evaluated, correlate with any abdominal symptoms that would suggest gastritis. 3. Calcified three-vessel coronary artery disease. 4. Emphysema and aortic atherosclerosis. Aortic Atherosclerosis (ICD10-I70.0) and Emphysema (ICD10-J43.9). Electronically Signed   By: GZetta BillsM.D.   On: 01/04/2020 12:50   VAS [place] EXTREMITY SAP[place]OUS VEIN  MAPPING  Result Date: 01/04/2020 LOWER EXTREMITY VEIN MAPPING Indications:  Pre-CABG Risk Factors: Hypertension, hyperlipidemia, current smoker, coronary artery               disease, PVD.  Comparison Study: No prior studies. Performing Technologist: RDarlin Coco Examination Guidelines: A complete evaluation includes B-mode imaging, spectral Doppler, color Doppler, and power Doppler as needed of all accessible portions of each vessel. Bilateral testing is considered an integral part of a complete examination. Limited examinations for reoccurring indications may be performed as noted. +--------------+-----------+--------------------+--------------+---------------+  RT Diameter  RT Findings        GSV          LT Diameter    LT Findings        (cm)                                         (cm)                     +--------------+-----------+--------------------+--------------+---------------+                             Saphenofemoral        0.61                                                    Junction                                    +--------------+-----------+--------------------+--------------+---------------+  Proximal thigh        0.50         Partial                                                                   thrombus     +--------------+-----------+--------------------+--------------+---------------+                               Mid thigh           0.28                     +--------------+-----------+--------------------+--------------+---------------+                              Distal thigh         0.33                     +--------------+-----------+--------------------+--------------+---------------+                                  Knee             0.27                     +--------------+-----------+--------------------+--------------+---------------+                               Prox calf           0.22         branching    +--------------+-----------+--------------------+--------------+---------------+                                Mid calf           0.19         Partial                                                                 thrombus and                                                                 branching    +--------------+-----------+--------------------+--------------+---------------+                              Distal calf          0.21                     +--------------+-----------+--------------------+--------------+---------------+ +----------------+-----------+---------------+----------------+----------------+ RT diameter (cm)RT Findings  SSV      LT Diameter (cm)  LT Findings    +----------------+-----------+---------------+----------------+----------------+                            Popliteal fossa      0.29                       +----------------+-----------+---------------+----------------+----------------+                             Proximal calf       0.19         branching     +----------------+-----------+---------------+----------------+----------------+                               Mid calf          0.19      Partial thrombus                                                            and branching   +----------------+-----------+---------------+----------------+----------------+                              Distal calf        0.26                       +----------------+-----------+---------------+----------------+----------------+ Diagnosing physician: Servando Snare MD Electronically signed by Servando Snare MD on 01/04/2020 at 2:10:21 PM.    Final    VAS US DOPPLER PRE CABG  Result Date: 01/04/2020 PREOPERATIVE VASCULAR EVALUATION  Indications:      Pre-CABG. Risk Factors:     Hypertension, hyperlipidemia, current smoker, coronary artery                   disease, PVD. Comparison Study: No prior studies.  Performing Technologist: Darlin Coco  Examination Guidelines: A complete evaluation includes B-mode imaging, spectral Doppler, color Doppler, and power Doppler as needed of all accessible portions of each vessel. Bilateral testing is considered an integral part of a complete examination. Limited examinations for reoccurring indications may be performed as noted.  Right Carotid Findings: +----------+--------+--------+--------+--------+------------------+           PSV cm/sEDV cm/sStenosisDescribeComments           +----------+--------+--------+--------+--------+------------------+ CCA Prox  88      17                      intimal thickening +----------+--------+--------+--------+--------+------------------+ CCA Distal92      15              calcific                   +----------+--------+--------+--------+--------+------------------+ ICA Prox  210     68      60-79%  calcific                   +----------+--------+--------+--------+--------+------------------+ ICA Mid   166     44                                         +----------+--------+--------+--------+--------+------------------+  ICA Distal90      23                                         +----------+--------+--------+--------+--------+------------------+ ECA       94      16                                         +----------+--------+--------+--------+--------+------------------+ Portions of this table do not appear on this page. +----------+--------+-------+----------------+------------+           PSV cm/sEDV cmsDescribe        Arm Pressure +----------+--------+-------+----------------+------------+ Subclavian98             Multiphasic, WNL             +----------+--------+-------+----------------+------------+ +---------+--------+--+--------+--+---------+ VertebralPSV cm/s46EDV cm/s18Antegrade +---------+--------+--+--------+--+---------+ Left Carotid Findings:  +----------+-------+--------+--------+-----------------------+-----------------+           PSV    EDV cm/sStenosisDescribe               Comments                    cm/s                                                            +----------+-------+--------+--------+-----------------------+-----------------+ CCA Prox  113    17                                     intimal                                                                   thickening        +----------+-------+--------+--------+-----------------------+-----------------+ CCA Distal80     18                                                       +----------+-------+--------+--------+-----------------------+-----------------+ ICA Prox  94     23              heterogenous and                                                          calcific                                 +----------+-------+--------+--------+-----------------------+-----------------+ ICA Distal76     25                                                       +----------+-------+--------+--------+-----------------------+-----------------+  ECA       91     13                                                       +----------+-------+--------+--------+-----------------------+-----------------+ +----------+--------+--------+----------------+------------+ SubclavianPSV cm/sEDV cm/sDescribe        Arm Pressure +----------+--------+--------+----------------+------------+           188             Multiphasic, WNL             +----------+--------+--------+----------------+------------+ +---------+--------+---+--------+--+---------+ VertebralPSV cm/s112EDV cm/s21Antegrade +---------+--------+---+--------+--+---------+  ABI Findings: +--------+------------------+-----+----------+--------+ Right   Rt Pressure (mmHg)IndexWaveform  Comment  +--------+------------------+-----+----------+--------+ Brachial                        triphasic          +--------+------------------+-----+----------+--------+ PTA                            biphasic           +--------+------------------+-----+----------+--------+ PERO                           biphasic           +--------+------------------+-----+----------+--------+ DP                             monophasic         +--------+------------------+-----+----------+--------+ +--------+------------------+-----+----------+-------+ Left    Lt Pressure (mmHg)IndexWaveform  Comment +--------+------------------+-----+----------+-------+ [id number]                    triphasic         +--------+------------------+-----+----------+-------+ PTA                            monophasic        +--------+------------------+-----+----------+-------+ PERO                           monophasic        +--------+------------------+-----+----------+-------+  Right Doppler Findings: +--------+--------+-----+---------+--------+ Site    PressureIndexDoppler  Comments +--------+--------+-----+---------+--------+ Brachial             triphasic         +--------+--------+-----+---------+--------+ Radial               triphasic         +--------+--------+-----+---------+--------+ Ulnar                triphasic         +--------+--------+-----+---------+--------+  Left Doppler Findings: +--------+--------+-----+---------+--------+ Site    PressureIndexDoppler  Comments +--------+--------+-----+---------+--------+ [id number]          triphasic         +--------+--------+-----+---------+--------+ Radial               biphasic          +--------+--------+-----+---------+--------+ Ulnar                triphasic         +--------+--------+-----+---------+--------+  Summary: Right Carotid: Velocities in the right ICA are consistent with a 60-79%  stenosis. Left Carotid: Velocities in the left ICA are consistent with a  1-39% stenosis. Vertebrals:  Bilateral vertebral arteries demonstrate antegrade flow. Subclavians: Normal flow hemodynamics were seen in bilateral subclavian              arteries. Right Upper Extremity: Doppler waveforms remain within normal limits with right radial compression. Doppler waveforms remain within normal limits with right ulnar compression. Left Upper Extremity: Doppler waveforms remain within normal limits with left radial compression. Doppler waveforms remain within normal limits with left ulnar compression.  Electronically signed by Servando Snare MD on 01/04/2020 at 2:10:05 PM.    Final         Scheduled Meds: . amiodarone  200 mg Oral BID  . aspirin EC  81 mg Oral Daily  . atorvastatin  80 mg Oral Daily  . chlorhexidine gluconate (MEDLINE KIT)  15 mL Mouth Rinse BID  . Chlorhexidine Gluconate Cloth  6 each Topical Daily  . digoxin  0.125 mg Oral Daily  . feeding supplement  237 mL Oral TID WC  . folic acid  1 mg Oral Daily  . furosemide  20 mg Oral Daily  . influenza vaccine adjuvanted  0.5 mL Intramuscular Tomorrow-1000  . losartan  12.5 mg Oral Daily  . mouth rinse  15 mL Mouth Rinse q12n4p  . multivitamin with minerals  1 tablet Oral Daily  . sodium chloride flush  10-40 mL Intracatheter Q12H  . sodium chloride flush  3 mL Intravenous Q12H  . sodium chloride flush  3 mL Intravenous Q12H  . spironolactone  25 mg Oral Daily  . thiamine  100 mg Oral Daily   Or  . thiamine  100 mg Intravenous Daily   Continuous Infusions: . sodium chloride 250 mL (12/31/2019 1049)  . sodium chloride    . cefTRIAXone (ROCEPHIN)  IV Stopped (01/05/20 2223)  . heparin 1,200 Units/hr (01/06/20 0600)  . milrinone 0.25 mcg/kg/min (01/06/20 0600)     LOS: 5 days    Time spent: 31 minutes spent on chart review, discussion with nursing staff, consultants, updating family and interview/physical exam; more than 50% of that time was spent in counseling and/or coordination of  care.    Oma Marzan J [place] DO [facility] Available via Epic secure chat 7am-7pm After these hours, please refer to coverage provider listed on [email or web address] 01/06/2020, 10:57 AM

## 2020-01-06 NOTE — Progress Notes (Signed)
ANTICOAGULATION CONSULT NOTE  Pharmacy Consult for IV heparin Indication: chest pain/ACS  Allergies  Allergen Reactions  . Lisinopril Swelling    Angioedema in 2020    Patient Measurements: Height: 5' 10.5" (179.1 cm) Weight: 57.1 kg (125 lb 14.1 oz) IBW/kg (Calculated) : 74.15 Heparin Dosing Weight: TBW  Vital Signs: Temp: 98.3 F (36.8 C) (10/16 1130) Temp Source: Oral (10/16 1130) BP: 121/70 (10/16 1130) Pulse Rate: 89 (10/16 1130)  Labs: Recent Labs    01/04/20 0300 01/04/20 0300 01/04/20 1425 01/05/20 0345 01/05/20 2144 01/06/20 0410 01/06/20 0411  HGB 12.9*   < >  --  12.9*  --  13.2  --   HCT 41.8  --   --  41.7  --  43.0  --   PLT 220  --   --  222  --  225  --   HEPARINUNFRC  --   --  0.35 0.43  --   --  0.61  CREATININE 0.96  --   --  1.06  --  1.06  --   CKTOTAL  --   --   --   --  40*  --   --    < > = values in this interval not displayed.    Estimated Creatinine Clearance: 53.1 mL/min (by C-G formula based on SCr of 1.06 mg/dL).   Medical History: Past Medical History:  Diagnosis Date  . Diverticulosis   . Hyperlipidemia   . Hypertension   . Internal hemorrhoids   . PVD (peripheral vascular disease) (HCC)     Medications:  Medications Prior to Admission  Medication Sig Dispense Refill Last Dose  . amLODipine (NORVASC) 10 MG tablet Take 10 mg by mouth daily.   12/31/2019 at Unknown time  . aspirin 325 MG tablet Take 325 mg by mouth daily.   12/31/2019 at Unknown time  . atorvastatin (LIPITOR) 80 MG tablet Take 40 mg by mouth at bedtime.    12/30/2019 at Unknown time  . Cholecalciferol 50 MCG (2000 UT) TABS Take 4,000 Units by mouth daily.   12/31/2019 at Unknown time  . clopidogrel (PLAVIX) 75 MG tablet Take 75 mg by mouth at bedtime.    12/30/2019  . tadalafil (CIALIS) 10 MG tablet Take 5 mg by mouth daily as needed for erectile dysfunction.   > 1 month    Assessment: 61 yoM with no prior cardiac history but PMH HTN/HLD, PAD, tobacco &  EtOH, admitted for acute SOB. Found to have NSTEMI with acutely reduced LVEF 20%. Pharmacy to dose IV heparin.  Heparin drip 1200 uts/hr, heparin level bump op 0.6 this am CABG planned for next week. CBC stable no bleeding noted  Goal of Therapy: Heparin level 0.3-0.7 units/ml Monitor platelets by anticoagulation protocol: Yes  Plan: Decrease heparin drip slightly 1150 uts/hr  Daily HL, CBC  Leota Sauers Pharm.D. CPP, BCPS Clinical Pharmacist [phone] 01/06/2020 2:55 PM

## 2020-01-06 NOTE — TOC Progression Note (Addendum)
Transition of Care [facility]) - Progression Note    Patient Details  Name: Adrian Phillips MRN: [id number] Date of Birth: 22-Aug-1950  [facility]) CM/SW Contact  Leone Haven, RN Phone Number: 01/06/2020, 2:35 PM  Clinical Narrative:    Patient is for Surgery on Monday for CABG, NCM gave him the SA resources. He he only has VA and Medicare part A insurance.  He would like to be set up with [facility] after surgery if needs it.   Expected Discharge Plan: Home/Self Care Barriers to Discharge: Barriers Unresolved (comment)  Expected Discharge Plan and Services Expected Discharge Plan: Home/Self Care   Discharge Planning Services: CM Consult   Living arrangements for the past 2 months: Single Family Home                                       Social Determinants of Health (SDOH) Interventions    Readmission Risk Interventions No flowsheet data found.

## 2020-01-06 NOTE — Plan of Care (Signed)

## 2020-01-06 NOTE — Progress Notes (Signed)
Patient ID: Adrian Phillips, male   DOB: [birth year], 69 y.o.   MRN: [id number]     Advanced Heart Failure Rounding Note  PCP-Cardiologist: Jenkins Rouge, MD   Subjective:    Remains on milrinone 0.25, co-ox 67%.  CVP 8-9  Feels good. Denies CP, shortness of breath or PND.    Cardiac MRI:  1. Moderate LVE with diffuse hypokinesis worse in the septum and apex EF 26% 2. Post gadolinium images with sub-endocardial scar in the mid distal anterior wall, septum and apex. No transmural or full thickness scar suggesting possibility of LVEF recovery with revascularization.    Objective:   Weight Range: 57.1 kg Body mass index is 17.81 kg/m.   Vital Signs:   Temp:  [98 F (36.7 C)-98.4 F (36.9 C)] 98.3 F (36.8 C) (10/16 1130) Pulse Rate:  [74-89] 89 (10/16 1130) Resp:  [13-20] 20 (10/16 1130) BP: (102-132)/(58-70) 121/70 (10/16 1130) SpO2:  [98 %-99 %] 98 % (10/16 1130) Weight:  [57.1 kg] 57.1 kg (10/16 0436) Last BM Date: 01/06/20  Weight change: Filed Weights   01/04/20 0640 01/05/20 0601 01/06/20 0436  Weight: 55.9 kg 56.9 kg 57.1 kg    Intake/Output:   Intake/Output Summary (Last 24 hours) at 01/06/2020 1350 Last data filed at 01/06/2020 1300 Gross per 24 hour  Intake 1263.92 ml  Output 1400 ml  Net -136.08 ml      Physical Exam    General:  Well appearing. No resp difficulty HEENT: normal Neck: supple. no JVD. Carotids 2+ bilat; no bruits. No lymphadenopathy or thryomegaly appreciated. Cor: PMI nondisplaced. Regular rate & rhythm. No rubs, gallops or murmurs. Lungs: clear Abdomen: soft, nontender, nondistended. No hepatosplenomegaly. No bruits or masses. Good bowel sounds. Extremities: no cyanosis, clubbing, rash, edema Neuro: alert & orientedx3, cranial nerves grossly intact. moves all 4 extremities w/o difficulty. Affect pleasant   Telemetry   NSR 80-90s, Personally reviewed   Labs    CBC Recent Labs    01/05/20 0345 01/06/20 0410  WBC  8.7 9.7  HGB 12.9* 13.2  HCT 41.7 43.0  MCV 89.5 90.0  PLT 222 852   Basic Metabolic Panel Recent Labs    01/04/20 0300 01/04/20 0300 01/05/20 0345 01/06/20 0410  NA 140   < > 137 137  K 3.7   < > 4.0 4.4  CL 103   < > 100 101  CO2 29   < > 26 27  GLUCOSE 102*   < > 111* 116*  BUN 13   < > 16 16  CREATININE 0.96   < > 1.06 1.06  CALCIUM 8.8*   < > 9.0 9.1  MG 1.9  --  2.0  --    < > = values in this interval not displayed.   Liver Function Tests Recent Labs    01/05/20 0345  AST 22  ALT 27  ALKPHOS 93  BILITOT 0.6  PROT 5.7*  ALBUMIN 2.9*   No results for input(s): LIPASE, AMYLASE in the last 72 hours. Cardiac Enzymes Recent Labs    01/05/20 2144  CKTOTAL 40*    BNP: BNP (last 3 results) Recent Labs    01/10/2020 0151  BNP 269.4*    ProBNP (last 3 results) No results for input(s): PROBNP in the last 8760 hours.   D-Dimer No results for input(s): DDIMER in the last 72 hours. Hemoglobin A1C No results for input(s): HGBA1C in the last 72 hours. Fasting Lipid Panel No results for input(s): CHOL, HDL,  LDLCALC, TRIG, CHOLHDL, LDLDIRECT in the last 72 hours. Thyroid Function Tests No results for input(s): TSH, T4TOTAL, T3FREE, THYROIDAB in the last 72 hours.  Invalid input(s): FREET3  Other results:   Imaging    No results found.   Medications:     Scheduled Medications: . amiodarone  200 mg Oral BID  . aspirin EC  81 mg Oral Daily  . atorvastatin  80 mg Oral Daily  . chlorhexidine gluconate (MEDLINE KIT)  15 mL Mouth Rinse BID  . Chlorhexidine Gluconate Cloth  6 each Topical Daily  . digoxin  0.125 mg Oral Daily  . feeding supplement  237 mL Oral TID WC  . folic acid  1 mg Oral Daily  . furosemide  20 mg Oral Daily  . influenza vaccine adjuvanted  0.5 mL Intramuscular Tomorrow-1000  . losartan  12.5 mg Oral Daily  . mouth rinse  15 mL Mouth Rinse q12n4p  . multivitamin with minerals  1 tablet Oral Daily  . sodium chloride flush   10-40 mL Intracatheter Q12H  . sodium chloride flush  3 mL Intravenous Q12H  . sodium chloride flush  3 mL Intravenous Q12H  . spironolactone  25 mg Oral Daily  . thiamine  100 mg Oral Daily   Or  . thiamine  100 mg Intravenous Daily    Infusions: . sodium chloride 250 mL (01/16/2020 1049)  . sodium chloride    . cefTRIAXone (ROCEPHIN)  IV Stopped (01/05/20 2223)  . heparin 1,200 Units/hr (01/06/20 0600)  . milrinone 0.25 mcg/kg/min (01/06/20 0600)    PRN Medications: sodium chloride, sodium chloride, acetaminophen, ondansetron (ZOFRAN) IV, oxyCODONE, sodium chloride flush, sodium chloride flush     Assessment/Plan   1. CAD: NSTEMI with cath showing totally occluded RCA with collaterals from LAD, 95% proximal LAD, 80% OM1 and 80% OM2.  Suspect he would be best-served by CABG.  Cardiac MRI showed significant viability, would expect to see improvement in LV function with revascularization. Denies s/s angina - Continue ASA 81 daily.  - Continue atorvastatin 80 mg daily.  - Continue heparin gtt.  - CABG on Monday.   2. Acute systolic CHF: Ischemic cardiomyopathy.  Echo this admission with EF < 20%, normal RV.  RHC showed CI mildly decreased at 1.93 with mildly elevated PCWP and normal RA pressure. He received 1 dose of Entresto and BP dropped significantly, now recovered. Now with PICC in place and on milrinone 0.25, co-ox 67% today with CVP 8. Pre-op cardiac MRI shows significant viability, would expect to see improved LV function with revascularization.  - Continue milrinone 0.25 pre-op.  - Continue Lasix 20 mg po daily. Weight stable. CVP 8 - Continue digoxin 0.125 daily, level 0.5  - Continue spironolactone 25 mg daily.  - Would not retry Entresto given history of angioedema with ACEI.  Continue losartan 12.5 mg daily.  - Will need Impella 5.5 peri-operatively to support CABG with low EF.  3. NSVT: Noted runs this admission.  No ectopy noted now that he is on amiodarone.  - With  CABG in future, started amiodarone 200 mg bid.  - Rhythm stable 4. Active smoker: Needs to quit, counseled.  5. PAD: h/o right fem-pop bypass.  6. Carotid stenosis: Asymptomatic 60-79% RICA stenosis.   Louann Hopson 01/06/2020 1:50 PM

## 2020-01-06 NOTE — Plan of Care (Signed)

## 2020-01-07 DIAGNOSIS — I2510 Atherosclerotic heart disease of native coronary artery without angina pectoris: Secondary | ICD-10-CM | POA: Diagnosis not present

## 2020-01-07 DIAGNOSIS — I25110 Atherosclerotic heart disease of native coronary artery with unstable angina pectoris: Secondary | ICD-10-CM | POA: Diagnosis not present

## 2020-01-07 DIAGNOSIS — I5021 Acute systolic (congestive) heart failure: Secondary | ICD-10-CM | POA: Diagnosis not present

## 2020-01-07 DIAGNOSIS — J9601 Acute respiratory failure with hypoxia: Secondary | ICD-10-CM | POA: Diagnosis not present

## 2020-01-07 LAB — CBC
HCT: 42.9 % (ref 39.0–52.0)
Hemoglobin: 13.6 g/dL (ref 13.0–17.0)
MCH: 28.2 pg (ref 26.0–34.0)
MCHC: 31.7 g/dL (ref 30.0–36.0)
MCV: 88.8 fL (ref 80.0–100.0)
Platelets: 236 10*3/uL (ref 150–400)
RBC: 4.83 MIL/uL (ref 4.22–5.81)
RDW: 14.4 % (ref 11.5–15.5)
WBC: 10.9 10*3/uL — ABNORMAL HIGH (ref 4.0–10.5)
nRBC: 0 % (ref 0.0–0.2)

## 2020-01-07 LAB — HEPARIN LEVEL (UNFRACTIONATED): Heparin Unfractionated: 0.36 IU/mL (ref 0.30–0.70)

## 2020-01-07 LAB — COOXEMETRY PANEL
Carboxyhemoglobin: 1.2 % (ref 0.5–1.5)
Methemoglobin: 0.7 % (ref 0.0–1.5)
O2 Saturation: 60.6 %
Total hemoglobin: 12.5 g/dL (ref 12.0–16.0)

## 2020-01-07 LAB — BASIC METABOLIC PANEL
Anion gap: 8 (ref 5–15)
BUN: 14 mg/dL (ref 8–23)
CO2: 28 mmol/L (ref 22–32)
Calcium: 9.2 mg/dL (ref 8.9–10.3)
Chloride: 100 mmol/L (ref 98–111)
Creatinine, Ser: 1.06 mg/dL (ref 0.61–1.24)
GFR, Estimated: >60 mL/min (ref >60)
Glucose, Bld: 108 mg/dL — ABNORMAL HIGH (ref 70–99)
Potassium: 4.8 mmol/L (ref 3.5–5.1)
Sodium: 136 mmol/L (ref 135–145)

## 2020-01-07 LAB — ABO/RH: ABO/RH(D): O POS

## 2020-01-07 LAB — PREPARE RBC (CROSSMATCH)

## 2020-01-07 MED ORDER — CHLORHEXIDINE GLUCONATE 4 % EX LIQD
60.0000 mL | Freq: Once | CUTANEOUS | Status: AC
  Administered 2020-01-07: 4 via TOPICAL
  Filled 2020-01-07: qty 15

## 2020-01-07 MED ORDER — CHLORHEXIDINE GLUCONATE 0.12 % MT SOLN
15.0000 mL | Freq: Once | OROMUCOSAL | Status: AC
  Administered 2020-01-08: 15 mL via OROMUCOSAL
  Filled 2020-01-07: qty 15

## 2020-01-07 MED ORDER — CHLORHEXIDINE GLUCONATE 4 % EX LIQD
60.0000 mL | Freq: Once | CUTANEOUS | Status: AC
  Administered 2020-01-08: 4 via TOPICAL
  Filled 2020-01-07: qty 60

## 2020-01-07 MED ORDER — TEMAZEPAM 15 MG PO CAPS: 15.0000 mg | ORAL_CAPSULE | Freq: Once | ORAL | Status: DC | PRN

## 2020-01-07 MED ORDER — BISACODYL 5 MG PO TBEC
5.0000 mg | DELAYED_RELEASE_TABLET | Freq: Once | ORAL | Status: AC
  Administered 2020-01-07: 5 mg via ORAL
  Filled 2020-01-07: qty 1

## 2020-01-07 NOTE — Progress Notes (Signed)
      [address]       [place] [address]             [phone]       Visited with the patient today and asked if he has any questions regarding surgery tomorrow. Patient is ready for surgery tomorrow with no questions.    Jari Favre, PA-C

## 2020-01-07 NOTE — Plan of Care (Signed)
  Problem: Education: Goal: Knowledge of General Education information will improve Description: Including pain rating scale, medication(s)/side effects and non-pharmacologic comfort measures Outcome: Progressing   Problem: Clinical Measurements: Goal: Ability to maintain clinical measurements within normal limits will improve Outcome: Progressing   Problem: Nutrition: Goal: Adequate nutrition will be maintained Outcome: Progressing   Problem: Coping: Goal: Level of anxiety will decrease Outcome: Progressing   Problem: Pain Managment: Goal: General experience of comfort will improve Outcome: Progressing   

## 2020-01-07 NOTE — Progress Notes (Signed)
Patient ID: Adrian Phillips, male   DOB: Aug 03, 1950, 69 y.o.   MRN: [id number]     Advanced Heart Failure Rounding Note  PCP-Cardiologist: Jenkins Rouge, MD   Subjective:    Remains on milrinone 0.25, co-ox 61%.  CVP 7-8  Feels good. No CP, SOB, orthopnea or PND. Says he is ready for surgery.   Cardiac MRI:  1. Moderate LVE with diffuse hypokinesis worse in the septum and apex EF 26% 2. Post gadolinium images with sub-endocardial scar in the mid distal anterior wall, septum and apex. No transmural or full thickness scar suggesting possibility of LVEF recovery with revascularization.    Objective:   Weight Range: 55.3 kg Body mass index is 17.24 kg/m.   Vital Signs:   Temp:  [97.5 F (36.4 C)-98.4 F (36.9 C)] 98.4 F (36.9 C) (10/17 1132) Pulse Rate:  [78-104] 104 (10/17 1132) Resp:  [15-20] 20 (10/17 1132) BP: (109-140)/(61-75) 109/71 (10/17 1132) SpO2:  [90 %-98 %] 95 % (10/17 1132) Weight:  [55.3 kg] 55.3 kg (10/17 0456) Last BM Date: 01/06/20  Weight change: Filed Weights   01/05/20 0601 01/06/20 0436 01/07/20 0456  Weight: 56.9 kg 57.1 kg 55.3 kg    Intake/Output:   Intake/Output Summary (Last 24 hours) at 01/07/2020 1204 Last data filed at 01/07/2020 1200 Gross per 24 hour  Intake 1284.07 ml  Output 1000 ml  Net 284.07 ml      Physical Exam    General:  Well appearing. No resp difficulty HEENT: normal Neck: supple. no JVD. Carotids 2+ bilat; no bruits. No lymphadenopathy or thryomegaly appreciated. Cor: PMI nondisplaced. Regular rate & rhythm. No rubs, gallops or murmurs. Lungs: clear Abdomen: soft, nontender, nondistended. No hepatosplenomegaly. No bruits or masses. Good bowel sounds. Extremities: no cyanosis, clubbing, rash, edema Neuro: alert & orientedx3, cranial nerves grossly intact. moves all 4 extremities w/o difficulty. Affect pleasant   Telemetry   NSR 90-100, Personally reviewed   Labs    CBC Recent Labs    01/06/20 0410  01/07/20 0404  WBC 9.7 10.9*  HGB 13.2 13.6  HCT 43.0 42.9  MCV 90.0 88.8  PLT 225 852   Basic Metabolic Panel Recent Labs    01/05/20 0345 01/05/20 0345 01/06/20 0410 01/07/20 0404  NA 137   < > 137 136  K 4.0   < > 4.4 4.8  CL 100   < > 101 100  CO2 26   < > 27 28  GLUCOSE 111*   < > 116* 108*  BUN 16   < > 16 14  CREATININE 1.06   < > 1.06 1.06  CALCIUM 9.0   < > 9.1 9.2  MG 2.0  --   --   --    < > = values in this interval not displayed.   Liver Function Tests Recent Labs    01/05/20 0345  AST 22  ALT 27  ALKPHOS 93  BILITOT 0.6  PROT 5.7*  ALBUMIN 2.9*   No results for input(s): LIPASE, AMYLASE in the last 72 hours. Cardiac Enzymes Recent Labs    01/05/20 2144  CKTOTAL 40*    BNP: BNP (last 3 results) Recent Labs    01/13/2020 0151  BNP 269.4*    ProBNP (last 3 results) No results for input(s): PROBNP in the last 8760 hours.   D-Dimer No results for input(s): DDIMER in the last 72 hours. Hemoglobin A1C No results for input(s): HGBA1C in the last 72 hours. Fasting Lipid Panel  No results for input(s): CHOL, HDL, LDLCALC, TRIG, CHOLHDL, LDLDIRECT in the last 72 hours. Thyroid Function Tests No results for input(s): TSH, T4TOTAL, T3FREE, THYROIDAB in the last 72 hours.  Invalid input(s): FREET3  Other results:   Imaging    No results found.   Medications:     Scheduled Medications: . amiodarone  200 mg Oral BID  . aspirin EC  81 mg Oral Daily  . atorvastatin  80 mg Oral Daily  . chlorhexidine gluconate (MEDLINE KIT)  15 mL Mouth Rinse BID  . Chlorhexidine Gluconate Cloth  6 each Topical Daily  . digoxin  0.125 mg Oral Daily  . [START ON 01/10/2020] epinephrine  0-10 mcg/min Intravenous To OR  . feeding supplement  237 mL Oral TID WC  . folic acid  1 mg Oral Daily  . furosemide  20 mg Oral Daily  . [START ON 01/15/2020] heparin-papaverine-plasmalyte irrigation   Irrigation To OR  . influenza vaccine adjuvanted  0.5 mL  Intramuscular Tomorrow-1000  . [START ON 01/10/2020] insulin   Intravenous To OR  . losartan  12.5 mg Oral Daily  . [START ON 01/06/2020] magnesium sulfate  40 mEq Other To OR  . mouth rinse  15 mL Mouth Rinse q12n4p  . multivitamin with minerals  1 tablet Oral Daily  . [START ON 01/04/2020] phenylephrine  30-200 mcg/min Intravenous To OR  . [START ON 01/05/2020] potassium chloride  80 mEq Other To OR  . sodium chloride flush  10-40 mL Intracatheter Q12H  . sodium chloride flush  3 mL Intravenous Q12H  . sodium chloride flush  3 mL Intravenous Q12H  . spironolactone  25 mg Oral Daily  . thiamine  100 mg Oral Daily   Or  . thiamine  100 mg Intravenous Daily  . [START ON 01/09/2020] tranexamic acid  15 mg/kg Intravenous To OR  . [START ON 01/18/2020] tranexamic acid  2 mg/kg Intracatheter To OR    Infusions: . sodium chloride 250 mL (01/06/2020 1049)  . sodium chloride    . cefTRIAXone (ROCEPHIN)  IV 1 g (01/06/20 2127)  . [START ON 12/28/2019] cefUROXime (ZINACEF)  IV    . [START ON 01/19/2020] cefUROXime (ZINACEF)  IV    . [START ON 12/29/2019] dexmedetomidine    . [START ON 01/10/2020] heparin 30,000 units/NS 1000 mL solution for CELLSAVER    . heparin 1,150 Units/hr (01/07/20 0820)  . milrinone 0.25 mcg/kg/min (01/06/20 1731)  . [START ON 12/29/2019] milrinone    . [START ON 01/16/2020] nitroGLYCERIN    . [START ON 01/05/2020] norepinephrine    . [START ON 12/26/2019] tranexamic acid (CYKLOKAPRON) infusion ([facility])    . [START ON 01/20/2020] vancomycin      PRN Medications: sodium chloride, sodium chloride, acetaminophen, ondansetron (ZOFRAN) IV, oxyCODONE, sodium chloride flush, sodium chloride flush     Assessment/Plan   1. CAD: NSTEMI with cath showing totally occluded RCA with collaterals from LAD, 95% proximal LAD, 80% OM1 and 80% OM2.  Suspect he would be best-served by CABG.  Cardiac MRI showed significant viability, would expect to see improvement in LV function with  revascularization. Denies s/s angina - Continue ASA 81 daily.  - Continue atorvastatin 80 mg daily.  - Continue heparin gtt. No bleeding   - CABG tomorrow  2. Acute systolic CHF: Ischemic cardiomyopathy.  Echo this admission with EF < 20%, normal RV.  RHC showed CI mildly decreased at 1.93 with mildly elevated PCWP and normal RA pressure. He received 1 dose of  Entresto and BP dropped significantly, now recovered. Now with PICC in place and on milrinone 0.25, co-ox 67% today with CVP 8. Pre-op cardiac MRI shows significant viability, would expect to see improved LV function with revascularization.  - Continue milrinone 0.25 pre-op. Co-ox stable 61% - Continue Lasix 20 mg po daily. Weight down. CVP 8 - Continue digoxin 0.125 daily, level 0.5  - Continue spironolactone 25 mg daily.  - Would not retry Entresto given history of angioedema with ACEI.  Continue losartan 12.5 mg daily.  - Will need Impella 5.5 peri-operatively to support CABG with low EF.  3. NSVT: Noted runs this admission.  No ectopy noted now that he is on amiodarone.  - With CABG in future, started amiodarone 200 mg bid.  - Rhythm stable 4. Active smoker: Needs to quit, counseled.  5. PAD: h/o right fem-pop bypass.  6. Carotid stenosis: Asymptomatic 60-79% RICA stenosis.   Quillian Quince Delawrence Fridman 01/07/2020 12:04 PM

## 2020-01-07 NOTE — Progress Notes (Signed)
PROGRESS NOTE    KAYLIB FURNESS  FIE:[id number] DOB: [birth year] DOA: 01/10/2020 PCP: Bea Laura, MD    Brief Narrative:  Adrian Phillips is a 69 year old male with past medical history notable for essential hypertension, hyperlipidemia, peripheral vascular disease, tobacco use disorder, EtOH abuse who presented to the emergency department with worsening shortness of breath.  Patient also reports increased lower extremity edema over the past few weeks.  In the ED, chest x-ray notable for vascular congestion, EKG with sinus tachycardia, LVH.  SBP elevated 190s.  Troponin elevated 1187 with a BNP of 269.  WBC count 19.2.  Patient was started on IV Lasix, nitroglycerin drip, and heparin drip.  Cardiology was consulted.  TRH consulted for admission for acute CHF exacerbation with hypertensive urgency and NSTEMI.   Assessment & Plan:   Principal Problem:   Acute CHF (congestive heart failure) (HCC) Active Problems:   Acute respiratory failure with hypoxia (HCC)   Hypertensive urgency   Tobacco abuse   Coronary artery disease involving native coronary artery of native heart with unstable angina pectoris (HCC)   CAD in native artery   Acute systolic CHF (congestive heart failure) (HCC)   Acute systolic congestive heart failure, new diagnosis Acute hypoxic respiratory failure Ischemic cardiomyopathy Patient presenting to the ED with progressive shortness of breath associated with lower extremity edema.  Chest x-ray notable for basilar congestion with elevated BNP. TTE 12/23/2019 with LVEF less than 20%, LV severely decreased function with global hypokinesis, grade 2 diastolic dysfunction, LA mildly dilated, moderate MR, IVC dilated. (Hx angioedema with ACEi, also avoiding Entresto).  Cardiac MRI 01/03/2020 diffuse hypokinesis septum/apex with EF 26%, subendocardial scar mid distal anterior wall, septum, apex but no transmural or full-thickness scar suggesting possibility of  LVEF recovery with revascularization --Cardiology/CTS following, appreciate assistance --net negative 1.2L past 24h, net negative 5.2L since admission --Digoxin 0.125 mg p.o. daily --Spironolactone 25 mg p.o. daily --Losartan 12.5 mg p.o. daily --Lasix 64m PO daily --PICC line placed 10/13 for milrinone infusion for low cardiac index on LHC --pending CABG w/ Impella 01/06/2020  NSTEMI Multivessel coronary artery disease Patient presenting with elevated troponin 1187, 4615.  Underwent left heart catheterization on 12/30/2019 with severely calcified eccentric proximal LAD, moderate proximal circumflex 70%, 75% ostial first obtuse marginal, 99% OM 2 and chronically occluded RCA with left-to-right collaterals. --Cardiology/CTS following, appreciate assistance --Continue atorvastatin 80 mg p.o. daily --Holding Plavix for planned CABG 12/26/2019 w/ Impella  Nonsustained ventricular tachycardia --Started on amiodarone 200 mg p.o. BID --Monitor on telemetry  Left Upper Extremity cellulitis vs phlebitis Overnight 10/15, patient left AC IV infiltrated causing pain and eythema. CK level low at 40.  --Ceftriaxone 1g IV q24h x 5 days --warm compresses --closely monitor area and patients symptoms, if worsen; will consider soft tissue vs vas dulpex [place] extremity.  Hypertensive emergency: Resolved Presenting with SBP's 190s.  Initially started on nitroglycerin drip, which has now been titrated off. --BP 132/70, this morning --Started on losartan 12.5 g p.o. daily, spironolactone 25 mg p.o. daily, furosemide 226mPO daily  Peripheral vascular disease with lower right lower extremity bypass History of right femoropopliteal bypass 2005. --Holding Plavix as above  --Continue statin  Tobacco use disorder Alcohol abuse Discussed need for complete EtOH cessation --Thiamine, folic acid, multivitamin   DVT prophylaxis: Heparin drip Code Status: Full code Family Communication: Updated  patient extensively at bedside  Disposition Plan:  Status is: Inpatient  Remains inpatient appropriate because:Ongoing diagnostic testing needed not appropriate for outpatient  work up, Unsafe d/c plan, IV treatments appropriate due to intensity of illness or inability to take PO and Inpatient level of care appropriate due to severity of illness   Dispo: The patient is from: Home              Anticipated d/c is to: Home              Anticipated d/c date is: > 3 days              Patient currently is not medically stable to d/c.  Pending CABG 01/19/2020   Consultants:   Cardiology  Cardiothoracic surgery  Procedures:  TTE 01/10/2020: IMPRESSIONS  1. Left ventricular ejection fraction, by estimation, is <20%. The left  ventricle has severely decreased function. The left ventricle demonstrates  global hypokinesis. The left ventricular internal cavity size was  moderately dilated. Left ventricular  diastolic parameters are consistent with Grade II diastolic dysfunction  (pseudonormalization). Elevated left ventricular end-diastolic pressure.  2. Right ventricular systolic function is normal. The right ventricular  size is normal. There is mildly elevated pulmonary artery systolic  pressure.  3. Left atrial size was mildly dilated.  4. The mitral valve is normal in structure. Moderate mitral valve  regurgitation.  5. The aortic valve is tricuspid. Aortic valve regurgitation is not  visualized. No aortic stenosis is present.  6. The inferior vena cava is dilated in size with <50% respiratory  variability, suggesting right atrial pressure of 15 mmHg.   Antimicrobials:   None   Subjective: Patient seen and examined bedside, resting comfortably.  Nursing present.  No complaints.  Ready for surgery planned for tomorrow. Denies headache, no visual changes, no chest pain, palpitations, shortness of breath, no abdominal pain, no weakness, no fatigue, no paresthesias.  No acute  events overnight per nursing staff.  Objective: Vitals:   01/06/20 2122 01/07/20 0041 01/07/20 0456 01/07/20 0745  BP: 125/67 117/68 115/61 140/69  Pulse: 82 83 78 89  Resp: 19 18 16 18   Temp: 98.4 F (36.9 C) 98 F (36.7 C) (!) 97.5 F (36.4 C) 98 F (36.7 C)  TempSrc: Oral Oral Oral Oral  SpO2: 95% 97% 90% 95%  Weight:   55.3 kg   Height:        Intake/Output Summary (Last 24 hours) at 01/07/2020 1041 Last data filed at 01/07/2020 0743 Gross per 24 hour  Intake 1044.07 ml  Output 1300 ml  Net -255.93 ml   Filed Weights   01/05/20 0601 01/06/20 0436 01/07/20 0456  Weight: 56.9 kg 57.1 kg 55.3 kg    Examination:  General exam: Appears calm and comfortable  Respiratory system: Clear to auscultation. Respiratory effort normal.  Oxygenating well on room air Cardiovascular system: S1 & S2 heard, RRR. No JVD, murmurs, rubs, gallops or clicks. No pedal edema. Gastrointestinal system: Abdomen is nondistended, soft and nontender. No organomegaly or masses felt. Normal bowel sounds heard. Central nervous system: Alert and oriented. No focal neurological deficits. Extremities: Symmetric 5 x 5 power. Skin: Slightly tender left medial AC surrounding old IV site; otherwise no other rashes/lesions Psychiatry: Judgement and insight appear normal. Mood & affect appropriate.     Data Reviewed: I have personally reviewed following labs and imaging studies  CBC: Recent Labs  Lab 01/04/2020 0151 12/27/2019 0151 12/31/2019 0552 01/10/2020 0419 01/03/20 0031 01/04/20 0300 01/05/20 0345 01/06/20 0410 01/07/20 0404  WBC 19.2*   < > 12.1*   < > 11.6* 9.5 8.7 9.7 10.9*  NEUTROABS 15.5*  --  11.4*  --   --   --   --   --   --   HGB 16.0   < > 12.7*   < > 13.5 12.9* 12.9* 13.2 13.6  HCT 51.1   < > 42.2   < > 42.6 41.8 41.7 43.0 42.9  MCV 93.9   < > 93.4   < > 89.3 89.7 89.5 90.0 88.8  PLT 273   < > 207   < > 220 220 222 225 236   < > = values in this interval not displayed.   Basic  Metabolic Panel: Recent Labs  Lab 01/21/2020 0552 12/25/2019 0552 01/04/2020 1236 12/27/2019 0419 01/03/20 0031 01/04/20 0300 01/05/20 0345 01/06/20 0410 01/07/20 0404  NA 140   < > 138   < > 139 140 137 137 136  K 5.1   < > 4.2   < > 3.9 3.7 4.0 4.4 4.8  CL 107   < > 101   < > 103 103 100 101 100  CO2 23   < > 26   < > 25 29 26 27 28   GLUCOSE 136*   < > 157*   < > 104* 102* 111* 116* 108*  BUN 15   < > 18   < > 19 13 16 16 14   CREATININE 0.90   < > 1.14   < > 1.09 0.96 1.06 1.06 1.06  CALCIUM 8.4*   < > 9.2   < > 8.6* 8.8* 9.0 9.1 9.2  MG 2.2  --  2.2  --  2.0 1.9 2.0  --   --    < > = values in this interval not displayed.   GFR: Estimated Creatinine Clearance: 51.4 mL/min (by C-G formula based on SCr of 1.06 mg/dL). Liver Function Tests: Recent Labs  Lab 12/30/2019 0552 01/03/20 0031 01/05/20 0345  AST 72* 53* 22  ALT 37 35 27  ALKPHOS 99 83 93  BILITOT 0.5 0.4 0.6  PROT 6.6 5.6* 5.7*  ALBUMIN 3.6 3.0* 2.9*   No results for input(s): LIPASE, AMYLASE in the last 168 hours. No results for input(s): AMMONIA in the last 168 hours. Coagulation Profile: Recent Labs  Lab 01/07/2020 0419 01/03/20 0031  INR 1.1 1.1   Cardiac Enzymes: Recent Labs  Lab 01/05/20 2144  CKTOTAL 40*   BNP (last 3 results) No results for input(s): PROBNP in the last 8760 hours. HbA1C: No results for input(s): HGBA1C in the last 72 hours. CBG: No results for input(s): GLUCAP in the last 168 hours. Lipid Profile: No results for input(s): CHOL, HDL, LDLCALC, TRIG, CHOLHDL, LDLDIRECT in the last 72 hours. Thyroid Function Tests: No results for input(s): TSH, T4TOTAL, FREET4, T3FREE, THYROIDAB in the last 72 hours. Anemia Panel: No results for input(s): VITAMINB12, FOLATE, FERRITIN, TIBC, IRON, RETICCTPCT in the last 72 hours. Sepsis Labs: No results for input(s): PROCALCITON, LATICACIDVEN in the last 168 hours.  Recent Results (from the past 240 hour(s))  Respiratory Panel by RT PCR (Flu A&B,  Covid) - Nasopharyngeal Swab     Status: None   Collection Time: 12/22/2019  1:51 AM   Specimen: Nasopharyngeal Swab  Result Value Ref Range Status   SARS Coronavirus 2 by RT PCR NEGATIVE NEGATIVE Final    Comment: (NOTE) SARS-CoV-2 target nucleic acids are NOT DETECTED.  The SARS-CoV-2 RNA is generally detectable in upper respiratoy specimens during the acute phase of infection. The lowest concentration of SARS-CoV-2  viral copies this assay can detect is 131 copies/mL. A negative result does not preclude SARS-Cov-2 infection and should not be used as the sole basis for treatment or other patient management decisions. A negative result may occur with  improper specimen collection/handling, submission of specimen other than nasopharyngeal swab, presence of viral mutation(s) within the areas targeted by this assay, and inadequate number of viral copies (<131 copies/mL). A negative result must be combined with clinical observations, patient history, and epidemiological information. The expected result is Negative.  Fact Sheet for Patients:  PinkCheek.be  Fact Sheet for Healthcare Providers:  GravelBags.it  This test is no t yet approved or cleared by the [place] FDA and  has been authorized for detection and/or diagnosis of SARS-CoV-2 by FDA under an Emergency Use Authorization (EUA). This EUA will remain  in effect (meaning this test can be used) for the duration of the COVID-19 declaration under Section 564(b)(1) of the Act, 21 U.S.C. section 360bbb-3(b)(1), unless the authorization is terminated or revoked sooner.     Influenza A by PCR NEGATIVE NEGATIVE Final   Influenza B by PCR NEGATIVE NEGATIVE Final    Comment: (NOTE) The Xpert Xpress SARS-CoV-2/FLU/RSV assay is intended as an aid in  the diagnosis of influenza from Nasopharyngeal swab specimens and  should not be used as a sole basis for treatment. Nasal  washings and  aspirates are unacceptable for Xpert Xpress SARS-CoV-2/FLU/RSV  testing.  Fact Sheet for Patients: PinkCheek.be  Fact Sheet for Healthcare Providers: GravelBags.it  This test is not yet approved or cleared by the [place] FDA and  has been authorized for detection and/or diagnosis of SARS-CoV-2 by  FDA under an Emergency Use Authorization (EUA). This EUA will remain  in effect (meaning this test can be used) for the duration of the  Covid-19 declaration under Section 564(b)(1) of the Act, 21  U.S.C. section 360bbb-3(b)(1), unless the authorization is  terminated or revoked. Performed at [facility], [place] [address], [place], [place] [address]   MRSA PCR Screening     Status: None   Collection Time: 12/28/2019 10:11 AM   Specimen: Nasal Mucosa; Nasopharyngeal  Result Value Ref Range Status   MRSA by PCR NEGATIVE NEGATIVE Final    Comment:        The GeneXpert MRSA Assay (FDA approved for NASAL specimens only), is one component of a comprehensive MRSA colonization surveillance program. It is not intended to diagnose MRSA infection nor to guide or monitor treatment for MRSA infections. Performed at [facility], [place] [address], [place], [place] [address]          Radiology Studies: No results found.      Scheduled Meds: . amiodarone  200 mg Oral BID  . aspirin EC  81 mg Oral Daily  . atorvastatin  80 mg Oral Daily  . chlorhexidine gluconate (MEDLINE KIT)  15 mL Mouth Rinse BID  . Chlorhexidine Gluconate Cloth  6 each Topical Daily  . digoxin  0.125 mg Oral Daily  . [START ON 01/13/2020] epinephrine  0-10 mcg/min Intravenous To OR  . feeding supplement  237 mL Oral TID WC  . folic acid  1 mg Oral Daily  . furosemide  20 mg Oral Daily  . [START ON 12/22/2019] heparin-papaverine-plasmalyte irrigation   Irrigation To OR  . influenza vaccine adjuvanted   0.5 mL Intramuscular Tomorrow-1000  . [START ON 01/15/2020] insulin   Intravenous To OR  . losartan  12.5 mg Oral  Daily  . [START ON 12/26/2019] magnesium sulfate  40 mEq Other To OR  . mouth rinse  15 mL Mouth Rinse q12n4p  . multivitamin with minerals  1 tablet Oral Daily  . [START ON 01/15/2020] phenylephrine  30-200 mcg/min Intravenous To OR  . [START ON 12/22/2019] potassium chloride  80 mEq Other To OR  . sodium chloride flush  10-40 mL Intracatheter Q12H  . sodium chloride flush  3 mL Intravenous Q12H  . sodium chloride flush  3 mL Intravenous Q12H  . spironolactone  25 mg Oral Daily  . thiamine  100 mg Oral Daily   Or  . thiamine  100 mg Intravenous Daily  . [START ON 01/17/2020] tranexamic acid  15 mg/kg Intravenous To OR  . [START ON 01/17/2020] tranexamic acid  2 mg/kg Intracatheter To OR   Continuous Infusions: . sodium chloride 250 mL (01/19/2020 1049)  . sodium chloride    . cefTRIAXone (ROCEPHIN)  IV 1 g (01/06/20 2127)  . [START ON 12/30/2019] cefUROXime (ZINACEF)  IV    . [START ON 12/30/2019] cefUROXime (ZINACEF)  IV    . [START ON 01/10/2020] dexmedetomidine    . [START ON 12/29/2019] heparin 30,000 units/NS 1000 mL solution for CELLSAVER    . heparin 1,150 Units/hr (01/07/20 0820)  . milrinone 0.25 mcg/kg/min (01/06/20 1731)  . [START ON 12/29/2019] milrinone    . [START ON 12/22/2019] nitroGLYCERIN    . [START ON 12/25/2019] norepinephrine    . [START ON 12/22/2019] tranexamic acid (CYKLOKAPRON) infusion ([facility])    . [START ON 01/07/2020] vancomycin       LOS: 6 days    Time spent: 31 minutes spent on chart review, discussion with nursing staff, consultants, updating family and interview/physical exam; more than 50% of that time was spent in counseling and/or coordination of care.    Lovie Zarling J [place] DO [facility] Available via Epic secure chat 7am-7pm After these hours, please refer to coverage provider listed on [email or web address] 01/07/2020, 10:41 AM

## 2020-01-07 NOTE — Progress Notes (Signed)
ANTICOAGULATION CONSULT NOTE  Pharmacy Consult for IV heparin Indication: chest pain/ACS  Allergies  Allergen Reactions  . Lisinopril Swelling    Angioedema in 2020    Patient Measurements: Height: 5' 10.5" (179.1 cm) Weight: 55.3 kg (121 lb 14.4 oz) IBW/kg (Calculated) : 74.15 Heparin Dosing Weight: TBW  Vital Signs: Temp: 98.4 F (36.9 C) (10/17 1132) Temp Source: Oral (10/17 1132) BP: 109/71 (10/17 1132) Pulse Rate: 104 (10/17 1132)  Labs: Recent Labs    01/05/20 0345 01/05/20 0345 01/05/20 2144 01/06/20 0410 01/06/20 0411 01/07/20 0404  HGB 12.9*   < >  --  13.2  --  13.6  HCT 41.7  --   --  43.0  --  42.9  PLT 222  --   --  225  --  236  HEPARINUNFRC 0.43  --   --   --  0.61 0.36  CREATININE 1.06  --   --  1.06  --  1.06  CKTOTAL  --   --  40*  --   --   --    < > = values in this interval not displayed.    Estimated Creatinine Clearance: 51.4 mL/min (by C-G formula based on SCr of 1.06 mg/dL).   Medical History: Past Medical History:  Diagnosis Date  . Diverticulosis   . Hyperlipidemia   . Hypertension   . Internal hemorrhoids   . PVD (peripheral vascular disease) (HCC)     Medications:  Medications Prior to Admission  Medication Sig Dispense Refill Last Dose  . amLODipine (NORVASC) 10 MG tablet Take 10 mg by mouth daily.   12/31/2019 at Unknown time  . aspirin 325 MG tablet Take 325 mg by mouth daily.   12/31/2019 at Unknown time  . atorvastatin (LIPITOR) 80 MG tablet Take 40 mg by mouth at bedtime.    12/30/2019 at Unknown time  . Cholecalciferol 50 MCG (2000 UT) TABS Take 4,000 Units by mouth daily.   12/31/2019 at Unknown time  . clopidogrel (PLAVIX) 75 MG tablet Take 75 mg by mouth at bedtime.    12/30/2019  . tadalafil (CIALIS) 10 MG tablet Take 5 mg by mouth daily as needed for erectile dysfunction.   > 1 month    Assessment: 26 yoM with no prior cardiac history but PMH HTN/HLD, PAD, tobacco & EtOH, admitted for acute SOB. Found to have  NSTEMI with acutely reduced LVEF 20%. Pharmacy to dose IV heparin.  Heparin level therapeutic at 0.36  After decreasing drip rate slightly to 1150 units/hr. CABG planned for tomorrow. CBC stable no bleeding noted  Goal of Therapy: Heparin level 0.3-0.7 units/ml Monitor platelets by anticoagulation protocol: Yes  Plan: Continue heparin drip at 1150 uts/hr  Follow-up postop Daily HL, CBC  Tama Headings, PharmD PGY2 Cardiology Pharmacy Resident Phone: [phone] 01/07/2020  2:35 PM  Please check [email or web address] for unit-specific pharmacy phone numbers.

## 2020-01-08 ENCOUNTER — Encounter (HOSPITAL_COMMUNITY): Payer: Self-pay | Admitting: Internal Medicine

## 2020-01-08 ENCOUNTER — Inpatient Hospital Stay (HOSPITAL_COMMUNITY): Payer: No Typology Code available for payment source | Admitting: Anesthesiology

## 2020-01-08 ENCOUNTER — Inpatient Hospital Stay (HOSPITAL_COMMUNITY): Payer: No Typology Code available for payment source

## 2020-01-08 ENCOUNTER — Inpatient Hospital Stay (HOSPITAL_COMMUNITY): Admission: EM | Disposition: E | Payer: Self-pay | Source: Home / Self Care | Attending: Internal Medicine

## 2020-01-08 DIAGNOSIS — L8990 Pressure ulcer of unspecified site, unspecified stage: Secondary | ICD-10-CM | POA: Insufficient documentation

## 2020-01-08 HISTORY — PX: ENDOVEIN HARVEST OF GREATER SAPHENOUS VEIN: SHX5059

## 2020-01-08 HISTORY — PX: PLACEMENT OF IMPELLA LEFT VENTRICULAR ASSIST DEVICE: SHX6519

## 2020-01-08 HISTORY — PX: TEE WITHOUT CARDIOVERSION: SHX5443

## 2020-01-08 HISTORY — PX: CORONARY ARTERY BYPASS GRAFT: SHX141

## 2020-01-08 LAB — POCT I-STAT 7, (LYTES, BLD GAS, ICA,H+H)
Acid-Base Excess: 2 mmol/L (ref 0.0–2.0)
Acid-base deficit: 1 mmol/L (ref 0.0–2.0)
Acid-base deficit: 3 mmol/L — ABNORMAL HIGH (ref 0.0–2.0)
Acid-base deficit: 3 mmol/L — ABNORMAL HIGH (ref 0.0–2.0)
Acid-base deficit: 4 mmol/L — ABNORMAL HIGH (ref 0.0–2.0)
Acid-base deficit: 5 mmol/L — ABNORMAL HIGH (ref 0.0–2.0)
Acid-base deficit: 4 mmol/L — ABNORMAL HIGH (ref 0.0–2.0)
Acid-base deficit: 3 mmol/L — ABNORMAL HIGH (ref 0.0–2.0)
Acid-base deficit: 5 mmol/L — ABNORMAL HIGH (ref 0.0–2.0)
Bicarbonate: 26.8 mmol/L (ref 20.0–28.0)
Bicarbonate: 22 mmol/L (ref 20.0–28.0)
Bicarbonate: 22 mmol/L (ref 20.0–28.0)
Bicarbonate: 24.5 mmol/L (ref 20.0–28.0)
Bicarbonate: 23.2 mmol/L (ref 20.0–28.0)
Bicarbonate: 21.4 mmol/L (ref 20.0–28.0)
Bicarbonate: 22.8 mmol/L (ref 20.0–28.0)
Bicarbonate: 22.4 mmol/L (ref 20.0–28.0)
Bicarbonate: 20.1 mmol/L (ref 20.0–28.0)
Calcium, Ion: 1 mmol/L — ABNORMAL LOW (ref 1.15–1.40)
Calcium, Ion: 0.96 mmol/L — ABNORMAL LOW (ref 1.15–1.40)
Calcium, Ion: 0.96 mmol/L — ABNORMAL LOW (ref 1.15–1.40)
Calcium, Ion: 1.02 mmol/L — ABNORMAL LOW (ref 1.15–1.40)
Calcium, Ion: 1.11 mmol/L — ABNORMAL LOW (ref 1.15–1.40)
Calcium, Ion: 1.46 mmol/L — ABNORMAL HIGH (ref 1.15–1.40)
Calcium, Ion: 1.36 mmol/L (ref 1.15–1.40)
Calcium, Ion: 1.28 mmol/L (ref 1.15–1.40)
Calcium, Ion: 1.23 mmol/L (ref 1.15–1.40)
HCT: 34 % — ABNORMAL LOW (ref 39.0–52.0)
HCT: 26 % — ABNORMAL LOW (ref 39.0–52.0)
HCT: 26 % — ABNORMAL LOW (ref 39.0–52.0)
HCT: 29 % — ABNORMAL LOW (ref 39.0–52.0)
HCT: 22 % — ABNORMAL LOW (ref 39.0–52.0)
HCT: 28 % — ABNORMAL LOW (ref 39.0–52.0)
HCT: 30 % — ABNORMAL LOW (ref 39.0–52.0)
HCT: 24 % — ABNORMAL LOW (ref 39.0–52.0)
HCT: 21 % — ABNORMAL LOW (ref 39.0–52.0)
Hemoglobin: 11.6 g/dL — ABNORMAL LOW (ref 13.0–17.0)
Hemoglobin: 8.8 g/dL — ABNORMAL LOW (ref 13.0–17.0)
Hemoglobin: 8.8 g/dL — ABNORMAL LOW (ref 13.0–17.0)
Hemoglobin: 9.9 g/dL — ABNORMAL LOW (ref 13.0–17.0)
Hemoglobin: 7.5 g/dL — ABNORMAL LOW (ref 13.0–17.0)
Hemoglobin: 9.5 g/dL — ABNORMAL LOW (ref 13.0–17.0)
Hemoglobin: 10.2 g/dL — ABNORMAL LOW (ref 13.0–17.0)
Hemoglobin: 8.2 g/dL — ABNORMAL LOW (ref 13.0–17.0)
Hemoglobin: 7.1 g/dL — ABNORMAL LOW (ref 13.0–17.0)
O2 Saturation: 100 %
O2 Saturation: 100 %
O2 Saturation: 100 %
O2 Saturation: 100 %
O2 Saturation: 99 %
O2 Saturation: 99 %
O2 Saturation: 99 %
O2 Saturation: 98 %
O2 Saturation: 98 %
Patient temperature: 36.4
Patient temperature: 36
Patient temperature: 37.4
Patient temperature: 37.9
Potassium: 6.4 mmol/L (ref 3.5–5.1)
Potassium: 6.1 mmol/L — ABNORMAL HIGH (ref 3.5–5.1)
Potassium: 6.1 mmol/L — ABNORMAL HIGH (ref 3.5–5.1)
Potassium: 6.3 mmol/L (ref 3.5–5.1)
Potassium: 4.8 mmol/L (ref 3.5–5.1)
Potassium: 5 mmol/L (ref 3.5–5.1)
Potassium: 5.4 mmol/L — ABNORMAL HIGH (ref 3.5–5.1)
Potassium: 5.1 mmol/L (ref 3.5–5.1)
Potassium: 4.8 mmol/L (ref 3.5–5.1)
Sodium: 136 mmol/L (ref 135–145)
Sodium: 135 mmol/L (ref 135–145)
Sodium: 135 mmol/L (ref 135–145)
Sodium: 136 mmol/L (ref 135–145)
Sodium: 141 mmol/L (ref 135–145)
Sodium: 141 mmol/L (ref 135–145)
Sodium: 141 mmol/L (ref 135–145)
Sodium: 140 mmol/L (ref 135–145)
Sodium: 141 mmol/L (ref 135–145)
TCO2: 28 mmol/L (ref 22–32)
TCO2: 23 mmol/L (ref 22–32)
TCO2: 23 mmol/L (ref 22–32)
TCO2: 26 mmol/L (ref 22–32)
TCO2: 25 mmol/L (ref 22–32)
TCO2: 23 mmol/L (ref 22–32)
TCO2: 24 mmol/L (ref 22–32)
TCO2: 24 mmol/L (ref 22–32)
TCO2: 21 mmol/L — ABNORMAL LOW (ref 22–32)
pCO2 arterial: 42.9 mmHg (ref 32.0–48.0)
pCO2 arterial: 36.7 mmHg (ref 32.0–48.0)
pCO2 arterial: 37.5 mmHg (ref 32.0–48.0)
pCO2 arterial: 42 mmHg (ref 32.0–48.0)
pCO2 arterial: 53.9 mmHg — ABNORMAL HIGH (ref 32.0–48.0)
pCO2 arterial: 42.2 mmHg (ref 32.0–48.0)
pCO2 arterial: 46.8 mmHg (ref 32.0–48.0)
pCO2 arterial: 42.1 mmHg (ref 32.0–48.0)
pCO2 arterial: 37.3 mmHg (ref 32.0–48.0)
pH, Arterial: 7.404 (ref 7.350–7.450)
pH, Arterial: 7.373 (ref 7.350–7.450)
pH, Arterial: 7.377 (ref 7.350–7.450)
pH, Arterial: 7.385 (ref 7.350–7.450)
pH, Arterial: 7.241 — ABNORMAL LOW (ref 7.350–7.450)
pH, Arterial: 7.311 — ABNORMAL LOW (ref 7.350–7.450)
pH, Arterial: 7.291 — ABNORMAL LOW (ref 7.350–7.450)
pH, Arterial: 7.335 — ABNORMAL LOW (ref 7.350–7.450)
pH, Arterial: 7.344 — ABNORMAL LOW (ref 7.350–7.450)
pO2, Arterial: 279 mmHg — ABNORMAL HIGH (ref 83.0–108.0)
pO2, Arterial: 316 mmHg — ABNORMAL HIGH (ref 83.0–108.0)
pO2, Arterial: 317 mmHg — ABNORMAL HIGH (ref 83.0–108.0)
pO2, Arterial: 338 mmHg — ABNORMAL HIGH (ref 83.0–108.0)
pO2, Arterial: 168 mmHg — ABNORMAL HIGH (ref 83.0–108.0)
pO2, Arterial: 139 mmHg — ABNORMAL HIGH (ref 83.0–108.0)
pO2, Arterial: 175 mmHg — ABNORMAL HIGH (ref 83.0–108.0)
pO2, Arterial: 122 mmHg — ABNORMAL HIGH (ref 83.0–108.0)
pO2, Arterial: 108 mmHg (ref 83.0–108.0)

## 2020-01-08 LAB — BASIC METABOLIC PANEL
Anion gap: 8 (ref 5–15)
Anion gap: 7 (ref 5–15)
BUN: 22 mg/dL (ref 8–23)
BUN: 14 mg/dL (ref 8–23)
CO2: 27 mmol/L (ref 22–32)
CO2: 21 mmol/L — ABNORMAL LOW (ref 22–32)
Calcium: 9.2 mg/dL (ref 8.9–10.3)
Calcium: 8.5 mg/dL — ABNORMAL LOW (ref 8.9–10.3)
Chloride: 100 mmol/L (ref 98–111)
Chloride: 111 mmol/L (ref 98–111)
Creatinine, Ser: 1.15 mg/dL (ref 0.61–1.24)
Creatinine, Ser: 1.02 mg/dL (ref 0.61–1.24)
GFR, Estimated: >60 mL/min (ref >60)
GFR, Estimated: >60 mL/min (ref >60)
Glucose, Bld: 115 mg/dL — ABNORMAL HIGH (ref 70–99)
Glucose, Bld: 155 mg/dL — ABNORMAL HIGH (ref 70–99)
Potassium: 4.5 mmol/L (ref 3.5–5.1)
Potassium: 5.1 mmol/L (ref 3.5–5.1)
Sodium: 135 mmol/L (ref 135–145)
Sodium: 139 mmol/L (ref 135–145)

## 2020-01-08 LAB — POCT I-STAT, CHEM 8
BUN: 23 mg/dL (ref 8–23)
BUN: 19 mg/dL (ref 8–23)
BUN: 22 mg/dL (ref 8–23)
BUN: 17 mg/dL (ref 8–23)
BUN: 17 mg/dL (ref 8–23)
BUN: 18 mg/dL (ref 8–23)
BUN: 17 mg/dL (ref 8–23)
Calcium, Ion: 1.3 mmol/L (ref 1.15–1.40)
Calcium, Ion: 1.04 mmol/L — ABNORMAL LOW (ref 1.15–1.40)
Calcium, Ion: 1.3 mmol/L (ref 1.15–1.40)
Calcium, Ion: 0.97 mmol/L — ABNORMAL LOW (ref 1.15–1.40)
Calcium, Ion: 0.94 mmol/L — ABNORMAL LOW (ref 1.15–1.40)
Calcium, Ion: 1.04 mmol/L — ABNORMAL LOW (ref 1.15–1.40)
Calcium, Ion: 1.13 mmol/L — ABNORMAL LOW (ref 1.15–1.40)
Chloride: 102 mmol/L (ref 98–111)
Chloride: 100 mmol/L (ref 98–111)
Chloride: 100 mmol/L (ref 98–111)
Chloride: 103 mmol/L (ref 98–111)
Chloride: 103 mmol/L (ref 98–111)
Chloride: 106 mmol/L (ref 98–111)
Chloride: 106 mmol/L (ref 98–111)
Creatinine, Ser: 1.1 mg/dL (ref 0.61–1.24)
Creatinine, Ser: 1 mg/dL (ref 0.61–1.24)
Creatinine, Ser: 1.2 mg/dL (ref 0.61–1.24)
Creatinine, Ser: 0.8 mg/dL (ref 0.61–1.24)
Creatinine, Ser: 0.7 mg/dL (ref 0.61–1.24)
Creatinine, Ser: 0.9 mg/dL (ref 0.61–1.24)
Creatinine, Ser: 0.9 mg/dL (ref 0.61–1.24)
Glucose, Bld: 105 mg/dL — ABNORMAL HIGH (ref 70–99)
Glucose, Bld: 107 mg/dL — ABNORMAL HIGH (ref 70–99)
Glucose, Bld: 114 mg/dL — ABNORMAL HIGH (ref 70–99)
Glucose, Bld: 111 mg/dL — ABNORMAL HIGH (ref 70–99)
Glucose, Bld: 126 mg/dL — ABNORMAL HIGH (ref 70–99)
Glucose, Bld: 137 mg/dL — ABNORMAL HIGH (ref 70–99)
Glucose, Bld: 140 mg/dL — ABNORMAL HIGH (ref 70–99)
HCT: 45 % (ref 39.0–52.0)
HCT: 33 % — ABNORMAL LOW (ref 39.0–52.0)
HCT: 42 % (ref 39.0–52.0)
HCT: 26 % — ABNORMAL LOW (ref 39.0–52.0)
HCT: 25 % — ABNORMAL LOW (ref 39.0–52.0)
HCT: 28 % — ABNORMAL LOW (ref 39.0–52.0)
HCT: 21 % — ABNORMAL LOW (ref 39.0–52.0)
Hemoglobin: 15.3 g/dL (ref 13.0–17.0)
Hemoglobin: 11.2 g/dL — ABNORMAL LOW (ref 13.0–17.0)
Hemoglobin: 14.3 g/dL (ref 13.0–17.0)
Hemoglobin: 8.8 g/dL — ABNORMAL LOW (ref 13.0–17.0)
Hemoglobin: 8.5 g/dL — ABNORMAL LOW (ref 13.0–17.0)
Hemoglobin: 9.5 g/dL — ABNORMAL LOW (ref 13.0–17.0)
Hemoglobin: 7.1 g/dL — ABNORMAL LOW (ref 13.0–17.0)
Potassium: 5.2 mmol/L — ABNORMAL HIGH (ref 3.5–5.1)
Potassium: 6.5 mmol/L (ref 3.5–5.1)
Potassium: 6.7 mmol/L (ref 3.5–5.1)
Potassium: 6 mmol/L — ABNORMAL HIGH (ref 3.5–5.1)
Potassium: 5.6 mmol/L — ABNORMAL HIGH (ref 3.5–5.1)
Potassium: 6 mmol/L — ABNORMAL HIGH (ref 3.5–5.1)
Potassium: 4.8 mmol/L (ref 3.5–5.1)
Sodium: 136 mmol/L (ref 135–145)
Sodium: 135 mmol/L (ref 135–145)
Sodium: 135 mmol/L (ref 135–145)
Sodium: 133 mmol/L — ABNORMAL LOW (ref 135–145)
Sodium: 136 mmol/L (ref 135–145)
Sodium: 137 mmol/L (ref 135–145)
Sodium: 141 mmol/L (ref 135–145)
TCO2: 26 mmol/L (ref 22–32)
TCO2: 26 mmol/L (ref 22–32)
TCO2: 27 mmol/L (ref 22–32)
TCO2: 24 mmol/L (ref 22–32)
TCO2: 22 mmol/L (ref 22–32)
TCO2: 23 mmol/L (ref 22–32)
TCO2: 22 mmol/L (ref 22–32)

## 2020-01-08 LAB — LACTATE DEHYDROGENASE: LDH: 240 U/L — ABNORMAL HIGH (ref 98–192)

## 2020-01-08 LAB — COOXEMETRY PANEL
Carboxyhemoglobin: 1 % (ref 0.5–1.5)
Methemoglobin: 0.7 % (ref 0.0–1.5)
O2 Saturation: 56.8 %
Total hemoglobin: 14.2 g/dL (ref 12.0–16.0)

## 2020-01-08 LAB — CBC
HCT: 43.9 % (ref 39.0–52.0)
HCT: 30.7 % — ABNORMAL LOW (ref 39.0–52.0)
HCT: 25.8 % — ABNORMAL LOW (ref 39.0–52.0)
Hemoglobin: 14.1 g/dL (ref 13.0–17.0)
Hemoglobin: 9.5 g/dL — ABNORMAL LOW (ref 13.0–17.0)
Hemoglobin: 8.1 g/dL — ABNORMAL LOW (ref 13.0–17.0)
MCH: 28.7 pg (ref 26.0–34.0)
MCH: 28.5 pg (ref 26.0–34.0)
MCH: 28.8 pg (ref 26.0–34.0)
MCHC: 32.1 g/dL (ref 30.0–36.0)
MCHC: 30.9 g/dL (ref 30.0–36.0)
MCHC: 31.4 g/dL (ref 30.0–36.0)
MCV: 89.4 fL (ref 80.0–100.0)
MCV: 92.2 fL (ref 80.0–100.0)
MCV: 91.8 fL (ref 80.0–100.0)
Platelets: 256 10*3/uL (ref 150–400)
Platelets: 117 10*3/uL — ABNORMAL LOW (ref 150–400)
Platelets: 118 10*3/uL — ABNORMAL LOW (ref 150–400)
RBC: 4.91 MIL/uL (ref 4.22–5.81)
RBC: 3.33 MIL/uL — ABNORMAL LOW (ref 4.22–5.81)
RBC: 2.81 MIL/uL — ABNORMAL LOW (ref 4.22–5.81)
RDW: 14.5 % (ref 11.5–15.5)
RDW: 14.1 % (ref 11.5–15.5)
RDW: 14.4 % (ref 11.5–15.5)
WBC: 11.2 10*3/uL — ABNORMAL HIGH (ref 4.0–10.5)
WBC: 17.4 10*3/uL — ABNORMAL HIGH (ref 4.0–10.5)
WBC: 12.4 10*3/uL — ABNORMAL HIGH (ref 4.0–10.5)
nRBC: 0 % (ref 0.0–0.2)
nRBC: 0 % (ref 0.0–0.2)
nRBC: 0 % (ref 0.0–0.2)

## 2020-01-08 LAB — ECHO INTRAOPERATIVE TEE
Calc EF: 17.9 %
Height: 70.5 in
S' Lateral: 5 cm
Single Plane A2C EF: 24.7 %
Single Plane A4C EF: 15.2 %
Weight: 1968.27 oz

## 2020-01-08 LAB — PROTIME-INR
INR: 1.5 — ABNORMAL HIGH (ref 0.8–1.2)
Prothrombin Time: 18 seconds — ABNORMAL HIGH (ref 11.4–15.2)

## 2020-01-08 LAB — GLUCOSE, CAPILLARY
Glucose-Capillary: 122 mg/dL — ABNORMAL HIGH (ref 70–99)
Glucose-Capillary: 131 mg/dL — ABNORMAL HIGH (ref 70–99)
Glucose-Capillary: 132 mg/dL — ABNORMAL HIGH (ref 70–99)
Glucose-Capillary: 141 mg/dL — ABNORMAL HIGH (ref 70–99)
Glucose-Capillary: 132 mg/dL — ABNORMAL HIGH (ref 70–99)
Glucose-Capillary: 165 mg/dL — ABNORMAL HIGH (ref 70–99)
Glucose-Capillary: 149 mg/dL — ABNORMAL HIGH (ref 70–99)
Glucose-Capillary: 134 mg/dL — ABNORMAL HIGH (ref 70–99)

## 2020-01-08 LAB — FIBRINOGEN: Fibrinogen: 205 mg/dL — ABNORMAL LOW (ref 210–475)

## 2020-01-08 LAB — POCT I-STAT EG7
Acid-Base Excess: 0 mmol/L (ref 0.0–2.0)
Bicarbonate: 25.4 mmol/L (ref 20.0–28.0)
Calcium, Ion: 0.97 mmol/L — ABNORMAL LOW (ref 1.15–1.40)
HCT: 33 % — ABNORMAL LOW (ref 39.0–52.0)
Hemoglobin: 11.2 g/dL — ABNORMAL LOW (ref 13.0–17.0)
O2 Saturation: 72 %
Potassium: 6.2 mmol/L — ABNORMAL HIGH (ref 3.5–5.1)
Sodium: 137 mmol/L (ref 135–145)
TCO2: 27 mmol/L (ref 22–32)
pCO2, Ven: 46 mmHg (ref 44.0–60.0)
pH, Ven: 7.35 (ref 7.250–7.430)
pO2, Ven: 40 mmHg (ref 32.0–45.0)

## 2020-01-08 LAB — HEMOGLOBIN AND HEMATOCRIT, BLOOD
HCT: 25.9 % — ABNORMAL LOW (ref 39.0–52.0)
Hemoglobin: 8.5 g/dL — ABNORMAL LOW (ref 13.0–17.0)

## 2020-01-08 LAB — APTT: aPTT: 35 seconds (ref 24–36)

## 2020-01-08 LAB — POTASSIUM
Potassium: 6.8 mmol/L (ref 3.5–5.1)
Potassium: 6.1 mmol/L — ABNORMAL HIGH (ref 3.5–5.1)

## 2020-01-08 LAB — MAGNESIUM: Magnesium: 2.6 mg/dL — ABNORMAL HIGH (ref 1.7–2.4)

## 2020-01-08 LAB — HEPARIN LEVEL (UNFRACTIONATED): Heparin Unfractionated: 0.29 IU/mL — ABNORMAL LOW (ref 0.30–0.70)

## 2020-01-08 LAB — PLATELET COUNT: Platelets: 168 10*3/uL (ref 150–400)

## 2020-01-08 LAB — PREPARE RBC (CROSSMATCH)

## 2020-01-08 SURGERY — CORONARY ARTERY BYPASS GRAFTING (CABG)
Anesthesia: General | Site: Chest

## 2020-01-08 MED ORDER — PHENYLEPHRINE 40 MCG/ML (10ML) SYRINGE FOR IV PUSH (FOR BLOOD PRESSURE SUPPORT)
PREFILLED_SYRINGE | INTRAVENOUS | Status: AC
  Filled 2020-01-08: qty 10

## 2020-01-08 MED ORDER — LACTATED RINGERS IV SOLN: INTRAVENOUS | Status: DC

## 2020-01-08 MED ORDER — STERILE WATER FOR IRRIGATION IR SOLN
Status: DC | PRN
  Administered 2020-01-08: 2000 mL

## 2020-01-08 MED ORDER — DIPHENHYDRAMINE HCL 50 MG/ML IJ SOLN
INTRAMUSCULAR | Status: DC | PRN
  Administered 2020-01-08: 25 mg via INTRAVENOUS

## 2020-01-08 MED ORDER — METOPROLOL TARTRATE 5 MG/5ML IV SOLN
2.5000 mg | INTRAVENOUS | Status: DC | PRN
  Administered 2020-01-08: 2.5 mg via INTRAVENOUS
  Filled 2020-01-08: qty 5

## 2020-01-08 MED ORDER — HEMOSTATIC AGENTS (NO CHARGE) OPTIME
TOPICAL | Status: DC | PRN
  Administered 2020-01-08 (×5): 1 via TOPICAL

## 2020-01-08 MED ORDER — DEXTROSE 50 % IV SOLN: 0.0000 mL | INTRAVENOUS | Status: DC | PRN

## 2020-01-08 MED ORDER — PHENYLEPHRINE HCL-NACL 20-0.9 MG/250ML-% IV SOLN
0.0000 ug/min | INTRAVENOUS | Status: DC
  Filled 2020-01-08: qty 250

## 2020-01-08 MED ORDER — SODIUM CHLORIDE 0.9 % IV SOLN: INTRAVENOUS | Status: DC | PRN

## 2020-01-08 MED ORDER — NITROGLYCERIN IN D5W 200-5 MCG/ML-% IV SOLN: 0.0000 ug/min | INTRAVENOUS | Status: DC

## 2020-01-08 MED ORDER — PHENYLEPHRINE 40 MCG/ML (10ML) SYRINGE FOR IV PUSH (FOR BLOOD PRESSURE SUPPORT)
PREFILLED_SYRINGE | INTRAVENOUS | Status: DC | PRN
  Administered 2020-01-08 (×3): 40 ug via INTRAVENOUS

## 2020-01-08 MED ORDER — PANTOPRAZOLE SODIUM 40 MG PO TBEC
40.0000 mg | DELAYED_RELEASE_TABLET | Freq: Every day | ORAL | Status: DC
  Administered 2020-01-10: 40 mg via ORAL
  Filled 2020-01-08: qty 1

## 2020-01-08 MED ORDER — LACTATED RINGERS IV SOLN: INTRAVENOUS | Status: DC | PRN

## 2020-01-08 MED ORDER — VANCOMYCIN HCL 1000 MG IV SOLR
INTRAVENOUS | Status: AC
  Filled 2020-01-08: qty 1000

## 2020-01-08 MED ORDER — SODIUM CHLORIDE 0.9 % IV SOLN
INTRAVENOUS | Status: DC | PRN
  Administered 2020-01-08: 20 ug via INTRAVENOUS

## 2020-01-08 MED ORDER — HEPARIN SODIUM (PORCINE) 1000 UNIT/ML IJ SOLN
INTRAMUSCULAR | Status: AC
  Filled 2020-01-08: qty 1

## 2020-01-08 MED ORDER — ROCURONIUM BROMIDE 10 MG/ML (PF) SYRINGE
PREFILLED_SYRINGE | INTRAVENOUS | Status: AC
  Filled 2020-01-08: qty 10

## 2020-01-08 MED ORDER — CHLORHEXIDINE GLUCONATE 0.12% ORAL RINSE (MEDLINE KIT)
15.0000 mL | Freq: Two times a day (BID) | OROMUCOSAL | Status: DC
  Administered 2020-01-08 – 2020-01-10 (×5): 15 mL via OROMUCOSAL

## 2020-01-08 MED ORDER — DEXMEDETOMIDINE HCL IN NACL 400 MCG/100ML IV SOLN
0.0000 ug/kg/h | INTRAVENOUS | Status: DC
  Administered 2020-01-09: 0.2 ug/kg/h via INTRAVENOUS
  Filled 2020-01-08: qty 100

## 2020-01-08 MED ORDER — BISACODYL 10 MG RE SUPP
10.0000 mg | Freq: Every day | RECTAL | Status: DC
  Administered 2020-01-13: 10 mg via RECTAL
  Filled 2020-01-08 (×2): qty 1

## 2020-01-08 MED ORDER — VANCOMYCIN HCL IN DEXTROSE 1-5 GM/200ML-% IV SOLN
1000.0000 mg | Freq: Once | INTRAVENOUS | Status: AC
  Administered 2020-01-08: 1000 mg via INTRAVENOUS
  Filled 2020-01-08: qty 200

## 2020-01-08 MED ORDER — DOCUSATE SODIUM 100 MG PO CAPS
200.0000 mg | ORAL_CAPSULE | Freq: Every day | ORAL | Status: DC
  Administered 2020-01-09 – 2020-01-12 (×3): 200 mg via ORAL
  Filled 2020-01-08 (×3): qty 2

## 2020-01-08 MED ORDER — ALBUMIN HUMAN 5 % IV SOLN
250.0000 mL | INTRAVENOUS | Status: AC | PRN
  Administered 2020-01-08 (×4): 12.5 g via INTRAVENOUS
  Filled 2020-01-08 (×3): qty 250

## 2020-01-08 MED ORDER — LACTATED RINGERS IV SOLN: 500.0000 mL | Freq: Once | INTRAVENOUS | Status: DC | PRN

## 2020-01-08 MED ORDER — POTASSIUM CHLORIDE 10 MEQ/50ML IV SOLN
10.0000 meq | INTRAVENOUS | Status: AC
  Filled 2020-01-08: qty 50

## 2020-01-08 MED ORDER — PROTAMINE SULFATE 10 MG/ML IV SOLN
INTRAVENOUS | Status: AC
  Filled 2020-01-08: qty 25

## 2020-01-08 MED ORDER — MIDAZOLAM HCL (PF) 10 MG/2ML IJ SOLN
INTRAMUSCULAR | Status: AC
  Filled 2020-01-08: qty 2

## 2020-01-08 MED ORDER — ACETAMINOPHEN 160 MG/5ML PO SOLN: 1000.0000 mg | Freq: Four times a day (QID) | ORAL | Status: DC

## 2020-01-08 MED ORDER — SODIUM CHLORIDE 0.9% FLUSH
3.0000 mL | Freq: Two times a day (BID) | INTRAVENOUS | Status: DC
  Administered 2020-01-10 – 2020-01-13 (×3): 3 mL via INTRAVENOUS

## 2020-01-08 MED ORDER — DEXTROSE 5 % SOLN FOR IMPELLA PURGE CATHETER: INTRAVENOUS | Status: DC

## 2020-01-08 MED ORDER — LIDOCAINE 2% (20 MG/ML) 5 ML SYRINGE
INTRAMUSCULAR | Status: AC
  Filled 2020-01-08: qty 5

## 2020-01-08 MED ORDER — METOCLOPRAMIDE HCL 5 MG/ML IJ SOLN
10.0000 mg | Freq: Four times a day (QID) | INTRAMUSCULAR | Status: AC
  Administered 2020-01-08 – 2020-01-09 (×3): 10 mg via INTRAVENOUS
  Filled 2020-01-08 (×4): qty 2

## 2020-01-08 MED ORDER — VASOPRESSIN 20 UNITS/100 ML INFUSION FOR SHOCK
0.0000 [IU]/min | INTRAVENOUS | Status: AC
  Administered 2020-01-08: .03 [IU]/min via INTRAVENOUS
  Filled 2020-01-08: qty 100

## 2020-01-08 MED ORDER — EPINEPHRINE HCL 5 MG/250ML IV SOLN IN NS
0.5000 ug/min | INTRAVENOUS | Status: DC
  Administered 2020-01-10: 2.5 ug/min via INTRAVENOUS
  Administered 2020-01-10: 5 ug/min via INTRAVENOUS
  Administered 2020-01-11: 10 ug/min via INTRAVENOUS
  Administered 2020-01-11: 07:00:00 30 ug/min via INTRAVENOUS
  Administered 2020-01-11: 10 ug/min via INTRAVENOUS
  Administered 2020-01-11: 09:00:00 40 ug/min via INTRAVENOUS
  Administered 2020-01-12 – 2020-01-13 (×4): 10 ug/min via INTRAVENOUS
  Administered 2020-01-13: 16 ug/min via INTRAVENOUS
  Administered 2020-01-14: 06:00:00 23 ug/min via INTRAVENOUS
  Administered 2020-01-14: 15 ug/min via INTRAVENOUS
  Filled 2020-01-08 (×15): qty 250

## 2020-01-08 MED ORDER — CALCIUM CHLORIDE 10 % IV SOLN
INTRAVENOUS | Status: DC | PRN
  Administered 2020-01-08 (×2): 200 mg via INTRAVENOUS
  Administered 2020-01-08 (×2): 500 mg via INTRAVENOUS

## 2020-01-08 MED ORDER — CALCIUM CHLORIDE 10 % IV SOLN
INTRAVENOUS | Status: AC
  Filled 2020-01-08: qty 20

## 2020-01-08 MED ORDER — ASPIRIN 81 MG PO CHEW
324.0000 mg | CHEWABLE_TABLET | Freq: Every day | ORAL | Status: DC
  Administered 2020-01-10 – 2020-01-12 (×2): 324 mg
  Filled 2020-01-08 (×3): qty 4

## 2020-01-08 MED ORDER — HEPARIN SODIUM (PORCINE) 1000 UNIT/ML IJ SOLN
INTRAMUSCULAR | Status: DC | PRN
  Administered 2020-01-08: 2000 [IU] via INTRAVENOUS
  Administered 2020-01-08: 17000 [IU] via INTRAVENOUS
  Administered 2020-01-08: 2000 [IU] via INTRAVENOUS

## 2020-01-08 MED ORDER — HEPARIN SODIUM (PORCINE) 5000 UNIT/ML IJ SOLN
50000.0000 [IU] | INTRAVENOUS | Status: DC
  Administered 2020-01-08: 50000 [IU]
  Filled 2020-01-08 (×2): qty 10

## 2020-01-08 MED ORDER — METOPROLOL TARTRATE 12.5 MG HALF TABLET: 12.5000 mg | ORAL_TABLET | Freq: Two times a day (BID) | ORAL | Status: DC

## 2020-01-08 MED ORDER — SODIUM CHLORIDE 0.45 % IV SOLN: INTRAVENOUS | Status: DC | PRN

## 2020-01-08 MED ORDER — MORPHINE SULFATE (PF) 2 MG/ML IV SOLN
1.0000 mg | INTRAVENOUS | Status: DC | PRN
  Administered 2020-01-08 (×2): 2 mg via INTRAVENOUS
  Administered 2020-01-08: 4 mg via INTRAVENOUS
  Administered 2020-01-08 (×2): 2 mg via INTRAVENOUS
  Administered 2020-01-09: 4 mg via INTRAVENOUS
  Administered 2020-01-09: 2 mg via INTRAVENOUS
  Administered 2020-01-09 (×2): 4 mg via INTRAVENOUS
  Administered 2020-01-09: 2 mg via INTRAVENOUS
  Administered 2020-01-09 – 2020-01-10 (×3): 4 mg via INTRAVENOUS
  Filled 2020-01-08 (×9): qty 2

## 2020-01-08 MED ORDER — DIPHENHYDRAMINE HCL 50 MG/ML IJ SOLN
INTRAMUSCULAR | Status: AC
  Filled 2020-01-08: qty 1

## 2020-01-08 MED ORDER — ONDANSETRON HCL 4 MG/2ML IJ SOLN
4.0000 mg | Freq: Four times a day (QID) | INTRAMUSCULAR | Status: DC | PRN
  Administered 2020-01-08: 4 mg via INTRAVENOUS
  Filled 2020-01-08: qty 2

## 2020-01-08 MED ORDER — EPHEDRINE 5 MG/ML INJ
INTRAVENOUS | Status: AC
  Filled 2020-01-08: qty 10

## 2020-01-08 MED ORDER — SODIUM CHLORIDE 0.9 % IV SOLN: INTRAVENOUS | Status: DC

## 2020-01-08 MED ORDER — CHLORHEXIDINE GLUCONATE 0.12 % MT SOLN
15.0000 mL | OROMUCOSAL | Status: AC
  Administered 2020-01-08: 15 mL via OROMUCOSAL

## 2020-01-08 MED ORDER — ASPIRIN EC 325 MG PO TBEC
325.0000 mg | DELAYED_RELEASE_TABLET | Freq: Every day | ORAL | Status: DC
  Filled 2020-01-08 (×2): qty 1

## 2020-01-08 MED ORDER — SODIUM CHLORIDE 0.9% IV SOLUTION: Freq: Once | INTRAVENOUS | Status: DC

## 2020-01-08 MED ORDER — SUCCINYLCHOLINE CHLORIDE 200 MG/10ML IV SOSY
PREFILLED_SYRINGE | INTRAVENOUS | Status: AC
  Filled 2020-01-08: qty 10

## 2020-01-08 MED ORDER — MILRINONE LACTATE IN DEXTROSE 20-5 MG/100ML-% IV SOLN
0.3000 ug/kg/min | INTRAVENOUS | Status: DC
  Administered 2020-01-08: 0.3 ug/kg/min via INTRAVENOUS
  Administered 2020-01-09: 0.25 ug/kg/min via INTRAVENOUS
  Administered 2020-01-10: 0.3 ug/kg/min via INTRAVENOUS
  Filled 2020-01-08 (×2): qty 100

## 2020-01-08 MED ORDER — SODIUM CHLORIDE (PF) 0.9 % IJ SOLN
INTRAMUSCULAR | Status: AC
  Filled 2020-01-08: qty 10

## 2020-01-08 MED ORDER — 0.9 % SODIUM CHLORIDE (POUR BTL) OPTIME
TOPICAL | Status: DC | PRN
  Administered 2020-01-08: 5000 mL

## 2020-01-08 MED ORDER — BISACODYL 5 MG PO TBEC
10.0000 mg | DELAYED_RELEASE_TABLET | Freq: Every day | ORAL | Status: DC
  Administered 2020-01-09 – 2020-01-10 (×2): 10 mg via ORAL
  Filled 2020-01-08 (×2): qty 2

## 2020-01-08 MED ORDER — NOREPINEPHRINE 4 MG/250ML-% IV SOLN
0.0000 ug/min | INTRAVENOUS | Status: DC
  Administered 2020-01-09 (×2): 8 ug/min via INTRAVENOUS
  Filled 2020-01-08 (×3): qty 250

## 2020-01-08 MED ORDER — MAGNESIUM SULFATE 4 GM/100ML IV SOLN
4.0000 g | Freq: Once | INTRAVENOUS | Status: AC
  Administered 2020-01-08: 4 g via INTRAVENOUS
  Filled 2020-01-08: qty 100

## 2020-01-08 MED ORDER — METOPROLOL TARTRATE 25 MG/10 ML ORAL SUSPENSION: 12.5000 mg | Freq: Two times a day (BID) | ORAL | Status: DC

## 2020-01-08 MED ORDER — ACETAMINOPHEN 650 MG RE SUPP
650.0000 mg | Freq: Once | RECTAL | Status: AC
  Administered 2020-01-08: 650 mg via RECTAL

## 2020-01-08 MED ORDER — PLASMA-LYTE 148 IV SOLN
INTRAVENOUS | Status: DC | PRN
  Administered 2020-01-08: 500 mL via INTRAVASCULAR

## 2020-01-08 MED ORDER — PROPOFOL 10 MG/ML IV BOLUS
INTRAVENOUS | Status: AC
  Filled 2020-01-08: qty 20

## 2020-01-08 MED ORDER — INSULIN REGULAR(HUMAN) IN NACL 100-0.9 UT/100ML-% IV SOLN: INTRAVENOUS | Status: DC

## 2020-01-08 MED ORDER — MIDAZOLAM HCL 5 MG/5ML IJ SOLN
INTRAMUSCULAR | Status: DC | PRN
  Administered 2020-01-08 (×2): 2 mg via INTRAVENOUS
  Administered 2020-01-08 (×2): 1 mg via INTRAVENOUS
  Administered 2020-01-08: 2 mg via INTRAVENOUS
  Administered 2020-01-08 (×2): 1 mg via INTRAVENOUS

## 2020-01-08 MED ORDER — CHLORHEXIDINE GLUCONATE CLOTH 2 % EX PADS
6.0000 | MEDICATED_PAD | Freq: Every day | CUTANEOUS | Status: DC
  Administered 2020-01-08 – 2020-01-13 (×5): 6 via TOPICAL

## 2020-01-08 MED ORDER — ALBUMIN HUMAN 5 % IV SOLN: INTRAVENOUS | Status: DC | PRN

## 2020-01-08 MED ORDER — OXYCODONE HCL 5 MG PO TABS
5.0000 mg | ORAL_TABLET | ORAL | Status: DC | PRN
  Administered 2020-01-08 – 2020-01-10 (×5): 10 mg via ORAL
  Filled 2020-01-08 (×5): qty 2

## 2020-01-08 MED ORDER — VASOPRESSIN 20 UNITS/100 ML INFUSION FOR SHOCK
0.0000 [IU]/min | INTRAVENOUS | Status: DC
  Administered 2020-01-09 – 2020-01-11 (×6): 0.03 [IU]/min via INTRAVENOUS
  Administered 2020-01-12 (×2): 0.02 [IU]/min via INTRAVENOUS
  Administered 2020-01-13 (×2): 0.04 [IU]/min via INTRAVENOUS
  Filled 2020-01-08 (×10): qty 100

## 2020-01-08 MED ORDER — FENTANYL CITRATE (PF) 250 MCG/5ML IJ SOLN
INTRAMUSCULAR | Status: DC | PRN
  Administered 2020-01-08 (×4): 100 ug via INTRAVENOUS
  Administered 2020-01-08: 25 ug via INTRAVENOUS
  Administered 2020-01-08: 100 ug via INTRAVENOUS
  Administered 2020-01-08: 175 ug via INTRAVENOUS
  Administered 2020-01-08: 50 ug via INTRAVENOUS
  Administered 2020-01-08: 100 ug via INTRAVENOUS

## 2020-01-08 MED ORDER — SODIUM CHLORIDE (PF) 0.9 % IJ SOLN
OROMUCOSAL | Status: DC | PRN
  Administered 2020-01-08 (×3): 4 mL via TOPICAL

## 2020-01-08 MED ORDER — NITROGLYCERIN IN D5W 200-5 MCG/ML-% IV SOLN
0.0000 ug/min | INTRAVENOUS | Status: DC
  Filled 2020-01-08: qty 250

## 2020-01-08 MED ORDER — SODIUM CHLORIDE 0.9 % IV SOLN
1.5000 g | Freq: Two times a day (BID) | INTRAVENOUS | Status: DC
  Filled 2020-01-08 (×2): qty 1.5

## 2020-01-08 MED ORDER — SODIUM CHLORIDE 0.9% FLUSH: 3.0000 mL | INTRAVENOUS | Status: DC | PRN

## 2020-01-08 MED ORDER — SODIUM CHLORIDE 0.9 % IV SOLN
20.0000 ug | Freq: Once | INTRAVENOUS | Status: DC
  Filled 2020-01-08: qty 5

## 2020-01-08 MED ORDER — PROTAMINE SULFATE 10 MG/ML IV SOLN
INTRAVENOUS | Status: DC | PRN
  Administered 2020-01-08: 210 mg via INTRAVENOUS

## 2020-01-08 MED ORDER — ETOMIDATE 2 MG/ML IV SOLN
INTRAVENOUS | Status: AC
  Filled 2020-01-08: qty 10

## 2020-01-08 MED ORDER — ACETAMINOPHEN 500 MG PO TABS
1000.0000 mg | ORAL_TABLET | Freq: Four times a day (QID) | ORAL | Status: DC
  Administered 2020-01-08 – 2020-01-10 (×6): 1000 mg via ORAL
  Filled 2020-01-08 (×8): qty 2

## 2020-01-08 MED ORDER — SODIUM CHLORIDE 0.9 % IV SOLN: 250.0000 mL | INTRAVENOUS | Status: DC

## 2020-01-08 MED ORDER — ORAL CARE MOUTH RINSE
15.0000 mL | OROMUCOSAL | Status: DC
  Administered 2020-01-08 – 2020-01-09 (×3): 15 mL via OROMUCOSAL

## 2020-01-08 MED ORDER — MIDAZOLAM HCL 2 MG/2ML IJ SOLN: 2.0000 mg | INTRAMUSCULAR | Status: DC | PRN

## 2020-01-08 MED ORDER — FENTANYL CITRATE (PF) 250 MCG/5ML IJ SOLN
INTRAMUSCULAR | Status: AC
Start: 2020-01-08 — End: ?
  Filled 2020-01-08: qty 25

## 2020-01-08 MED ORDER — ROCURONIUM BROMIDE 10 MG/ML (PF) SYRINGE
PREFILLED_SYRINGE | INTRAVENOUS | Status: DC | PRN
  Administered 2020-01-08: 20 mg via INTRAVENOUS
  Administered 2020-01-08: 60 mg via INTRAVENOUS
  Administered 2020-01-08: 50 mg via INTRAVENOUS
  Administered 2020-01-08: 40 mg via INTRAVENOUS
  Administered 2020-01-08: 30 mg via INTRAVENOUS
  Administered 2020-01-08: 50 mg via INTRAVENOUS

## 2020-01-08 MED ORDER — FAMOTIDINE IN NACL 20-0.9 MG/50ML-% IV SOLN
20.0000 mg | Freq: Two times a day (BID) | INTRAVENOUS | Status: AC
  Administered 2020-01-08: 20 mg via INTRAVENOUS

## 2020-01-08 MED ORDER — ACETAMINOPHEN 160 MG/5ML PO SOLN: 650.0000 mg | Freq: Once | ORAL | Status: AC

## 2020-01-08 MED ORDER — PROPOFOL 10 MG/ML IV BOLUS
INTRAVENOUS | Status: DC | PRN
  Administered 2020-01-08: 60 mg via INTRAVENOUS

## 2020-01-08 MED ORDER — TRAMADOL HCL 50 MG PO TABS
50.0000 mg | ORAL_TABLET | ORAL | Status: DC | PRN
  Administered 2020-01-09: 100 mg via ORAL
  Filled 2020-01-08: qty 2

## 2020-01-08 MED FILL — Magnesium Sulfate Inj 50%: INTRAMUSCULAR | Qty: 10 | Status: AC

## 2020-01-08 MED FILL — Heparin Sodium (Porcine) Inj 1000 Unit/ML: INTRAMUSCULAR | Qty: 2500 | Status: CN

## 2020-01-08 MED FILL — Heparin Sodium (Porcine) Inj 1000 Unit/ML: INTRAMUSCULAR | Qty: 30 | Status: AC

## 2020-01-08 MED FILL — Potassium Chloride Inj 2 mEq/ML: INTRAVENOUS | Qty: 40 | Status: AC

## 2020-01-08 SURGICAL SUPPLY — 124 items
ADAPTER CARDIO PERF ANTE/RETRO (ADAPTER) ×5 IMPLANT
ANCHOR CATH FOLEY SECURE (MISCELLANEOUS) ×15 IMPLANT
APPLICATOR TIP COSEAL (VASCULAR PRODUCTS) IMPLANT
BAG DECANTER FOR FLEXI CONT (MISCELLANEOUS) ×5 IMPLANT
BLADE CLIPPER SURG (BLADE) IMPLANT
BLADE STERNUM SYSTEM 6 (BLADE) ×5 IMPLANT
BLADE SURG 11 STRL SS (BLADE) ×5 IMPLANT
BLADE SURG 12 STRL SS (BLADE) ×5 IMPLANT
BNDG ELASTIC 4X5.8 VLCR STR LF (GAUZE/BANDAGES/DRESSINGS) ×5 IMPLANT
BNDG ELASTIC 6X5.8 VLCR STR LF (GAUZE/BANDAGES/DRESSINGS) ×5 IMPLANT
BNDG GAUZE ELAST 4 BULKY (GAUZE/BANDAGES/DRESSINGS) ×5 IMPLANT
CANISTER SUCT 3000ML PPV (MISCELLANEOUS) ×5 IMPLANT
CANNULA GUNDRY RCSP 15FR (MISCELLANEOUS) ×5 IMPLANT
CATH ANGIO 5F PIGTAIL 65CM (CATHETERS) ×5 IMPLANT
CATH CPB KIT VANTRIGT (MISCELLANEOUS) ×5 IMPLANT
CATH DIAG EXPO 6F AL1 (CATHETERS) ×5 IMPLANT
CATH INFINITI 6F MPB2 (CATHETERS) ×5 IMPLANT
CATH ROBINSON RED A/P 18FR (CATHETERS) ×15 IMPLANT
CATH THORACIC 28FR RT ANG (CATHETERS) ×10 IMPLANT
DERMABOND ADVANCED (GAUZE/BANDAGES/DRESSINGS) ×2
DERMABOND ADVANCED .7 DNX12 (GAUZE/BANDAGES/DRESSINGS) ×3 IMPLANT
DRAIN CHANNEL 32F RND 10.7 FF (WOUND CARE) ×5 IMPLANT
DRAPE C-ARM 42X72 X-RAY (DRAPES) ×5 IMPLANT
DRAPE CARDIOVASCULAR INCISE (DRAPES) ×5
DRAPE CV SPLIT W-CLR ANES SCRN (DRAPES) IMPLANT
DRAPE PERI GROIN 82X75IN TIB (DRAPES) IMPLANT
DRAPE SLUSH/WARMER DISC (DRAPES) ×5 IMPLANT
DRAPE SRG 135X102X78XABS (DRAPES) ×3 IMPLANT
DRSG AQUACEL AG ADV 3.5X14 (GAUZE/BANDAGES/DRESSINGS) ×5 IMPLANT
ELECT BLADE 4.0 EZ CLEAN MEGAD (MISCELLANEOUS) ×5
ELECT BLADE 6.5 EXT (BLADE) ×5 IMPLANT
ELECT CAUTERY BLADE 6.4 (BLADE) ×5 IMPLANT
ELECT REM PT RETURN 9FT ADLT (ELECTROSURGICAL) ×10
ELECTRODE BLDE 4.0 EZ CLN MEGD (MISCELLANEOUS) ×3 IMPLANT
ELECTRODE REM PT RTRN 9FT ADLT (ELECTROSURGICAL) ×6 IMPLANT
FELT TEFLON 1X6 (MISCELLANEOUS) ×5 IMPLANT
GAUZE SPONGE 4X4 12PLY STRL (GAUZE/BANDAGES/DRESSINGS) ×10 IMPLANT
GAUZE SPONGE 4X4 12PLY STRL LF (GAUZE/BANDAGES/DRESSINGS) ×10 IMPLANT
GAUZE XEROFORM 1X8 LF (GAUZE/BANDAGES/DRESSINGS) ×5 IMPLANT
GLOVE BIO SURGEON STRL SZ 6 (GLOVE) ×25 IMPLANT
GLOVE BIO SURGEON STRL SZ 6.5 (GLOVE) ×28 IMPLANT
GLOVE BIO SURGEON STRL SZ7.5 (GLOVE) ×15 IMPLANT
GLOVE BIO SURGEONS STRL SZ 6.5 (GLOVE) ×7
GLOVE BIOGEL M STRL SZ7.5 (GLOVE) ×15 IMPLANT
GLOVE BIOGEL PI IND STRL 6 (GLOVE) ×6 IMPLANT
GLOVE BIOGEL PI IND STRL 8.5 (GLOVE) ×3 IMPLANT
GLOVE BIOGEL PI INDICATOR 6 (GLOVE) ×4
GLOVE BIOGEL PI INDICATOR 8.5 (GLOVE) ×2
GOWN STRL REUS W/ TWL LRG LVL3 (GOWN DISPOSABLE) ×24 IMPLANT
GOWN STRL REUS W/TWL LRG LVL3 (GOWN DISPOSABLE) ×40
GRAFT GELWEAVE IMPREG 10X30CM (Prosthesis & Implant Heart) ×5 IMPLANT
HEMOSTAT POWDER SURGIFOAM 1G (HEMOSTASIS) ×15 IMPLANT
HEMOSTAT SURGICEL 2X14 (HEMOSTASIS) ×5 IMPLANT
INSERT FOGARTY SM (MISCELLANEOUS) ×10 IMPLANT
INSERT FOGARTY XLG (MISCELLANEOUS) IMPLANT
KIT BASIN OR (CUSTOM PROCEDURE TRAY) ×5 IMPLANT
KIT SUCTION CATH 14FR (SUCTIONS) ×5 IMPLANT
KIT TURNOVER KIT B (KITS) ×5 IMPLANT
KIT VASOVIEW HEMOPRO 2 VH 4000 (KITS) ×5 IMPLANT
LEAD PACING MYOCARDI (MISCELLANEOUS) ×5 IMPLANT
LOOP VESSEL MINI RED (MISCELLANEOUS) IMPLANT
MARKER GRAFT CORONARY BYPASS (MISCELLANEOUS) ×15 IMPLANT
NS IRRIG 1000ML POUR BTL (IV SOLUTION) ×30 IMPLANT
PACK CHEST (CUSTOM PROCEDURE TRAY) ×5 IMPLANT
PACK E OPEN HEART (SUTURE) ×5 IMPLANT
PACK OPEN HEART (CUSTOM PROCEDURE TRAY) ×5 IMPLANT
PAD ARMBOARD 7.5X6 YLW CONV (MISCELLANEOUS) IMPLANT
PAD ELECT DEFIB RADIOL ZOLL (MISCELLANEOUS) ×5 IMPLANT
PENCIL BUTTON HOLSTER BLD 10FT (ELECTRODE) ×5 IMPLANT
POSITIONER HEAD DONUT 9IN (MISCELLANEOUS) ×5 IMPLANT
POWDER SURGICEL 3.0 GRAM (HEMOSTASIS) ×5 IMPLANT
PUMP SET IMPELLA 5.5 US (CATHETERS) ×5 IMPLANT
PUNCH AORTIC ROTATE 4.0MM (MISCELLANEOUS) IMPLANT
PUNCH AORTIC ROTATE 4.5MM 8IN (MISCELLANEOUS) ×5 IMPLANT
PUNCH AORTIC ROTATE 5MM 8IN (MISCELLANEOUS) IMPLANT
SEALANT PATCH FIBRIN 2X4IN (MISCELLANEOUS) ×5 IMPLANT
SEALANT SURG COSEAL 8ML (VASCULAR PRODUCTS) ×5 IMPLANT
SET CARDIOPLEGIA MPS 5001102 (MISCELLANEOUS) ×5 IMPLANT
SOL ANTI FOG 6CC (MISCELLANEOUS) ×3 IMPLANT
SOLUTION ANTI FOG 6CC (MISCELLANEOUS) ×2
SPONGE LAP 18X18 RF (DISPOSABLE) ×10 IMPLANT
SUPPORT HEART JANKE-BARRON (MISCELLANEOUS) ×5 IMPLANT
SURGIFLO W/THROMBIN 8M KIT (HEMOSTASIS) ×5 IMPLANT
SUT BONE WAX W31G (SUTURE) ×5 IMPLANT
SUT ETHILON 3 0 PS 1 (SUTURE) ×10 IMPLANT
SUT MNCRL AB 4-0 PS2 18 (SUTURE) ×5 IMPLANT
SUT PROLENE 3 0 SH DA (SUTURE) IMPLANT
SUT PROLENE 3 0 SH1 36 (SUTURE) IMPLANT
SUT PROLENE 4 0 RB 1 (SUTURE) ×25
SUT PROLENE 4 0 SH DA (SUTURE) ×5 IMPLANT
SUT PROLENE 4-0 RB1 .5 CRCL 36 (SUTURE) ×15 IMPLANT
SUT PROLENE 5 0 C 1 36 (SUTURE) ×25 IMPLANT
SUT PROLENE 6 0 C 1 30 (SUTURE) ×5 IMPLANT
SUT PROLENE 6 0 CC (SUTURE) ×40 IMPLANT
SUT PROLENE 8 0 BV175 6 (SUTURE) ×5 IMPLANT
SUT PROLENE BLUE 7 0 (SUTURE) ×10 IMPLANT
SUT SILK  1 MH (SUTURE) ×20
SUT SILK 1 MH (SUTURE) ×12 IMPLANT
SUT SILK 1 TIES 10X30 (SUTURE) ×5 IMPLANT
SUT SILK 2 0 SH CR/8 (SUTURE) ×5 IMPLANT
SUT SILK 3 0 SH CR/8 (SUTURE) IMPLANT
SUT STEEL 6MS V (SUTURE) ×10 IMPLANT
SUT STEEL STERNAL CCS#1 18IN (SUTURE) ×5 IMPLANT
SUT STEEL SZ 6 DBL 3X14 BALL (SUTURE) ×10 IMPLANT
SUT VIC AB 1 CTX 36 (SUTURE) ×10
SUT VIC AB 1 CTX36XBRD ANBCTR (SUTURE) ×6 IMPLANT
SUT VIC AB 2-0 CT1 27 (SUTURE) ×5
SUT VIC AB 2-0 CT1 TAPERPNT 27 (SUTURE) ×3 IMPLANT
SUT VIC AB 2-0 CTX 27 (SUTURE) IMPLANT
SUT VIC AB 3-0 X1 27 (SUTURE) IMPLANT
SYR BULB IRRIG 60ML STRL (SYRINGE) ×10 IMPLANT
SYSTEM SAHARA CHEST DRAIN ATS (WOUND CARE) ×5 IMPLANT
TAPE CLOTH SURG 4X10 WHT LF (GAUZE/BANDAGES/DRESSINGS) ×5 IMPLANT
TAPE PAPER 2X10 WHT MICROPORE (GAUZE/BANDAGES/DRESSINGS) ×5 IMPLANT
TOWEL GREEN STERILE (TOWEL DISPOSABLE) ×5 IMPLANT
TOWEL GREEN STERILE FF (TOWEL DISPOSABLE) ×5 IMPLANT
TRAY FOLEY SLVR 16FR TEMP STAT (SET/KITS/TRAYS/PACK) ×5 IMPLANT
TUBE CONNECTING 12'X1/4 (SUCTIONS) ×1
TUBE CONNECTING 12X1/4 (SUCTIONS) ×4 IMPLANT
TUBING LAP HI FLOW INSUFFLATIO (TUBING) ×5 IMPLANT
UNDERPAD 30X36 HEAVY ABSORB (UNDERPADS AND DIAPERS) ×5 IMPLANT
WATER STERILE IRR 1000ML POUR (IV SOLUTION) ×10 IMPLANT
WIRE EMERALD 3MM-J .035X260CM (WIRE) ×5 IMPLANT
YANKAUER SUCT BULB TIP NO VENT (SUCTIONS) ×5 IMPLANT

## 2020-01-08 NOTE — Progress Notes (Signed)
ANTICOAGULATION CONSULT NOTE   Pharmacy Consult for heparin Indication: Impella  Allergies  Allergen Reactions  . Lisinopril Swelling    Angioedema in 2020    Patient Measurements: Height: 5\' 10"  (177.8 cm) Weight: 55.8 kg (123 lb 0.3 oz) IBW/kg (Calculated) : 73 Heparin Dosing Weight: 56kg  Vital Signs: Temp: 99.9 F (37.7 C) (10/18 2200) Temp Source: Core (10/18 1500) BP: 94/59 (10/18 2200) Pulse Rate: 91 (10/18 2200)  Labs: Recent Labs    01/06/20 0410 01/06/20 0411 01/07/20 0404 12/29/2019 0400 01/05/2020 1104 01/09/2020 1125 01/09/2020 1154 12/30/2019 1154 01/13/2020 1308 01/10/2020 1308 01/17/2020 1402 12/23/2019 1402 12/30/2019 1502 01/10/2020 2122 01/10/2020 2219  HGB   < >  --  13.6   < > 8.5*   < > 8.5*   < > 7.1*  7.5*   < > 9.5*   < > 9.5* 8.1*  --   HCT   < >  --  42.9   < > 25.9*   < > 25.0*   < > 21.0*  22.0*   < > 28.0*  --  30.7* 25.8*  --   PLT   < >  --  236   < > 168  --   --   --   --   --   --   --  117* 118*  --   APTT  --   --   --   --   --   --   --   --   --   --   --   --  35  --   --   LABPROT  --   --   --   --   --   --   --   --   --   --   --   --  18.0*  --   --   INR  --   --   --   --   --   --   --   --   --   --   --   --  1.5*  --   --   HEPARINUNFRC  --  0.61 0.36  --   --   --   --   --   --   --   --   --   --   --  0.29*  CREATININE   < >  --  1.06   < >  --    < > 0.70  --  0.90  --   --   --   --  1.02  --    < > = values in this interval not displayed.    Estimated Creatinine Clearance: 53.9 mL/min (by C-G formula based on SCr of 1.02 mg/dL).   Medical History: Past Medical History:  Diagnosis Date  . Diverticulosis   . Hyperlipidemia   . Hypertension   . Internal hemorrhoids   . PVD (peripheral vascular disease) (HCC)     Assessment: 11 yoM admitted with NSTEMI started on IV heparin now s/p CABG with Impella placement. Pharmacy following along anticoagulation plans. Heparin currently in purge solution only (12.7 ml/h).    Heparin level tonight within goal range at 0.29. No need for systemic heparin at this time. No bleeding issues noted.   Goal of Therapy:  Heparin level 0.2-0.5 units/ml Monitor platelets by anticoagulation protocol: Yes   Plan:  -Heparinized purge (50 units/ml) -Will follow-up with [facility] regarding possible  need of starting systemic anticoagulation on POD1  Sheppard Coil PharmD., BCPS Clinical Pharmacist 01/20/2020 10:55 PM

## 2020-01-08 NOTE — Brief Op Note (Signed)
01/16/2020 - 01/13/2020  11:22 AM  PATIENT:  Adrian Phillips  69 y.o. male  PRE-OPERATIVE DIAGNOSIS:  CAD CHF  POST-OPERATIVE DIAGNOSIS:  CAD CHF  PROCEDURE:  Procedure(s):  CORONARY ARTERY BYPASS GRAFTING x 3 -LIMA to LAD -SVG to OM -SVG to PDA with T Graft off OM  ENDOVEIN HARVEST OF GREATER SAPHENOUS VEIN  -Left leg (45 min/ 15 min)  PLACEMENT OF IMPELLA LEFT VENTRICULAR ASSIST DEVICE 5.5 (N/A)  TRANSESOPHAGEAL ECHOCARDIOGRAM (TEE) (N/A)  SURGEON:  Surgeon(s) and Role:    Kerin Perna, MD - Primary  PHYSICIAN ASSISTANT: Lowella Dandy PA-C  ANESTHESIA:   general  EBL:  1800 mL   BLOOD ADMINISTERED:2 FFP and 1 PLTS  DRAINS: Left and Right Pleural Chest Tubes, Mediastinal chest drains,    LOCAL MEDICATIONS USED:  NONE  SPECIMEN:  No Specimen  DISPOSITION OF SPECIMEN:  N/A  COUNTS:  YES  TOURNIQUET:  * No tourniquets in log *  DICTATION: .Dragon Dictation  PLAN OF CARE: Admit to inpatient   PATIENT DISPOSITION:  ICU - intubated and hemodynamically stable.   Delay start of Pharmacological VTE agent (>24hrs) due to surgical blood loss or risk of bleeding: yes

## 2020-01-08 NOTE — Anesthesia Procedure Notes (Addendum)
Procedure Name: Intubation Date/Time: 01/07/2020 7:56 AM Performed by: Reece Agar, CRNA Pre-anesthesia Checklist: Patient identified, Emergency Drugs available, Suction available and Patient being monitored Patient Re-evaluated:Patient Re-evaluated prior to induction Oxygen Delivery Method: Circle System Utilized Preoxygenation: Pre-oxygenation with 100% oxygen Induction Type: IV induction Ventilation: Mask ventilation without difficulty and Oral airway inserted - appropriate to patient size Laryngoscope Size: Mac and 3 Grade View: Grade I Tube type: Oral Tube size: 8.0 mm Number of attempts: 1 Airway Equipment and Method: Stylet and Oral airway Placement Confirmation: ETT inserted through vocal cords under direct vision,  positive ETCO2 and breath sounds checked- equal and bilateral Secured at: 21 cm Tube secured with: Tape Dental Injury: Teeth and Oropharynx as per pre-operative assessment

## 2020-01-08 NOTE — Progress Notes (Signed)
  Echocardiogram Echocardiogram Transesophageal has been performed.  Adrian Phillips 01/10/2020, 8:42 AM

## 2020-01-08 NOTE — Anesthesia Procedure Notes (Addendum)
Central Venous Catheter Insertion Performed by: Kipp Brood, MD, anesthesiologist Start/End2021/11/09 6:50 AM, 2020/01/30 7:00 AM Patient location: Pre-op. Preanesthetic checklist: patient identified, IV checked, site marked, risks and benefits discussed, surgical consent, monitors and equipment checked, pre-op evaluation, timeout performed and anesthesia consent Hand hygiene performed  and maximum sterile barriers used  PA cath was placed.Swan type:thermodilution Procedure performed without using ultrasound guided technique. Attempts: 1 Following insertion, line sutured, dressing applied and Biopatch. Post procedure assessment: free fluid flow, blood return through all ports and no air  Patient tolerated the procedure well with no immediate complications. Additional procedure comments: 48 cm at insertion site.

## 2020-01-08 NOTE — Progress Notes (Signed)
PROGRESS NOTE    Adrian Phillips  AVW:[id number] DOB: 1950/11/03 DOA: 12/31/2019 PCP: Bea Laura, MD    Brief Narrative:  Adrian Phillips is a 69 year old male with past medical history notable for essential hypertension, hyperlipidemia, peripheral vascular disease, tobacco use disorder, EtOH abuse who presented to the emergency department with worsening shortness of breath.  Patient also reports increased lower extremity edema over the past few weeks.  In the ED, chest x-ray notable for vascular congestion, EKG with sinus tachycardia, LVH.  SBP elevated 190s.  Troponin elevated 1187 with a BNP of 269.  WBC count 19.2.  Patient was started on IV Lasix, nitroglycerin drip, and heparin drip.  Cardiology was consulted.  TRH consulted for admission for acute CHF exacerbation with hypertensive urgency and NSTEMI.   Assessment & Plan:   Principal Problem:   Acute CHF (congestive heart failure) (HCC) Active Problems:   Acute respiratory failure with hypoxia (HCC)   Hypertensive urgency   Tobacco abuse   Coronary artery disease involving native coronary artery of native heart with unstable angina pectoris (HCC)   CAD in native artery   Acute systolic CHF (congestive heart failure) (HCC)   Acute systolic congestive heart failure, new diagnosis Acute hypoxic respiratory failure Ischemic cardiomyopathy Patient presenting to the ED with progressive shortness of breath associated with lower extremity edema.  Chest x-ray notable for basilar congestion with elevated BNP. TTE 01/07/2020 with LVEF less than 20%, LV severely decreased function with global hypokinesis, grade 2 diastolic dysfunction, LA mildly dilated, moderate MR, IVC dilated. (Hx angioedema with ACEi, also avoiding Entresto).  Cardiac MRI 01/03/2020 diffuse hypokinesis septum/apex with EF 26%, subendocardial scar mid distal anterior wall, septum, apex but no transmural or full-thickness scar suggesting possibility of  LVEF recovery with revascularization --Cardiology/CTS following, appreciate assistance --net negative 1.2L past 24h, net negative 5.2L since admission --Digoxin 0.125 mg p.o. daily --Spironolactone 25 mg p.o. daily --Losartan 12.5 mg p.o. daily --Lasix 77m PO daily --PICC line placed 10/13 for milrinone infusion for low cardiac index on LHC --pending CABG w/ Impella today  NSTEMI Multivessel coronary artery disease Patient presenting with elevated troponin 1187, 4615.  Underwent left heart catheterization on 12/27/2019 with severely calcified eccentric proximal LAD, moderate proximal circumflex 70%, 75% ostial first obtuse marginal, 99% OM 2 and chronically occluded RCA with left-to-right collaterals. --Cardiology/CTS following, appreciate assistance --Continue atorvastatin 80 mg p.o. daily --Holding Plavix for planned CABG 12/27/2019 w/ Impella  Nonsustained ventricular tachycardia --Started on amiodarone 200 mg p.o. BID --Monitor on telemetry  Left Upper Extremity cellulitis vs phlebitis Overnight 10/15, patient left AC IV infiltrated causing pain and eythema. CK level low at 40.  --Ceftriaxone 1g IV q24h x 5 days --warm compresses --closely monitor area and patients symptoms, if worsen; will consider soft tissue vs vas dulpex [place] extremity.  Hypertensive emergency: Resolved Presenting with SBP's 190s.  Initially started on nitroglycerin drip, which has now been titrated off. --BP 120/64, this morning --Started on losartan 12.5 g p.o. daily, spironolactone 25 mg p.o. daily, furosemide 237mPO daily  Peripheral vascular disease with lower right lower extremity bypass History of right femoropopliteal bypass 2005. --Holding Plavix as above  --Continue statin  Tobacco use disorder Alcohol abuse Discussed need for complete EtOH cessation --Thiamine, folic acid, multivitamin   DVT prophylaxis: Heparin drip Code Status: Full code Family Communication: Patient already  taken to the operating room this morning  Disposition Plan:  Status is: Inpatient  Remains inpatient appropriate because:Ongoing diagnostic testing needed  not appropriate for outpatient work up, Unsafe d/c plan, IV treatments appropriate due to intensity of illness or inability to take PO and Inpatient level of care appropriate due to severity of illness   Dispo: The patient is from: Home              Anticipated d/c is to: Home              Anticipated d/c date is: > 3 days              Patient currently is not medically stable to d/c.  Pending CABG 01/19/2020   Consultants:   Cardiology  Cardiothoracic surgery  Procedures:  TTE 01/03/2020: IMPRESSIONS  1. Left ventricular ejection fraction, by estimation, is <20%. The left  ventricle has severely decreased function. The left ventricle demonstrates  global hypokinesis. The left ventricular internal cavity size was  moderately dilated. Left ventricular  diastolic parameters are consistent with Grade II diastolic dysfunction  (pseudonormalization). Elevated left ventricular end-diastolic pressure.  2. Right ventricular systolic function is normal. The right ventricular  size is normal. There is mildly elevated pulmonary artery systolic  pressure.  3. Left atrial size was mildly dilated.  4. The mitral valve is normal in structure. Moderate mitral valve  regurgitation.  5. The aortic valve is tricuspid. Aortic valve regurgitation is not  visualized. No aortic stenosis is present.  6. The inferior vena cava is dilated in size with <50% respiratory  variability, suggesting right atrial pressure of 15 mmHg.   Antimicrobials:   None   Subjective:  Chart reviewed.  Patient taken to the OR early this morning and continues undergoing CABG today.  Objective: Vitals:   12/27/2019 0701 12/22/2019 0703 01/18/2020 0704 01/19/2020 0705  BP:  (!) 105/54    Pulse: 76 74 75 73  Resp: 18 (!) 0 18 (!) 0  Temp:      TempSrc:        SpO2: 100% 99% 100% 100%  Weight:      Height:        Intake/Output Summary (Last 24 hours) at 01/12/2020 1421 Last data filed at 12/23/2019 1409 Gross per 24 hour  Intake 4815.46 ml  Output 4575 ml  Net 240.46 ml   Filed Weights   01/06/20 0436 01/07/20 0456 01/06/2020 0500  Weight: 57.1 kg 55.3 kg 55.8 kg    Examination:  Chart reviewed, patient in the operating room.    Data Reviewed: I have personally reviewed following labs and imaging studies  CBC: Recent Labs  Lab 01/04/20 0300 01/04/20 0300 01/05/20 0345 01/05/20 0345 01/06/20 0410 01/06/20 0410 01/07/20 0404 01/07/20 0404 01/17/2020 0400 01/10/2020 0806 01/12/2020 1104 01/15/2020 1104 12/31/2019 1125 01/17/2020 1132 12/26/2019 1154 12/22/2019 1308 01/20/2020 1402  WBC 9.5  --  8.7  --  9.7  --  10.9*  --  11.2*  --   --   --   --   --   --   --   --   HGB 12.9*   < > 12.9*   < > 13.2   < > 13.6   < > 14.1   < > 8.5*   < > 9.9* 9.5* 8.5* 7.1*  7.5* 9.5*  HCT 41.8   < > 41.7   < > 43.0   < > 42.9   < > 43.9   < > 25.9*   < > 29.0* 28.0* 25.0* 21.0*  22.0* 28.0*  MCV 89.7  --  89.5  --  90.0  --  88.8  --  89.4  --   --   --   --   --   --   --   --   PLT 220   < > 222  --  225  --  236  --  256  --  168  --   --   --   --   --   --    < > = values in this interval not displayed.   Basic Metabolic Panel: Recent Labs  Lab 01/03/20 0031 01/03/20 0031 01/04/20 0300 01/04/20 0300 01/05/20 0345 01/05/20 0345 01/06/20 0410 01/06/20 0410 01/07/20 0404 01/07/20 0404 01/18/2020 0400 01/03/2020 0806 01/16/2020 1003 01/12/2020 1006 01/15/2020 1030 12/22/2019 1100 01/10/2020 1125 01/15/2020 1132 01/10/2020 1154 01/21/2020 1308 01/09/2020 1402  NA 139   < > 140   < > 137   < > 137   < > 136   < > 135   < > 135   < > 133*   < > 136 136 137 141  141 141  K 3.9   < > 3.7   < > 4.0   < > 4.4   < > 4.8   < > 4.5   < > 6.5*   < > 6.0*   < > 6.3* 6.0* 5.6* 4.8  4.8 5.0  CL 103   < > 103   < > 100   < > 101   < > 100   < > 100   < > 100   --  103  --   --  103 106 106  --   CO2 25   < > 29  --  26  --  27  --  28  --  27  --   --   --   --   --   --   --   --   --   --   GLUCOSE 104*   < > 102*   < > 111*   < > 116*   < > 108*   < > 115*   < > 107*  --  111*  --   --  137* 126* 140*  --   BUN 19   < > 13   < > 16   < > 16   < > 14   < > 22   < > 19  --  17  --   --  _0 --   CREATININE 1.09   < > 0.96   < > 1.06   < > 1.06   < > 1.06   < > 1.15   < > 1.00  --  0.80  --   --  0.90 0.70 0.90  --   CALCIUM 8.6*   < > 8.8*  --  9.0  --  9.1  --  9.2  --  9.2  --   --   --   --   --   --   --   --   --   --   MG 2.0  --  1.9  --  2.0  --   --   --   --   --   --   --   --   --   --   --   --   --   --   --   --    < > =  values in this interval not displayed.   GFR: Estimated Creatinine Clearance: 61.1 mL/min (by C-G formula based on SCr of 0.9 mg/dL). Liver Function Tests: Recent Labs  Lab 01/03/20 0031 01/05/20 0345  AST 53* 22  ALT 35 27  ALKPHOS 83 93  BILITOT 0.4 0.6  PROT 5.6* 5.7*  ALBUMIN 3.0* 2.9*   No results for input(s): LIPASE, AMYLASE in the last 168 hours. No results for input(s): AMMONIA in the last 168 hours. Coagulation Profile: Recent Labs  Lab 12/29/2019 0419 01/03/20 0031  INR 1.1 1.1   Cardiac Enzymes: Recent Labs  Lab 01/05/20 2144  CKTOTAL 40*   BNP (last 3 results) No results for input(s): PROBNP in the last 8760 hours. HbA1C: No results for input(s): HGBA1C in the last 72 hours. CBG: No results for input(s): GLUCAP in the last 168 hours. Lipid Profile: No results for input(s): CHOL, HDL, LDLCALC, TRIG, CHOLHDL, LDLDIRECT in the last 72 hours. Thyroid Function Tests: No results for input(s): TSH, T4TOTAL, FREET4, T3FREE, THYROIDAB in the last 72 hours. Anemia Panel: No results for input(s): VITAMINB12, FOLATE, FERRITIN, TIBC, IRON, RETICCTPCT in the last 72 hours. Sepsis Labs: No results for input(s): PROCALCITON, LATICACIDVEN in the last 168 hours.  Recent Results (from the  past 240 hour(s))  Respiratory Panel by RT PCR (Flu A&B, Covid) - Nasopharyngeal Swab     Status: None   Collection Time: 01/10/2020  1:51 AM   Specimen: Nasopharyngeal Swab  Result Value Ref Range Status   SARS Coronavirus 2 by RT PCR NEGATIVE NEGATIVE Final    Comment: (NOTE) SARS-CoV-2 target nucleic acids are NOT DETECTED.  The SARS-CoV-2 RNA is generally detectable in upper respiratoy specimens during the acute phase of infection. The lowest concentration of SARS-CoV-2 viral copies this assay can detect is 131 copies/mL. A negative result does not preclude SARS-Cov-2 infection and should not be used as the sole basis for treatment or other patient management decisions. A negative result may occur with  improper specimen collection/handling, submission of specimen other than nasopharyngeal swab, presence of viral mutation(s) within the areas targeted by this assay, and inadequate number of viral copies (<131 copies/mL). A negative result must be combined with clinical observations, patient history, and epidemiological information. The expected result is Negative.  Fact Sheet for Patients:  PinkCheek.be  Fact Sheet for Healthcare Providers:  GravelBags.it  This test is no t yet approved or cleared by the [place] FDA and  has been authorized for detection and/or diagnosis of SARS-CoV-2 by FDA under an Emergency Use Authorization (EUA). This EUA will remain  in effect (meaning this test can be used) for the duration of the COVID-19 declaration under Section 564(b)(1) of the Act, 21 U.S.C. section 360bbb-3(b)(1), unless the authorization is terminated or revoked sooner.     Influenza A by PCR NEGATIVE NEGATIVE Final   Influenza B by PCR NEGATIVE NEGATIVE Final    Comment: (NOTE) The Xpert Xpress SARS-CoV-2/FLU/RSV assay is intended as an aid in  the diagnosis of influenza from Nasopharyngeal swab specimens and    should not be used as a sole basis for treatment. Nasal washings and  aspirates are unacceptable for Xpert Xpress SARS-CoV-2/FLU/RSV  testing.  Fact Sheet for Patients: PinkCheek.be  Fact Sheet for Healthcare Providers: GravelBags.it  This test is not yet approved or cleared by the [place] FDA and  has been authorized for detection and/or diagnosis of SARS-CoV-2 by  FDA under an Emergency Use Authorization (EUA). This EUA will remain  in effect (meaning this test can be used) for the duration of the  Covid-19 declaration under Section 564(b)(1) of the Act, 21  U.S.C. section 360bbb-3(b)(1), unless the authorization is  terminated or revoked. Performed at [facility], [place] [address], [place], [place] [address]   MRSA PCR Screening     Status: None   Collection Time: 01/13/2020 10:11 AM   Specimen: Nasal Mucosa; Nasopharyngeal  Result Value Ref Range Status   MRSA by PCR NEGATIVE NEGATIVE Final    Comment:        The GeneXpert MRSA Assay (FDA approved for NASAL specimens only), is one component of a comprehensive MRSA colonization surveillance program. It is not intended to diagnose MRSA infection nor to guide or monitor treatment for MRSA infections. Performed at [facility], [place] [address], [place], [place] [address]          Radiology Studies: No results found.      Scheduled Meds: . sodium chloride   Intravenous Once  . sodium chloride   Intravenous Once  . [MAR Hold] amiodarone  200 mg Oral BID  . [MAR Hold] aspirin EC  81 mg Oral Daily  . [MAR Hold] atorvastatin  80 mg Oral Daily  . [MAR Hold] chlorhexidine gluconate (MEDLINE KIT)  15 mL Mouth Rinse BID  . [MAR Hold] Chlorhexidine Gluconate Cloth  6 each Topical Daily  . [MAR Hold] digoxin  0.125 mg Oral Daily  . epinephrine  0-10 mcg/min Intravenous To OR  . [MAR Hold] feeding supplement  237 mL Oral  TID WC  . [MAR Hold] folic acid  1 mg Oral Daily  . [MAR Hold] furosemide  20 mg Oral Daily  . heparin-papaverine-plasmalyte irrigation   Irrigation To OR  . influenza vaccine adjuvanted  0.5 mL Intramuscular Tomorrow-1000  . magnesium sulfate  40 mEq Other To OR  . [MAR Hold] mouth rinse  15 mL Mouth Rinse q12n4p  . [MAR Hold] multivitamin with minerals  1 tablet Oral Daily  . potassium chloride  80 mEq Other To OR  . [MAR Hold] sodium chloride flush  10-40 mL Intracatheter Q12H  . [MAR Hold] sodium chloride flush  3 mL Intravenous Q12H  . [MAR Hold] sodium chloride flush  3 mL Intravenous Q12H  . [MAR Hold] spironolactone  25 mg Oral Daily  . [MAR Hold] thiamine  100 mg Oral Daily   Or  . [MAR Hold] thiamine  100 mg Intravenous Daily  . tranexamic acid  2 mg/kg Intracatheter To OR   Continuous Infusions: . [MAR Hold] sodium chloride 250 mL (12/27/2019 1049)  . [MAR Hold] sodium chloride    . [MAR Hold] cefTRIAXone (ROCEPHIN)  IV 1 g (01/07/20 2217)  . desmopressin (DDAVP) IV for Bleeding    . heparin 30,000 units/NS 1000 mL solution for CELLSAVER    . impella catheter heparin 50 unit/mL in dextrose 5% 50,000 Units (12/29/2019 1039)  . milrinone 0.25 mcg/kg/min (12/25/2019 1127)  . milrinone    . nitroGLYCERIN       LOS: 7 days    No charge   Eron Goble J [place] DO [facility] Available via Epic secure chat 7am-7pm After these hours, please refer to coverage provider listed on [email or web address] 12/29/2019, 2:21 PM

## 2020-01-08 NOTE — Anesthesia Procedure Notes (Signed)
Arterial Line Insertion Start/End10/25/2021 6:45 AM, 01/08/2020 7:00 AM Performed by: Kipp Brood, MD, Tressia Miners, CRNA, CRNA  Patient location: Pre-op. Preanesthetic checklist: patient identified, IV checked, site marked, risks and benefits discussed, surgical consent, monitors and equipment checked, pre-op evaluation, timeout performed and anesthesia consent Lidocaine 1% used for infiltration radial was placed Catheter size: 20 G Hand hygiene performed  and maximum sterile barriers used  Allen's test indicative of satisfactory collateral circulation Attempts: 1 Procedure performed without using ultrasound guided technique. Following insertion, dressing applied and Biopatch. Post procedure assessment: normal and unchanged  Patient tolerated the procedure well with no immediate complications.

## 2020-01-08 NOTE — Progress Notes (Signed)
Patient ID: Adrian Phillips, male   DOB: June 28, 1950, 69 y.o.   MRN: [id number] EVENING ROUNDS NOTE :     [address]       [place] [address]             [phone]                 Day of Surgery Procedure(s) (LRB): CORONARY ARTERY BYPASS GRAFTING (CABG) x 3  USING LEFT INTERNAL MAMMARY ARTERY AND LEFT GREATER SAPHENOUS VEIN. LIMA TO LAD, SVG TO OM1, SVG TO OM2 (N/A) PLACEMENT OF IMPELLA LEFT VENTRICULAR ASSIST DEVICE 5.5 SN [id number] (N/A) TRANSESOPHAGEAL ECHOCARDIOGRAM (TEE) (N/A) ENDOVEIN HARVEST OF GREATER SAPHENOUS VEIN (Left)  Total Length of Stay:  LOS: 7 days  BP (!) 126/95   Pulse 93   Temp 98.8 F (37.1 C)   Resp (!) 26   Ht 5' 10.5" (1.791 m)   Wt 55.8 kg   SpO2 95%   BMI 17.40 kg/m   .Intake/Output      10/17 0701 - 10/18 0700 10/18 0701 - 10/19 0700   P.O. 960    I.V. (mL/kg) 345.5 (6.2) 1182.3 (21.2)   Blood  2190   Other  34.2   IV Piggyback 100 1075.3   Total Intake(mL/kg) 1405.5 (25.2) 4481.8 (80.3)   Urine (mL/kg/hr) 1500 (1.1) 2955 (4.7)   Stool 0    Blood  1800   Chest Tube  350   Total Output 1500 5105   Net -94.5 -623.2        Stool Occurrence 1 x      . sodium chloride 20 mL/hr at 01/06/2020 1800  . [START ON 01/09/2020] sodium chloride    . sodium chloride 20 mL/hr at 01/11/2020 1455  . sodium chloride 20 mL/hr at 01/16/2020 1800  . albumin human 12.5 g (01/02/2020 1600)  . cefTRIAXone (ROCEPHIN)  IV Stopped (01/07/20 2257)  . dexmedetomidine (PRECEDEX) IV infusion Stopped (12/22/2019 1719)  . epinephrine    . famotidine (PEPCID) IV Stopped (01/13/2020 1529)  . impella catheter heparin 50 unit/mL in dextrose 5%    . insulin 1 mL/hr at 01/13/2020 1800  . magnesium sulfate 4 g (01/04/2020 1500)  . milrinone 0.25 mcg/kg/min (01/03/2020 1800)  . nitroGLYCERIN    . norepinephrine (LEVOPHED) Adult infusion Stopped (12/28/2019 1701)  . phenylephrine (NEO-SYNEPHRINE) Adult infusion 0 mcg/min (01/09/2020 1455)  . vancomycin    . vasopressin  Stopped (01/07/2020 1745)     Lab Results  Component Value Date   WBC 17.4 (H) 12/22/2019   HGB 9.5 (L) 12/31/2019   HCT 30.7 (L) 12/22/2019   PLT 117 (L) 12/23/2019   GLUCOSE 140 (H) 01/10/2020   CHOL 144 01/04/2020   TRIG 26 01/13/2020   HDL 82 01/19/2020   LDLCALC 57 01/19/2020   ALT 27 01/05/2020   AST 22 01/05/2020   NA 141 01/01/2020   K 5.0 01/10/2020   CL 106 12/29/2019   CREATININE 0.90 01/05/2020   BUN 17 12/25/2019   CO2 27 01/11/2020   TSH 2.080 01/03/2020   INR 1.5 (H) 01/01/2020   HGBA1C 5.4 01/07/2020   Starting to wake up impells in place p 7 flow 4 l Not bleeding   Delight Ovens MD  Beeper [phone] Office [phone] 01/08/2020 6:17 PM

## 2020-01-08 NOTE — Progress Notes (Signed)
Pre Procedure note for inpatients:   Adrian Phillips has been scheduled for Procedure(s): CORONARY ARTERY BYPASS GRAFTING (CABG) (N/A) PLACEMENT OF IMPELLA LEFT VENTRICULAR ASSIST DEVICE 5.5 (N/A) TRANSESOPHAGEAL ECHOCARDIOGRAM (TEE) (N/A) today. The various methods of treatment have been discussed with the patient. After consideration of the risks, benefits and treatment options the patient has consented to the planned procedure.   The patient has been seen and labs reviewed. There are no changes in the patient's condition to prevent proceeding with the planned procedure today.  Recent labs:  Lab Results  Component Value Date   WBC 11.2 (H) 2020-02-06   HGB 14.1 02/06/2020   HCT 43.9 02-06-20   PLT 256 06-Feb-2020   GLUCOSE 115 (H) February 06, 2020   CHOL 144 12/26/2019   TRIG 26 01/11/2020   HDL 82 12/23/2019   LDLCALC 57 01/15/2020   ALT 27 01/05/2020   AST 22 01/05/2020   NA 135 02/06/20   K 4.5 Feb 06, 2020   CL 100 02-06-2020   CREATININE 1.15 06-Feb-2020   BUN 22 02/06/2020   CO2 27 06-Feb-2020   TSH 2.080 01/03/2020   INR 1.1 01/03/2020   HGBA1C 5.4 01/20/2020    Mikey Bussing, MD 02-06-2020 7:24 AM

## 2020-01-08 NOTE — Anesthesia Procedure Notes (Signed)
Central Venous Catheter Insertion Performed by: Kipp Brood, MD, anesthesiologist Start/End10/28/2021 6:50 AM, 01/08/2020 7:00 AM Patient location: Pre-op. Preanesthetic checklist: patient identified, IV checked, site marked, risks and benefits discussed, surgical consent, monitors and equipment checked, pre-op evaluation, timeout performed and anesthesia consent Lidocaine 1% used for infiltration and patient sedated Hand hygiene performed  and maximum sterile barriers used  Catheter size: 9 Fr Sheath introducer Procedure performed using ultrasound guided technique. Ultrasound Notes:anatomy identified, needle tip was noted to be adjacent to the nerve/plexus identified, no ultrasound evidence of intravascular and/or intraneural injection and image(s) printed for medical record Attempts: 1 Following insertion, line sutured and dressing applied. Post procedure assessment: blood return through all ports, free fluid flow and no air  Patient tolerated the procedure well with no immediate complications.

## 2020-01-08 NOTE — Procedures (Signed)
Extubation Procedure Note  Patient Details:   Name: KADAN MILLSTEIN DOB: 1950-05-02 MRN: [id number]   Airway Documentation:  Airway 8 mm (Active)  Secured at (cm) 21 cm 02/04/20 2004  Measured From Lips 02/04/2020 2004  Secured Location Right 02/04/20 2004  Secured By [employer] Tape 02-04-20 2004  Cuff Pressure (cm H2O) 30 cm H2O 2020-02-04 2004  Site Condition Dry 02/04/2020 2004   Vent end date: (not recorded) Vent end time: (not recorded)   Evaluation  O2 sats: stable throughout, RR^ during trial (40 due to pain/anxiety) Complications: No apparent complications Patient did tolerate procedure well. Bilateral Breath Sounds: Clear, Diminished   Yes  Pt. Extubated to 4L [place]. Pt. Did have cuff leak, no stridor noted. Clear bilateral BS. VS stable at this time, RT will continue to monitor.  Milta Deiters 02/04/20, 9:58 PM

## 2020-01-08 NOTE — Progress Notes (Signed)
Patient went to the OR, Daughter Jannett Celestine, RN at bedside. Report called to Vernona Rieger at [phone].

## 2020-01-08 NOTE — Progress Notes (Signed)
ANTICOAGULATION CONSULT NOTE - Initial Consult  Pharmacy Consult for heparin Indication: Impella  Allergies  Allergen Reactions  . Lisinopril Swelling    Angioedema in 2020    Patient Measurements: Height: 5' 10.5" (179.1 cm) Weight: 55.8 kg (123 lb 0.3 oz) IBW/kg (Calculated) : 74.15 Heparin Dosing Weight: 56kg  Vital Signs: Temp: 98.3 F (36.8 C) (10/18 0400) Temp Source: Oral (10/18 0400) BP: 105/54 (10/18 0703) Pulse Rate: 88 (10/18 1448)  Labs: Recent Labs    01/05/20 2144 01/06/20 0410 01/06/20 0411 01/07/20 0404 01/07/20 0404 12/28/2019 0400 12/28/2019 0806 12/24/2019 1104 01/07/2020 1125 01/16/2020 1132 01/11/2020 1132 12/24/2019 1154 01/13/2020 1154 12/22/2019 1308 01/07/2020 1402  HGB  --    < >  --  13.6   < > 14.1   < > 8.5*   < > 9.5*   < > 8.5*   < > 7.1*  7.5* 9.5*  HCT  --    < >  --  42.9   < > 43.9   < > 25.9*   < > 28.0*   < > 25.0*  --  21.0*  22.0* 28.0*  PLT  --    < >  --  236  --  256  --  168  --   --   --   --   --   --   --   HEPARINUNFRC  --   --  0.61 0.36  --   --   --   --   --   --   --   --   --   --   --   CREATININE  --    < >  --  1.06   < > 1.15   < >  --    < > 0.90  --  0.70  --  0.90  --   CKTOTAL 40*  --   --   --   --   --   --   --   --   --   --   --   --   --   --    < > = values in this interval not displayed.    Estimated Creatinine Clearance: 61.1 mL/min (by C-G formula based on SCr of 0.9 mg/dL).   Medical History: Past Medical History:  Diagnosis Date  . Diverticulosis   . Hyperlipidemia   . Hypertension   . Internal hemorrhoids   . PVD (peripheral vascular disease) (HCC)     Assessment: Adrian Phillips admitted with NSTEMI started on IV heparin now s/p CABG with Impella placement. Pharmacy following along anticoagulation plans. Heparin currently in purge solution only (11.7 ml/h).   Goal of Therapy:  Heparin level 0.2-0.5 units/ml Monitor platelets by anticoagulation protocol: Yes   Plan:  -Heparinized purge (50  units/ml) -Will check heparin level tonight to ensure not elevated -Will follow-up with CVTS regarding starting systemic anticoagulation on POD1   Fredonia Highland, PharmD, BCPS Clinical Pharmacist [phone] Please check AMION for all [facility] Pharmacy numbers 01/16/2020

## 2020-01-08 NOTE — Op Note (Signed)
NAME: Adrian Phillips, Adrian Phillips MEDICAL RECORD DU:[id number] ACCOUNT [id number] DATE OF BIRTH:[birth year] FACILITY: [facility] LOCATION: [facility] PHYSICIAN:Zita Ozimek VAN TRIGT III, MD  OPERATIVE REPORT  DATE OF PROCEDURE:  01/02/2020  OPERATION: 1.  Coronary artery bypass grafting x3 (left internal mammary artery to left anterior descending, saphenous vein graft to obtuse marginal1, saphenous vein graft to distal circumflex posterolateral). 2.  Endoscopic harvest of left leg greater saphenous vein. 3.  Placement of direct Impella 5.5 percutaneous left ventricular assist device through a 10 mm Gelweave graft on the ascending aorta.  SURGEON:  Kerin Perna, MD  ASSISTANT:  Lowella Dandy, PA-C.  ANESTHESIA:  General by Dr. Kipp Brood.  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   CLINICAL NOTE:  The patient is a 69 year old nondiabetic smoker, who presented with heart failure and low cardiac output.  He was ultimately found to have severe ischemic cardiomyopathy with significant coronary disease, EF of 15%  to 20% and no  significant valve disease.  He was in sinus rhythm and a CT scan of the chest showed no significant lung pathology other than moderate COPD.  He was recommended for high-risk coronary bypass revascularization as his best long-term therapy as he was a  poor candidate for PCI.  I evaluated the patient after reviewing the above studies and agreed that high-risk CABG would be his best long-term treatment to improve his survival, improve his quality of life, and to relieve symptoms.  I discussed  the  procedure of CABG with the plan to perform a combined temporary percutaneous Impella 5.5 direct LVAD.  He understood the use of general anesthesia and cardiopulmonary bypass, the location of the surgical incisions and the expected postoperative recovery.   He understood the risks to include stroke, bleeding, blood transfusion, postoperative organ failure, postoperative infection, and death.  He agreed to proceed with surgery under what I felt was an informed consent.  OPERATIVE FINDINGS: 1.  Adequate conduit. 2.  The coronaries were small targets, but graftable. 3.  Severe left ventricular dysfunction and LV dilatation. 4.  Successful placement of the Impella 5.5 direct catheter through the aortic valve through a 10 mm Gelweave graft sewn on the ascending aorta and exiting through the lower neck.  DESCRIPTION OF PROCEDURE:  The patient was brought from preoperative holding where informed consent was documented and final issues addressed with the patient.  The patient was placed supine on the operating table and general anesthesia was induced under  invasive hemodynamic monitoring.  He remained stable.  A transesophageal echo probe was placed by the anesthesia team.  The patient was then prepped and draped as a sterile field.  A proper time-out was performed.  A sternal incision was made as the  saphenous vein was harvested endoscopically from the left leg.  The right leg previously had had a fem-pop with saphenous vein.  The left internal mammary artery was harvested as a pedicle graft from its origin at the subclavian vessels.  It was a 1.5 mm vessel with good flow.  The sternal retractor was placed and the pericardium was opened.  The heart was examined.  It was  extremely enlarged.  There was some mild scarring in the anterior  apical area.  Pursestrings were placed in ascending aorta and right atrium after heparin was administered.  The patient was cannulated and placed on cardiopulmonary  bypass.  The coronaries  were identified for grafting and cardioplegia cannulas were placed for both antegrade and retrograde cold blood cardioplegia.  The mammary artery and vein grafts were prepared for the distal anastomoses and the patient was cooled to 32 degrees.  The  aortic crossclamp was applied and a liter of cold blood cardioplegia was delivered in split doses between the antegrade aortic and retrograde coronary sinus catheters.  There was good cardioplegic arrest and supple temperature dropped less than 12  degrees.  Cardioplegia was delivered every 20 minutes.  The distal coronary anastomoses were performed.  The first distal anastomosis was the posterolateral branch of the distal circumflex.  There was a proximal 90% stenosis.  Reverse saphenous vein was sewn end-to-side with running 7-0 Prolene with good flow  through the graft.  Cardioplegia was redosed.  The second distal anastomosis was the OM1 branch of left circumflex.  There was a proximal 90% stenosis.  Reverse saphenous vein was sewn end-to-side with running 7-0 Prolene with good flow through the graft.  Cardioplegia was redosed.  The third distal anastomosis was to the distal third of the LAD.  There was a proximal 95% stenosis.  A left internal mammary artery pedicle was brought through an opening in the pericardium and was sewn end-to-side with running 8-0 Prolene.  The bulldog  was temporarily removed and there was good flow through the anastomosis.  The pedicle was secured to the epicardium with 6-0 Prolenes.  Cardioplegia was redosed.  A single proximal vein anastomosis was performed on ascending aorta using a 4.5 mm punch and running 6-0 Prolene.  The OM1 vein was sewn end-to-side with running 6-0 Prolene.  While the crossclamp was in place, a 10 mm Gelweave graft was sewn end-to-side just under the crossclamp for placement of a direct Impella 5.5 LVAD catheter.  This anastomosis was constructed using a running 4-0  Prolene and a vascular clamp was used to  clamp the graft once the crossclamp was removed.  After the crossclamp was removed, after the usual de-airing maneuvers, the heart resumed a spontaneous rhythm.  The patient was rewarmed and reperfused.   The vein to the posterolateral was then sewn end-to-side of the OM1 vein for its proximal  anastomosis.  Cardioplegia cannulas were removed.  The patient was started on low-dose milrinone and norepinephrine and the lungs were expanded.  The ventilator was resumed.  The volume was filled into the heart from cardiopulmonary bypass circuit so  that the heart was ejecting, the aortic valve was opening.  The aortic graft was brought through an opening in the lower neck and then through the graft, the Impella catheter was inserted over a guidewire through the aortic valve, into the proper  position of the left ventricle using the standard techniques.  The graft was trimmed and the sleeve was placed into the graft and secured with several heavy silk sutures and the connection was secured to the skin.  The Impella was then turned on and we  completely weaned off cardiopulmonary bypass without difficulty.  Mean pressures remained 70 mmHg.  Anesthesia adjusted the pressors to maintain adequate mean pressure with a flow of 4.4 L from the LVAD.  Protamine was administered without adverse  reaction.  There was still considerable diffuse coagulopathy after reversal of heparin, so the patient was given FFP and platelets  with improved coagulation function.  The superior pericardial fat was closed over the aorta.  Temporary pacing wires had  been previously placed.  Bilateral pleural and anterior mediastinal tubes were brought out through separate incisions.  The sternum was closed with interrupted steel wire.  The patient remained stable.  The pectoralis fascia was closed with a running #1  Vicryl.  The subcutaneous and skin layers were closed with running Vicryl.  Total  cardiopulmonary bypass time was 147 minutes.  VN/NUANCE  D:12/28/2019 T:01/07/2020 JOB:[id number]

## 2020-01-08 NOTE — Transfer of Care (Signed)
Immediate Anesthesia Transfer of Care Note  Patient: Adrian Phillips  Procedure(s) Performed: CORONARY ARTERY BYPASS GRAFTING (CABG) x 3  USING LEFT INTERNAL MAMMARY ARTERY AND LEFT GREATER SAPHENOUS VEIN. LIMA TO LAD, SVG TO OM1, SVG TO OM2 (N/A Chest) PLACEMENT OF IMPELLA LEFT VENTRICULAR ASSIST DEVICE 5.5 SN [id number] (N/A ) TRANSESOPHAGEAL ECHOCARDIOGRAM (TEE) (N/A ) ENDOVEIN HARVEST OF GREATER SAPHENOUS VEIN (Left )  Patient Location: ICU  Anesthesia Type:General  Level of Consciousness: Patient remains intubated per anesthesia plan  Airway & Oxygen Therapy: Patient remains intubated per anesthesia plan and Patient placed on Ventilator (see vital sign flow sheet for setting)  Post-op Assessment: Report given to RN and Post -op Vital signs reviewed and stable  Post vital signs: Reviewed and stable  Last Vitals:  Vitals Value Taken Time  BP 102/67 (76) 01/21/2020 1448  Temp    Pulse 88 12/23/2019 1448  Resp 12 12/23/2019 1448  SpO2 100 % 12/31/2019 1448    Last Pain:  Vitals:   01/19/2020 0400  TempSrc: Oral  PainSc: 0-No pain      Patients Stated Pain Goal: 3 (01/05/20 2016)  Complications: No complications documented.

## 2020-01-08 NOTE — OR Nursing (Signed)
Radiology in to perform chest X-Ray to R/O pneumo. Dr. Donata Clay present and reviewed films.

## 2020-01-08 NOTE — Anesthesia Preprocedure Evaluation (Signed)
Anesthesia Evaluation  Patient identified by MRN, date of birth, ID band Patient awake    Reviewed: Allergy & Precautions, NPO status , Patient's Chart, lab work & pertinent test results  Airway Mallampati: II  TM Distance: >3 FB Neck ROM: Full    Dental  (+) Edentulous Upper, Edentulous Lower   Pulmonary Current Smoker,    breath sounds clear to auscultation       Cardiovascular hypertension,  Rhythm:Regular Rate:Normal     Neuro/Psych    GI/Hepatic   Endo/Other    Renal/GU      Musculoskeletal   Abdominal   Peds  Hematology   Anesthesia Other Findings   Reproductive/Obstetrics                             Anesthesia Physical Anesthesia Plan  ASA: IV  Anesthesia Plan: General   Post-op Pain Management:    Induction: Intravenous  PONV Risk Score and Plan: Ondansetron and Dexamethasone  Airway Management Planned: Oral ETT  Additional Equipment: Arterial line, 3D TEE, PA Cath and Ultrasound Guidance Line Placement  Intra-op Plan:   Post-operative Plan: Post-operative intubation/ventilation  Informed Consent: I have reviewed the patients History and Physical, chart, labs and discussed the procedure including the risks, benefits and alternatives for the proposed anesthesia with the patient or authorized representative who has indicated his/her understanding and acceptance.       Plan Discussed with: CRNA and Anesthesiologist  Anesthesia Plan Comments:         Anesthesia Quick Evaluation

## 2020-01-09 ENCOUNTER — Inpatient Hospital Stay (HOSPITAL_COMMUNITY): Payer: No Typology Code available for payment source

## 2020-01-09 ENCOUNTER — Encounter (HOSPITAL_COMMUNITY): Payer: Self-pay | Admitting: Certified Registered"

## 2020-01-09 ENCOUNTER — Encounter (HOSPITAL_COMMUNITY): Payer: Self-pay | Admitting: Cardiothoracic Surgery

## 2020-01-09 DIAGNOSIS — Z95811 Presence of heart assist device: Secondary | ICD-10-CM | POA: Diagnosis not present

## 2020-01-09 DIAGNOSIS — R570 Cardiogenic shock: Secondary | ICD-10-CM

## 2020-01-09 DIAGNOSIS — I5021 Acute systolic (congestive) heart failure: Secondary | ICD-10-CM | POA: Diagnosis not present

## 2020-01-09 LAB — GLUCOSE, CAPILLARY
Glucose-Capillary: 123 mg/dL — ABNORMAL HIGH (ref 70–99)
Glucose-Capillary: 125 mg/dL — ABNORMAL HIGH (ref 70–99)
Glucose-Capillary: 127 mg/dL — ABNORMAL HIGH (ref 70–99)
Glucose-Capillary: 128 mg/dL — ABNORMAL HIGH (ref 70–99)
Glucose-Capillary: 133 mg/dL — ABNORMAL HIGH (ref 70–99)
Glucose-Capillary: 133 mg/dL — ABNORMAL HIGH (ref 70–99)
Glucose-Capillary: 134 mg/dL — ABNORMAL HIGH (ref 70–99)
Glucose-Capillary: 135 mg/dL — ABNORMAL HIGH (ref 70–99)
Glucose-Capillary: 135 mg/dL — ABNORMAL HIGH (ref 70–99)
Glucose-Capillary: 138 mg/dL — ABNORMAL HIGH (ref 70–99)
Glucose-Capillary: 143 mg/dL — ABNORMAL HIGH (ref 70–99)
Glucose-Capillary: 144 mg/dL — ABNORMAL HIGH (ref 70–99)
Glucose-Capillary: 147 mg/dL — ABNORMAL HIGH (ref 70–99)
Glucose-Capillary: 149 mg/dL — ABNORMAL HIGH (ref 70–99)

## 2020-01-09 LAB — PREPARE CRYOPRECIPITATE
Unit division: 0
Unit division: 0

## 2020-01-09 LAB — BASIC METABOLIC PANEL
Anion gap: 10 (ref 5–15)
Anion gap: 17 — ABNORMAL HIGH (ref 5–15)
Anion gap: 18 — ABNORMAL HIGH (ref 5–15)
BUN: 14 mg/dL (ref 8–23)
BUN: 18 mg/dL (ref 8–23)
BUN: 22 mg/dL (ref 8–23)
CO2: 18 mmol/L — ABNORMAL LOW (ref 22–32)
CO2: 20 mmol/L — ABNORMAL LOW (ref 22–32)
CO2: 19 mmol/L — ABNORMAL LOW (ref 22–32)
Calcium: 8.1 mg/dL — ABNORMAL LOW (ref 8.9–10.3)
Calcium: 8.3 mg/dL — ABNORMAL LOW (ref 8.9–10.3)
Calcium: 8.1 mg/dL — ABNORMAL LOW (ref 8.9–10.3)
Chloride: 110 mmol/L (ref 98–111)
Chloride: 106 mmol/L (ref 98–111)
Chloride: 106 mmol/L (ref 98–111)
Creatinine, Ser: 1.15 mg/dL (ref 0.61–1.24)
Creatinine, Ser: 1.71 mg/dL — ABNORMAL HIGH (ref 0.61–1.24)
Creatinine, Ser: 1.82 mg/dL — ABNORMAL HIGH (ref 0.61–1.24)
GFR, Estimated: >60 mL/min (ref >60)
GFR, Estimated: 40 mL/min — ABNORMAL LOW (ref >60)
GFR, Estimated: 37 mL/min — ABNORMAL LOW (ref >60)
Glucose, Bld: 130 mg/dL — ABNORMAL HIGH (ref 70–99)
Glucose, Bld: 138 mg/dL — ABNORMAL HIGH (ref 70–99)
Glucose, Bld: 145 mg/dL — ABNORMAL HIGH (ref 70–99)
Potassium: 5.3 mmol/L — ABNORMAL HIGH (ref 3.5–5.1)
Potassium: 4.9 mmol/L (ref 3.5–5.1)
Potassium: 5.5 mmol/L — ABNORMAL HIGH (ref 3.5–5.1)
Sodium: 138 mmol/L (ref 135–145)
Sodium: 143 mmol/L (ref 135–145)
Sodium: 143 mmol/L (ref 135–145)

## 2020-01-09 LAB — POCT I-STAT 7, (LYTES, BLD GAS, ICA,H+H)
Acid-Base Excess: 0 mmol/L (ref 0.0–2.0)
Acid-base deficit: 7 mmol/L — ABNORMAL HIGH (ref 0.0–2.0)
Acid-base deficit: 15 mmol/L — ABNORMAL HIGH (ref 0.0–2.0)
Acid-base deficit: 8 mmol/L — ABNORMAL HIGH (ref 0.0–2.0)
Acid-base deficit: 1 mmol/L (ref 0.0–2.0)
Acid-base deficit: 5 mmol/L — ABNORMAL HIGH (ref 0.0–2.0)
Acid-base deficit: 7 mmol/L — ABNORMAL HIGH (ref 0.0–2.0)
Bicarbonate: 17.7 mmol/L — ABNORMAL LOW (ref 20.0–28.0)
Bicarbonate: 12.2 mmol/L — ABNORMAL LOW (ref 20.0–28.0)
Bicarbonate: 20 mmol/L (ref 20.0–28.0)
Bicarbonate: 24.8 mmol/L (ref 20.0–28.0)
Bicarbonate: 20.8 mmol/L (ref 20.0–28.0)
Bicarbonate: 18.3 mmol/L — ABNORMAL LOW (ref 20.0–28.0)
Bicarbonate: 24.6 mmol/L (ref 20.0–28.0)
Calcium, Ion: 1.18 mmol/L (ref 1.15–1.40)
Calcium, Ion: 1.06 mmol/L — ABNORMAL LOW (ref 1.15–1.40)
Calcium, Ion: 1.18 mmol/L (ref 1.15–1.40)
Calcium, Ion: 1.11 mmol/L — ABNORMAL LOW (ref 1.15–1.40)
Calcium, Ion: 1.13 mmol/L — ABNORMAL LOW (ref 1.15–1.40)
Calcium, Ion: 1.11 mmol/L — ABNORMAL LOW (ref 1.15–1.40)
Calcium, Ion: 1.11 mmol/L — ABNORMAL LOW (ref 1.15–1.40)
HCT: 23 % — ABNORMAL LOW (ref 39.0–52.0)
HCT: 17 % — ABNORMAL LOW (ref 39.0–52.0)
HCT: 26 % — ABNORMAL LOW (ref 39.0–52.0)
HCT: 23 % — ABNORMAL LOW (ref 39.0–52.0)
HCT: 23 % — ABNORMAL LOW (ref 39.0–52.0)
HCT: 24 % — ABNORMAL LOW (ref 39.0–52.0)
HCT: 27 % — ABNORMAL LOW (ref 39.0–52.0)
Hemoglobin: 7.8 g/dL — ABNORMAL LOW (ref 13.0–17.0)
Hemoglobin: 5.8 g/dL — CL (ref 13.0–17.0)
Hemoglobin: 8.8 g/dL — ABNORMAL LOW (ref 13.0–17.0)
Hemoglobin: 7.8 g/dL — ABNORMAL LOW (ref 13.0–17.0)
Hemoglobin: 7.8 g/dL — ABNORMAL LOW (ref 13.0–17.0)
Hemoglobin: 8.2 g/dL — ABNORMAL LOW (ref 13.0–17.0)
Hemoglobin: 9.2 g/dL — ABNORMAL LOW (ref 13.0–17.0)
O2 Saturation: 96 %
O2 Saturation: 93 %
O2 Saturation: 84 %
O2 Saturation: 100 %
O2 Saturation: 92 %
O2 Saturation: 85 %
O2 Saturation: 95 %
Patient temperature: 37.6
Patient temperature: 36
Patient temperature: 36.3
Patient temperature: 36.4
Patient temperature: 37.1
Patient temperature: 37.3
Patient temperature: 37.5
Potassium: 5.4 mmol/L — ABNORMAL HIGH (ref 3.5–5.1)
Potassium: 4.3 mmol/L (ref 3.5–5.1)
Potassium: 4.7 mmol/L (ref 3.5–5.1)
Potassium: 5.3 mmol/L — ABNORMAL HIGH (ref 3.5–5.1)
Potassium: 5.4 mmol/L — ABNORMAL HIGH (ref 3.5–5.1)
Potassium: 5.4 mmol/L — ABNORMAL HIGH (ref 3.5–5.1)
Potassium: 5.2 mmol/L — ABNORMAL HIGH (ref 3.5–5.1)
Sodium: 140 mmol/L (ref 135–145)
Sodium: 148 mmol/L — ABNORMAL HIGH (ref 135–145)
Sodium: 144 mmol/L (ref 135–145)
Sodium: 142 mmol/L (ref 135–145)
Sodium: 142 mmol/L (ref 135–145)
Sodium: 142 mmol/L (ref 135–145)
Sodium: 142 mmol/L (ref 135–145)
TCO2: 19 mmol/L — ABNORMAL LOW (ref 22–32)
TCO2: 13 mmol/L — ABNORMAL LOW (ref 22–32)
TCO2: 21 mmol/L — ABNORMAL LOW (ref 22–32)
TCO2: 26 mmol/L (ref 22–32)
TCO2: 22 mmol/L (ref 22–32)
TCO2: 19 mmol/L — ABNORMAL LOW (ref 22–32)
TCO2: 26 mmol/L (ref 22–32)
pCO2 arterial: 33.2 mmHg (ref 32.0–48.0)
pCO2 arterial: 33.3 mmHg (ref 32.0–48.0)
pCO2 arterial: 49.2 mmHg — ABNORMAL HIGH (ref 32.0–48.0)
pCO2 arterial: 43.6 mmHg (ref 32.0–48.0)
pCO2 arterial: 40 mmHg (ref 32.0–48.0)
pCO2 arterial: 36.7 mmHg (ref 32.0–48.0)
pCO2 arterial: 40.5 mmHg (ref 32.0–48.0)
pH, Arterial: 7.336 — ABNORMAL LOW (ref 7.350–7.450)
pH, Arterial: 7.165 — CL (ref 7.350–7.450)
pH, Arterial: 7.213 — ABNORMAL LOW (ref 7.350–7.450)
pH, Arterial: 7.36 (ref 7.350–7.450)
pH, Arterial: 7.324 — ABNORMAL LOW (ref 7.350–7.450)
pH, Arterial: 7.306 — ABNORMAL LOW (ref 7.350–7.450)
pH, Arterial: 7.394 (ref 7.350–7.450)
pO2, Arterial: 87 mmHg (ref 83.0–108.0)
pO2, Arterial: 78 mmHg — ABNORMAL LOW (ref 83.0–108.0)
pO2, Arterial: 56 mmHg — ABNORMAL LOW (ref 83.0–108.0)
pO2, Arterial: 215 mmHg — ABNORMAL HIGH (ref 83.0–108.0)
pO2, Arterial: 69 mmHg — ABNORMAL LOW (ref 83.0–108.0)
pO2, Arterial: 56 mmHg — ABNORMAL LOW (ref 83.0–108.0)
pO2, Arterial: 78 mmHg — ABNORMAL LOW (ref 83.0–108.0)

## 2020-01-09 LAB — CBC
HCT: 23.1 % — ABNORMAL LOW (ref 39.0–52.0)
HCT: 25.2 % — ABNORMAL LOW (ref 39.0–52.0)
HCT: 25.7 % — ABNORMAL LOW (ref 39.0–52.0)
HCT: 26.5 % — ABNORMAL LOW (ref 39.0–52.0)
Hemoglobin: 7.2 g/dL — ABNORMAL LOW (ref 13.0–17.0)
Hemoglobin: 8.2 g/dL — ABNORMAL LOW (ref 13.0–17.0)
Hemoglobin: 8.4 g/dL — ABNORMAL LOW (ref 13.0–17.0)
Hemoglobin: 8.5 g/dL — ABNORMAL LOW (ref 13.0–17.0)
MCH: 28.7 pg (ref 26.0–34.0)
MCH: 28.8 pg (ref 26.0–34.0)
MCH: 29.3 pg (ref 26.0–34.0)
MCH: 29.6 pg (ref 26.0–34.0)
MCHC: 31.2 g/dL (ref 30.0–36.0)
MCHC: 32.1 g/dL (ref 30.0–36.0)
MCHC: 32.5 g/dL (ref 30.0–36.0)
MCHC: 32.7 g/dL (ref 30.0–36.0)
MCV: 89.8 fL (ref 80.0–100.0)
MCV: 90 fL (ref 80.0–100.0)
MCV: 90.5 fL (ref 80.0–100.0)
MCV: 92 fL (ref 80.0–100.0)
Platelets: 101 10*3/uL — ABNORMAL LOW (ref 150–400)
Platelets: 106 10*3/uL — ABNORMAL LOW (ref 150–400)
Platelets: 112 10*3/uL — ABNORMAL LOW (ref 150–400)
Platelets: 98 10*3/uL — ABNORMAL LOW (ref 150–400)
RBC: 2.51 MIL/uL — ABNORMAL LOW (ref 4.22–5.81)
RBC: 2.8 MIL/uL — ABNORMAL LOW (ref 4.22–5.81)
RBC: 2.84 MIL/uL — ABNORMAL LOW (ref 4.22–5.81)
RBC: 2.95 MIL/uL — ABNORMAL LOW (ref 4.22–5.81)
RDW: 14.6 % (ref 11.5–15.5)
RDW: 14.6 % (ref 11.5–15.5)
RDW: 14.7 % (ref 11.5–15.5)
RDW: 14.8 % (ref 11.5–15.5)
WBC: 10 10*3/uL (ref 4.0–10.5)
WBC: 14.2 10*3/uL — ABNORMAL HIGH (ref 4.0–10.5)
WBC: 15.7 10*3/uL — ABNORMAL HIGH (ref 4.0–10.5)
WBC: 16.5 10*3/uL — ABNORMAL HIGH (ref 4.0–10.5)
nRBC: 0 % (ref 0.0–0.2)
nRBC: 0 % (ref 0.0–0.2)
nRBC: 0 % (ref 0.0–0.2)
nRBC: 0 % (ref 0.0–0.2)

## 2020-01-09 LAB — BPAM FFP
Blood Product Expiration Date: 202110202359
Blood Product Expiration Date: 202110202359
ISSUE DATE / TIME: 202110181131
ISSUE DATE / TIME: 202110181131
Unit Type and Rh: 5100
Unit Type and Rh: 5100

## 2020-01-09 LAB — PREPARE PLATELET PHERESIS: Unit division: 0

## 2020-01-09 LAB — BPAM CRYOPRECIPITATE
Blood Product Expiration Date: 202110191520
Blood Product Expiration Date: 202110191520
ISSUE DATE / TIME: 202110190953
ISSUE DATE / TIME: 202110190953
Unit Type and Rh: 5100
Unit Type and Rh: 6200

## 2020-01-09 LAB — PREPARE FRESH FROZEN PLASMA
Unit division: 0
Unit division: 0

## 2020-01-09 LAB — COOXEMETRY PANEL
Carboxyhemoglobin: 1 % (ref 0.5–1.5)
Carboxyhemoglobin: 0.9 % (ref 0.5–1.5)
Methemoglobin: 1 % (ref 0.0–1.5)
Methemoglobin: 0.7 % (ref 0.0–1.5)
O2 Saturation: 51.6 %
O2 Saturation: 45 %
Total hemoglobin: 8.3 g/dL — ABNORMAL LOW (ref 12.0–16.0)
Total hemoglobin: 8.1 g/dL — ABNORMAL LOW (ref 12.0–16.0)

## 2020-01-09 LAB — LACTATE DEHYDROGENASE: LDH: 236 U/L — ABNORMAL HIGH (ref 98–192)

## 2020-01-09 LAB — APTT
aPTT: 92 seconds — ABNORMAL HIGH (ref 24–36)
aPTT: 53 seconds — ABNORMAL HIGH (ref 24–36)

## 2020-01-09 LAB — ECHOCARDIOGRAM LIMITED
Height: 70 in
Weight: 2243.4 oz

## 2020-01-09 LAB — BPAM PLATELET PHERESIS
Blood Product Expiration Date: 202110192359
ISSUE DATE / TIME: 202110181131
Unit Type and Rh: 5100

## 2020-01-09 LAB — PREPARE RBC (CROSSMATCH)

## 2020-01-09 LAB — MAGNESIUM
Magnesium: 2.1 mg/dL (ref 1.7–2.4)
Magnesium: 2.1 mg/dL (ref 1.7–2.4)

## 2020-01-09 LAB — PROTIME-INR
INR: 1.4 — ABNORMAL HIGH (ref 0.8–1.2)
Prothrombin Time: 16.3 seconds — ABNORMAL HIGH (ref 11.4–15.2)

## 2020-01-09 LAB — HEPARIN LEVEL (UNFRACTIONATED): Heparin Unfractionated: <0.1 IU/mL — ABNORMAL LOW (ref 0.30–0.70)

## 2020-01-09 MED ORDER — METOCLOPRAMIDE HCL 5 MG/ML IJ SOLN
10.0000 mg | Freq: Four times a day (QID) | INTRAMUSCULAR | Status: DC
  Administered 2020-01-09 – 2020-01-11 (×7): 10 mg via INTRAVENOUS
  Filled 2020-01-09 (×7): qty 2

## 2020-01-09 MED ORDER — DIGOXIN 125 MCG PO TABS
0.1250 mg | ORAL_TABLET | Freq: Every day | ORAL | Status: DC
  Administered 2020-01-09: 0.125 mg via ORAL
  Filled 2020-01-09: qty 1

## 2020-01-09 MED ORDER — ALBUMIN HUMAN 5 % IV SOLN
INTRAVENOUS | Status: AC
  Administered 2020-01-09: 12.5 g via INTRAVENOUS
  Filled 2020-01-09: qty 250

## 2020-01-09 MED ORDER — SODIUM BICARBONATE 8.4 % IV SOLN
25.0000 meq | Freq: Once | INTRAVENOUS | Status: AC
  Administered 2020-01-09: 25 meq via INTRAVENOUS
  Filled 2020-01-09: qty 50

## 2020-01-09 MED ORDER — LIDOCAINE HCL (PF) 1 % IJ SOLN
INTRAMUSCULAR | Status: AC
  Administered 2020-01-09: 30 mL
  Filled 2020-01-09: qty 5

## 2020-01-09 MED ORDER — ALPRAZOLAM 0.25 MG PO TABS
0.2500 mg | ORAL_TABLET | Freq: Two times a day (BID) | ORAL | Status: DC | PRN
  Administered 2020-01-09 (×2): 0.25 mg via ORAL
  Filled 2020-01-09 (×2): qty 1

## 2020-01-09 MED ORDER — HEPARIN SODIUM (PORCINE) 5000 UNIT/ML IJ SOLN
25000.0000 [IU] | INTRAVENOUS | Status: DC
  Administered 2020-01-09: 25000 [IU]
  Filled 2020-01-09: qty 5

## 2020-01-09 MED ORDER — INSULIN ASPART 100 UNIT/ML ~~LOC~~ SOLN
0.0000 [IU] | SUBCUTANEOUS | Status: DC
  Administered 2020-01-09 – 2020-01-11 (×9): 2 [IU] via SUBCUTANEOUS
  Administered 2020-01-11: 4 [IU] via SUBCUTANEOUS

## 2020-01-09 MED ORDER — FENTANYL CITRATE (PF) 100 MCG/2ML IJ SOLN
INTRAMUSCULAR | Status: AC
  Administered 2020-01-09: 25 ug via INTRAVENOUS
  Filled 2020-01-09: qty 2

## 2020-01-09 MED ORDER — HEPARIN SODIUM (PORCINE) 5000 UNIT/ML IJ SOLN
25000.0000 [IU] | INTRAVENOUS | Status: DC
  Administered 2020-01-09 – 2020-01-10 (×2): 25000 [IU]
  Filled 2020-01-09 (×2): qty 5

## 2020-01-09 MED ORDER — FUROSEMIDE 10 MG/ML IJ SOLN: 40.0000 mg | Freq: Once | INTRAMUSCULAR | Status: DC

## 2020-01-09 MED ORDER — SODIUM BICARBONATE 8.4 % IV SOLN
50.0000 meq | Freq: Once | INTRAVENOUS | Status: AC
  Administered 2020-01-09: 50 meq via INTRAVENOUS

## 2020-01-09 MED ORDER — FUROSEMIDE 10 MG/ML IJ SOLN
40.0000 mg | Freq: Once | INTRAMUSCULAR | Status: AC
  Administered 2020-01-09: 40 mg via INTRAVENOUS
  Filled 2020-01-09: qty 4

## 2020-01-09 MED ORDER — SODIUM BICARBONATE 8.4 % IV SOLN
75.0000 meq | Freq: Once | INTRAVENOUS | Status: AC
  Administered 2020-01-09: 75 meq via INTRAVENOUS

## 2020-01-09 MED ORDER — SODIUM BICARBONATE 8.4 % IV SOLN
INTRAVENOUS | Status: AC
  Administered 2020-01-09: 50 meq via INTRAVENOUS
  Filled 2020-01-09: qty 50

## 2020-01-09 MED ORDER — CALCIUM CHLORIDE 10 % IV SOLN
1.0000 g | Freq: Once | INTRAVENOUS | Status: AC
  Administered 2020-01-09: 1 g via INTRAVENOUS

## 2020-01-09 MED ORDER — LIDOCAINE HCL (PF) 1 % IJ SOLN
INTRAMUSCULAR | Status: AC
  Filled 2020-01-09: qty 30

## 2020-01-09 MED ORDER — ALBUMIN HUMAN 5 % IV SOLN: 12.5000 g | Freq: Once | INTRAVENOUS | Status: AC

## 2020-01-09 MED ORDER — NOREPINEPHRINE 16 MG/250ML-% IV SOLN
0.0000 ug/min | INTRAVENOUS | Status: DC
  Administered 2020-01-09: 12 ug/min via INTRAVENOUS
  Administered 2020-01-10: 10 ug/min via INTRAVENOUS
  Administered 2020-01-11: 28 ug/min via INTRAVENOUS
  Administered 2020-01-12: 26 ug/min via INTRAVENOUS
  Administered 2020-01-12: 22 ug/min via INTRAVENOUS
  Administered 2020-01-12 – 2020-01-13 (×2): 32 ug/min via INTRAVENOUS
  Filled 2020-01-09 (×13): qty 250

## 2020-01-09 MED ORDER — FAMOTIDINE IN NACL 20-0.9 MG/50ML-% IV SOLN
20.0000 mg | Freq: Two times a day (BID) | INTRAVENOUS | Status: AC
  Administered 2020-01-09: 20 mg via INTRAVENOUS
  Filled 2020-01-09 (×2): qty 50

## 2020-01-09 MED ORDER — SODIUM BICARBONATE 8.4 % IV SOLN
INTRAVENOUS | Status: AC
  Administered 2020-01-09: 50 meq
  Filled 2020-01-09: qty 50

## 2020-01-09 MED ORDER — FUROSEMIDE 10 MG/ML IJ SOLN
INTRAMUSCULAR | Status: AC
  Administered 2020-01-09: 20 mg via INTRAVENOUS
  Filled 2020-01-09: qty 2

## 2020-01-09 MED ORDER — SODIUM CHLORIDE 0.9% FLUSH: 10.0000 mL | INTRAVENOUS | Status: DC | PRN

## 2020-01-09 MED ORDER — SODIUM CHLORIDE 0.9% IV SOLUTION: Freq: Once | INTRAVENOUS | Status: AC

## 2020-01-09 MED ORDER — AMIODARONE HCL 200 MG PO TABS
200.0000 mg | ORAL_TABLET | Freq: Two times a day (BID) | ORAL | Status: DC
  Administered 2020-01-09 – 2020-01-10 (×3): 200 mg via ORAL
  Filled 2020-01-09 (×3): qty 1

## 2020-01-09 MED ORDER — FUROSEMIDE 10 MG/ML IJ SOLN: 20.0000 mg | Freq: Once | INTRAMUSCULAR | Status: AC

## 2020-01-09 MED ORDER — SODIUM CHLORIDE 0.9% FLUSH
10.0000 mL | Freq: Two times a day (BID) | INTRAVENOUS | Status: DC
  Administered 2020-01-09 (×2): 10 mL
  Administered 2020-01-09: 40 mL
  Administered 2020-01-10 – 2020-01-13 (×4): 10 mL

## 2020-01-09 MED ORDER — FENTANYL CITRATE (PF) 100 MCG/2ML IJ SOLN: 25.0000 ug | Freq: Once | INTRAMUSCULAR | Status: AC

## 2020-01-09 MED ORDER — FUROSEMIDE 10 MG/ML IJ SOLN
20.0000 mg | Freq: Two times a day (BID) | INTRAMUSCULAR | Status: DC
  Administered 2020-01-09 – 2020-01-10 (×2): 20 mg via INTRAVENOUS
  Filled 2020-01-09 (×3): qty 2

## 2020-01-09 MED ORDER — SODIUM BICARBONATE 8.4 % IV SOLN
50.0000 meq | Freq: Once | INTRAVENOUS | Status: AC
  Filled 2020-01-09: qty 50

## 2020-01-09 NOTE — Op Note (Signed)
Procedure  Informed consent for a left chest tube was obtained after morning x-ray shows accumulation of left pleural effusion associated with postoperative coagulopathy.  The left chest was prepped and draped as a sterile field. A proper timeout was performed. Lidocaine 1% was infiltrated in the fifth interspace at the anterior axillary line Small incision was made at that point The left pleural space was entered with a hemostat drainage of serosanguineous fluid 20 French chest tube was positioned posteriorly in the left pleural space secured with a silk suture and connected to a Pleur-evac drain system Patient tolerated the procedure well. Follow-up chest x-ray showed the tube in good position with improvement in the left lower lung field effusion

## 2020-01-09 NOTE — Hospital Course (Addendum)
History of Present Illness:  Adrian Phillips is a 69 yo AA male with known history of Diverticulosis, Hyperlipidemia, HTN, tobacco abuse, and ETOH use, and PVD S/P Right sided femoral popliteal bypass on Plavix, using saphenous vein from his right leg.  He presented to the ED on 01/17/2020 with acute onset shortness of breath.  He was brought in via EMS to [facility].  Workup in the ED revealed lower extremity edema on physical exam.  EKG was obtained and showed Sinus Tachycardia.  He was also noted to be hypertensive and hypoxic.  He was started on NTG drip which was discontinued due to subsequent hypotension.  He was also treated with IV Lasix.  He finally had an echocardiogram which showed a reduced EF of <20%.  He was transferred to [facility] for further care.    Hospital Course:  The patient remained on diuretic therapy.  He was ruled in for NSTEMI and was placed on Heparin drip. He underwent cardiac catheterization on 12/30/2019 which showed multivessel CAD.  There was some concern of cardiac viability.  MRI was recommended and this showed moderate LVE with diffuse hypokinesis worse in the septum and apex.  There was also evidence of scar in the mid distal anterior wall, septum, and apex.  There was no full thickness or transmural scar present.  It was felt the patient would benefit from coronary bypass grafting and Cardiothoracic consultation was requested.  He was evaluated by Dr. Donata Clay who worked in Coordination with the Advanced heart failure team.  It was felt the patient would best be served with coronary bypass grafting with likely mechanical assist.  The risks and benefits of the procedure were explained to the patient and he was agreeable to proceed.  He underwent PICC line therapy and was started on Milrinone therapy.  His advance heart failure was aggressively managed with addition of digoxin, lasix and spironolactone prior to surgery.  He developed some ventricular ectopy and was also  started on Amiodarone.  The patient's cardiac output increased and he was felt medically optimized to proceed with surgery on 01/09/2020.  He was taken to the operating room and underwent CABG x 3 utilizing LIMA to LAD, SVG to PDA, and SVG to OM.  He also underwent placement of Impella percutaneous LVAD.  Finally he underwent endoscopic harvest of greater saphenous vein from his left leg.  He tolerated the procedure but required pressor support post wean from cardiopulmonary bypass.  He was taken to the SICU in stable condition.   He was extubated the evening of surgery.  He required Neo-synephrine, Vasopressin and Milrinone for support.  Post operative CXR showed a new enlarging left sided pleural effusion.  He required placement of left side chest tube  to drain bloody pleural effusion which improved appearance of left sided pleural effusion.  He had expected post operative blood loss anemia and required transfusion of additional packed cells and FFP.  He had an Echocardiogram performed which showed improved LV function with Impella optimally positioned. Unfortunately the patient's hemodynamics worsened with a drop in his hemoglobin level.  There was concern for a delayed bleed and he was taken back to the operating room on 01/04/2020 for mediastinal exploration with evacuation of hematoma for tamponade.  He also underwent placement of a dialysis catheter.  He tolerated the procedure without difficulty and was taken to the SICU in stable condition.  Post operatively the patient developed Hyperkalemia at 7.5.  This resulted in a PEA arrest.  CPR was initiated and his potassium level was treated.  The patient was successfully resuscitated.  Emergent Nephrology consult was obtained and the patient was initiated on dialysis.  The patients potassium level improved.  He developed markedly elevated liver enzymes.  Due to this tube feedings were not initiated due to suspected mesenteric hypoperfusion.  The patient had  an elevated procalcitonin level and was started on Vancomycin, Cefipime prophylaxis.

## 2020-01-09 NOTE — Plan of Care (Signed)
  Problem: Education: Goal: Will demonstrate proper wound care and an understanding of methods to prevent future damage Outcome: Progressing Goal: Knowledge of disease or condition will improve Outcome: Progressing Goal: Knowledge of the prescribed therapeutic regimen will improve Outcome: Progressing   Problem: Activity: Goal: Risk for activity intolerance will decrease Outcome: Progressing   Problem: Cardiac: Goal: Will achieve and/or maintain hemodynamic stability Outcome: Progressing

## 2020-01-09 NOTE — Progress Notes (Signed)
ANTICOAGULATION CONSULT NOTE  Pharmacy Consult for heparin Indication: Impella  Allergies  Allergen Reactions  . Lisinopril Swelling    Angioedema in 2020    Patient Measurements: Height: 5\' 10"  (177.8 cm) Weight: 63.6 kg (140 lb 3.4 oz) IBW/kg (Calculated) : 73 Heparin Dosing Weight: 56kg  Vital Signs: Temp: 99 F (37.2 C) (10/19 1900) BP: 91/75 (10/19 1900) Pulse Rate: 103 (10/19 1830)  Labs: Recent Labs    01/07/20 0404 12/30/2019 0400 01/06/2020 1502 01/10/2020 2122 01/21/2020 2211 01/10/2020 2219 01/12/2020 2306 01/09/20 0502 01/09/20 0607 01/09/20 1415 01/09/20 1415 01/09/20 1510 01/09/20 1510 01/09/20 1738 01/09/20 1743  HGB 13.6   < > 10.2*  9.5*   < >  --   --    < > 8.5*   < > 8.4*   < > 7.8*   < > 8.2* 7.8*  HCT 42.9   < > 30.0*  30.7*   < >  --   --    < > 26.5*   < > 25.7*   < > 23.0*  --  25.2* 23.0*  PLT 236   < > 117*   < >  --   --    < > 98*  --  101*  --   --   --  106*  --   APTT  --   --  35  --   --   --   --  92*  --   --   --   --   --  53*  --   LABPROT  --   --  18.0*  --  16.3*  --   --   --   --   --   --   --   --   --   --   INR  --   --  1.5*  --  1.4*  --   --   --   --   --   --   --   --   --   --   HEPARINUNFRC 0.36  --   --   --   --  0.29*  --   --   --   --   --   --   --  <0.10*  --   CREATININE 1.06   < >  --    < >  --   --    < > 1.15  --  1.71*  --   --   --  1.82*  --    < > = values in this interval not displayed.    Estimated Creatinine Clearance: 34.5 mL/min (A) (by C-G formula based on SCr of 1.82 mg/dL (H)).   Medical History: Past Medical History:  Diagnosis Date  . Diverticulosis   . Hyperlipidemia   . Hypertension   . Internal hemorrhoids   . PVD (peripheral vascular disease) (HCC)     Assessment: 66 yoM admitted with NSTEMI started on IV heparin now s/p CABG with Impella placement. Pharmacy following along anticoagulation plans. Heparin currently in purge solution only. APTT this evening is 53 seconds.  Heparinized purge at lower concentration (25 units/ml).   Goal of Therapy:  Heparin level 0.2-0.5 units/ml Monitor platelets by anticoagulation protocol: Yes   Plan:  -Heparinized purge (25 units/ml)  -Will follow q12h heparin level and aPTT -No systemic heparin for now  77 PharmD., BCPS Clinical Pharmacist 01/09/2020 7:43 PM

## 2020-01-09 NOTE — Progress Notes (Addendum)
ANTICOAGULATION CONSULT NOTE  Pharmacy Consult for heparin Indication: Impella  Allergies  Allergen Reactions  . Lisinopril Swelling    Angioedema in 2020    Patient Measurements: Height: 5\' 10"  (177.8 cm) Weight: 63.6 kg (140 lb 3.4 oz) IBW/kg (Calculated) : 73 Heparin Dosing Weight: 56kg  Vital Signs: Temp: 99.5 F (37.5 C) (10/19 0700) BP: 95/79 (10/19 0700) Pulse Rate: 106 (10/19 0700)  Labs: Recent Labs    01/07/20 0404 12/23/2019 0400 12/30/2019 1308 01/13/2020 1402 01/19/2020 1502 12/31/2019 1502 01/21/2020 2122 12/30/2019 2127 01/16/2020 2211 12/25/2019 2219 01/17/2020 2306 01/09/20 0006 01/09/20 0006 01/09/20 0502 01/09/20 0607  HGB 13.6   < > 7.1*  7.5*   < > 10.2*  9.5*   < > 8.1*   < >  --   --    < > 7.2*   < > 8.5* 7.8*  HCT 42.9   < > 21.0*  22.0*   < > 30.0*  30.7*   < > 25.8*   < >  --   --    < > 23.1*  --  26.5* 23.0*  PLT 236   < >  --    < > 117*   < > 118*  --   --   --   --  112*  --  98*  --   APTT  --   --   --   --  35  --   --   --   --   --   --   --   --  92*  --   LABPROT  --   --   --   --  18.0*  --   --   --  16.3*  --   --   --   --   --   --   INR  --   --   --   --  1.5*  --   --   --  1.4*  --   --   --   --   --   --   HEPARINUNFRC 0.36  --   --   --   --   --   --   --   --  0.29*  --   --   --   --   --   CREATININE 1.06   < > 0.90  --   --   --  1.02  --   --   --   --   --   --  1.15  --    < > = values in this interval not displayed.    Estimated Creatinine Clearance: 54.5 mL/min (by C-G formula based on SCr of 1.15 mg/dL).   Medical History: Past Medical History:  Diagnosis Date  . Diverticulosis   . Hyperlipidemia   . Hypertension   . Internal hemorrhoids   . PVD (peripheral vascular disease) (HCC)     Assessment: 74 yoM admitted with NSTEMI started on IV heparin now s/p CABG with Impella placement. Pharmacy following along anticoagulation plans. Heparin currently in purge solution only. APTT this morning 92 seconds, pt  developed bleeding. Heparinized purge held, then resumed with lower concentration (25 units/ml) at 1300.  Goal of Therapy:  Heparin level 0.2-0.5 units/ml Monitor platelets by anticoagulation protocol: Yes   Plan:  -Heparinized purge (25 units/ml) to start at 1300 -Will follow q12h heparin level and aPTT -No systemic heparin for now   77,  PharmD, BCPS Clinical Pharmacist [phone] Please check AMION for all [facility] Pharmacy numbers 01/09/2020

## 2020-01-09 NOTE — Progress Notes (Signed)
Patient ID: Adrian Phillips, male   DOB: 03/21/1951, 68 y.o.   MRN: [id number]     Advanced Heart Failure Rounding Note  PCP-Cardiologist: Peter Nishan, MD   Subjective:    CABG (LIMA-LAD, SVG-OM1, SVG-dCFx) + Impella 5.5 on 10/18.   This morning, he is extubated and on NE 10, vasopressin 0.03, milrinone 0.25.  Good UOP overnight though beginning to slow. MAP around 65, in NSR. Tm 100.4.   He complains of back discomfort, anxiety, and pain at surgical site.   Impella:  P7, good waveforms with no alarms.  Flow 3.7 L/min LDH 236  Swan: CVP 5 PA 34/14 CI 2.3  Cardiac MRI:  1. Moderate LVE with diffuse hypokinesis worse in the septum and apex EF 26% 2. Post gadolinium images with sub-endocardial scar in the mid distal anterior wall, septum and apex. No transmural or full thickness scar suggesting possibility of LVEF recovery with revascularization.    Objective:   Weight Range: 63.6 kg Body mass index is 20.12 kg/m.   Vital Signs:   Temp:  [96.8 F (36 C)-100.4 F (38 C)] 99.5 F (37.5 C) (10/19 0700) Pulse Rate:  [82-111] 106 (10/19 0700) Resp:  [0-40] 39 (10/19 0700) BP: (85-148)/(59-102) 95/79 (10/19 0700) SpO2:  [90 %-100 %] 95 % (10/19 0700) Arterial Line BP: (73-176)/(48-95) 91/63 (10/19 0700) FiO2 (%):  [40 %-50 %] 40 % (10/18 2103) Weight:  [63.6 kg] 63.6 kg (10/19 0618) Last BM Date: 01/06/20  Weight change: Filed Weights   01/07/20 0456  0500 01/09/20 0618  Weight: 55.3 kg 55.8 kg 63.6 kg    Intake/Output:   Intake/Output Summary (Last 24 hours) at 01/09/2020 0807 Last data filed at 01/09/2020 0700 Gross per 24 hour  Intake 6008.56 ml  Output 7510 ml  Net -1501.44 ml      Physical Exam    General: NAD Neck: Swan in place. No JVD, no thyromegaly or thyroid nodule.  Lungs: Clear to auscultation bilaterally with normal respiratory effort. CV: Nondisplaced PMI.  Heart regular S1/S2, no S3/S4, no murmur.  No peripheral edema.   Abdomen: Soft, nontender, no hepatosplenomegaly, no distention.  Skin: Intact without lesions or rashes.  Neurologic: Alert and oriented x 3.  Psych: Normal affect. Extremities: No clubbing or cyanosis.  HEENT: Normal.    Telemetry   NSR 100, Personally reviewed   Labs    CBC Recent Labs    01/09/20 0006 01/09/20 0006 01/09/20 0502 01/09/20 0607  WBC 10.0  --  14.2*  --   HGB 7.2*   < > 8.5* 7.8*  HCT 23.1*   < > 26.5* 23.0*  MCV 92.0  --  89.8  --   PLT 112*  --  98*  --    < > = values in this interval not displayed.   Basic Metabolic Panel Recent Labs    01/17/2020 2122 01/03/2020 2127 01/09/20 0502 01/09/20 0607  NA 139   < > 138 140  K 5.1   < > 5.3* 5.4*  CL 111  --  110  --   CO2 21*  --  18*  --   GLUCOSE 155*  --  130*  --   BUN 14  --  14  --   CREATININE 1.02  --  1.15  --   CALCIUM 8.5*  --  8.1*  --   MG 2.6*  --  2.1  --    < > = values in this interval not displayed.     Liver Function Tests No results for input(s): AST, ALT, ALKPHOS, BILITOT, PROT, ALBUMIN in the last 72 hours. No results for input(s): LIPASE, AMYLASE in the last 72 hours. Cardiac Enzymes No results for input(s): CKTOTAL, CKMB, CKMBINDEX, TROPONINI in the last 72 hours.  BNP: BNP (last 3 results) Recent Labs    12/26/2019 0151  BNP 269.4*    ProBNP (last 3 results) No results for input(s): PROBNP in the last 8760 hours.   D-Dimer No results for input(s): DDIMER in the last 72 hours. Hemoglobin A1C No results for input(s): HGBA1C in the last 72 hours. Fasting Lipid Panel No results for input(s): CHOL, HDL, LDLCALC, TRIG, CHOLHDL, LDLDIRECT in the last 72 hours. Thyroid Function Tests No results for input(s): TSH, T4TOTAL, T3FREE, THYROIDAB in the last 72 hours.  Invalid input(s): FREET3  Other results:   Imaging    DG Chest Port 1 View  Result Date: 01/09/2020 CLINICAL DATA:  CABG. EXAM: PORTABLE CHEST 1 VIEW COMPARISON:  Yesterday FINDINGS: Extubation with  lower lung volumes. Chest tubes in stable position. Stable ventricular assist device positioning. Right PICC in unremarkable position. Swan-Ganz catheter with tip at the pulmonary artery outflow. New moderate left pleural effusion. Interstitial opacity which is diffuse. No visible pneumothorax CABG with similar appearing heart size. IMPRESSION: 1. Lower volumes after extubation. 2. Pulmonary edema with a new left pleural effusion. Electronically Signed   By: Jonathon  Watts M.D.   On: 01/09/2020 06:16   DG Chest Portable 1 View  Result Date: 01/17/2020 CLINICAL DATA:  Follow-up open heart surgery EXAM: PORTABLE CHEST 1 VIEW COMPARISON:  01/18/2020 FINDINGS: Interval median sternotomy and CABG. Endotracheal tube tip 6 cm above the carina. Swan-Ganz catheter placed from a right femoral approach has its tip in the main pulmonary artery. Bilateral chest tubes. No pneumothorax. Left heart Impella in place. Mild interstitial prominence that could be a combination of chronic interstitial disease and mild interstitial edema. No advanced edema. No effusion. IMPRESSION: Interval CABG. Lines and tubes well positioned. No pneumothorax. Mild interstitial prominence that could be a combination of chronic interstitial disease and mild interstitial edema. Electronically Signed   By: Mark  Shogry M.D.   On: 12/30/2019 14:33     Medications:     Scheduled Medications: . sodium chloride   Intravenous Once  . acetaminophen  1,000 mg Oral Q6H   Or  . acetaminophen (TYLENOL) oral liquid 160 mg/5 mL  1,000 mg Per Tube Q6H  . aspirin EC  325 mg Oral Daily   Or  . aspirin  324 mg Per Tube Daily  . atorvastatin  80 mg Oral Daily  . bisacodyl  10 mg Oral Daily   Or  . bisacodyl  10 mg Rectal Daily  . chlorhexidine gluconate (MEDLINE KIT)  15 mL Mouth Rinse BID  . Chlorhexidine Gluconate Cloth  6 each Topical Daily  . docusate sodium  200 mg Oral Daily  . furosemide  40 mg Intravenous Once  . influenza vaccine  adjuvanted  0.5 mL Intramuscular Tomorrow-1000  . metoCLOPramide (REGLAN) injection  10 mg Intravenous Q6H  . [START ON 01/10/2020] pantoprazole  40 mg Oral Daily  . sodium chloride flush  10-40 mL Intracatheter Q12H  . sodium chloride flush  3 mL Intravenous Q12H  . thiamine  100 mg Oral Daily   Or  . thiamine  100 mg Intravenous Daily    Infusions: . sodium chloride 20 mL/hr at 01/09/20 0700  . sodium chloride    . sodium chloride   20 mL/hr at 01/11/2020 1455  . sodium chloride 20 mL/hr at 01/09/20 0700  . cefTRIAXone (ROCEPHIN)  IV Stopped (01/16/2020 2348)  . dexmedetomidine (PRECEDEX) IV infusion Stopped (01/09/20 0550)  . epinephrine    . famotidine (PEPCID) IV Stopped (01/20/2020 1529)  . impella catheter heparin 50 unit/mL in dextrose 5%    . insulin 0.9 mL/hr at 01/09/20 0700  . milrinone 0.25 mcg/kg/min (01/09/20 0700)  . nitroGLYCERIN Stopped (01/09/2020 1928)  . norepinephrine (LEVOPHED) Adult infusion 10 mcg/min (01/09/20 0700)  . phenylephrine (NEO-SYNEPHRINE) Adult infusion 0 mcg/min (01/01/2020 1455)  . vasopressin 0.03 Units/min (01/09/20 0700)    PRN Medications: sodium chloride, dextrose, metoprolol tartrate, midazolam, morphine injection, ondansetron (ZOFRAN) IV, oxyCODONE, sodium chloride flush, sodium chloride flush, traMADol     Assessment/Plan   1. CAD: NSTEMI with cath showing totally occluded RCA with collaterals from LAD, 95% proximal LAD, 80% OM1 and 80% OM2.  Suspect he would be best-served by CABG.  Cardiac MRI showed significant viability, would expect to see improvement in LV function with revascularization. Now s/p CABG with LIMA-LAD, SVG-OM1, SVG-dCFX.  - Continue ASA 81 daily.  - Continue atorvastatin 80 mg daily.  2. Acute systolic CHF/post-op cardiogenic shock: Ischemic cardiomyopathy.  Echo this admission with EF < 20%, normal RV.  RHC showed CI mildly decreased at 1.93 with mildly elevated PCWP and normal RA pressure.  Pre-op cardiac MRI shows  significant viability, would expect to see improved LV function with revascularization.  Now post-CABG with Impella 5.5 in place functioning normally.  CI 2.3 this morning with CVP 5.  He remains on NE 10, milrinone 0.25, vasopressin 0.03 with MAP 65.  Good UOP so far but beginning to taper off, creatinine stable.  - Continue Impella at P7, heparin only in purge currently.  Will assess position under echo.  - Continue milrinone 0.25.   - Wean pressors (vasopressin and NE) as MAP tolerates.  - Will give Lasix 40 mg IV x 1 later this afternoon.  - Can restart digoxin 0.125 daily.  - Would not retry Entresto given history of angioedema with ACEI, eventually to ARB.  3. NSVT: Noted runs this admission.  No ectopy noted now that he is on amiodarone.  - Continue amiodarone 200 mg bid peri-operatively.  4. Active smoker: Needs to quit, counseled.  5. PAD: h/o right fem-pop bypass.  6. Carotid stenosis: Asymptomatic 60-79% RICA stenosis.  7. ID: Low grade fever to 100.4 post-op, follow.  8. Anemia: Post-op blood loss. Transfuse Hgb < 8.   CRITICAL CARE Performed by:    Total critical care time: 35 minutes  Critical care time was exclusive of separately billable procedures and treating other patients.  Critical care was necessary to treat or prevent imminent or life-threatening deterioration.  Critical care was time spent personally by me on the following activities: development of treatment plan with patient and/or surrogate as well as nursing, discussions with consultants, evaluation of patient's response to treatment, examination of patient, obtaining history from patient or surrogate, ordering and performing treatments and interventions, ordering and review of laboratory studies, ordering and review of radiographic studies, pulse oximetry and re-evaluation of patient's condition.     01/09/2020 8:07 AM  

## 2020-01-09 NOTE — Progress Notes (Signed)
1 Day Post-Op Procedure(s) (LRB): CORONARY ARTERY BYPASS GRAFTING (CABG) x 3  USING LEFT INTERNAL MAMMARY ARTERY AND LEFT GREATER SAPHENOUS VEIN. LIMA TO LAD, SVG TO OM1, SVG TO OM2 (N/A) PLACEMENT OF IMPELLA LEFT VENTRICULAR ASSIST DEVICE 5.5 SN [id number] (N/A) TRANSESOPHAGEAL ECHOCARDIOGRAM (TEE) (N/A) ENDOVEIN HARVEST OF GREATER SAPHENOUS VEIN (Left) Subjective: Venous bleeding into L pleural space started early am after extubation- PTT 92 from heparin in Impella purge Required increased pressors to maintain MAP causing acidosis Another L chest tube placed, 2 units PRBC given 2 FFP ABG with metabolic acidosis despite adequate Impella flpw   Correcting with transfusion, bicarb  Objective: Vital signs in last 24 hours: Temp:  [96.8 F (36 C)-100.4 F (38 C)] 99.5 F (37.5 C) (10/19 0700) Pulse Rate:  [82-111] 106 (10/19 0700) Cardiac Rhythm: Normal sinus rhythm (10/19 0400) Resp:  [0-40] 39 (10/19 0700) BP: (85-148)/(59-102) 95/79 (10/19 0700) SpO2:  [90 %-100 %] 95 % (10/19 0700) Arterial Line BP: (73-176)/(48-95) 91/63 (10/19 0700) FiO2 (%):  [40 %-50 %] 40 % (10/18 2103) Weight:  [63.6 kg] 63.6 kg (10/19 0618)  Hemodynamic parameters for last 24 hours: PAP: (23-43)/(11-25) 29/17 CVP:  [3 mmHg-15 mmHg] 10 mmHg CO:  [2.9 L/min-4.5 L/min] 4 L/min CI:  [1.7 L/min/m2-2.6 L/min/m2] 2.3 L/min/m2  Intake/Output from previous day: 10/18 0701 - 10/19 0700 In: 6358.6 [I.V.:2268.3; Blood:2505; IV Piggyback:1375.4] Out: 7510 [Urine:4060; Blood:1800; Chest Tube:1650] Intake/Output this shift: Total I/O In: 91.4 [I.V.:91.4] Out: 60 [Urine:10; Chest Tube:50]  EXAM Responsive but anxious decrease BS L base Impella secure  Lab Results: Recent Labs    01/09/20 0006 01/09/20 0006 01/09/20 0502 01/09/20 0607  WBC 10.0  --  14.2*  --   HGB 7.2*   < > 8.5* 7.8*  HCT 23.1*   < > 26.5* 23.0*  PLT 112*  --  98*  --    < > = values in this interval not displayed.   BMET:  Recent  Labs    01/21/2020 2122 01/18/2020 2127 01/09/20 0502 01/09/20 0607  NA 139   < > 138 140  K 5.1   < > 5.3* 5.4*  CL 111  --  110  --   CO2 21*  --  18*  --   GLUCOSE 155*  --  130*  --   BUN 14  --  14  --   CREATININE 1.02  --  1.15  --   CALCIUM 8.5*  --  8.1*  --    < > = values in this interval not displayed.    PT/INR:  Recent Labs    01/06/2020 2211  LABPROT 16.3*  INR 1.4*   ABG    Component Value Date/Time   PHART 7.336 (L) 01/09/2020 0607   HCO3 17.7 (L) 01/09/2020 0607   TCO2 19 (L) 01/09/2020 0607   ACIDBASEDEF 7.0 (H) 01/09/2020 0607   O2SAT 96.0 01/09/2020 0607   CBG (last 3)  Recent Labs    01/09/20 0706 01/09/20 0754 01/09/20 0945  GLUCAP 134* 143* 128*    Assessment/Plan: S/P Procedure(s) (LRB): CORONARY ARTERY BYPASS GRAFTING (CABG) x 3  USING LEFT INTERNAL MAMMARY ARTERY AND LEFT GREATER SAPHENOUS VEIN. LIMA TO LAD, SVG TO OM1, SVG TO OM2 (N/A) PLACEMENT OF IMPELLA LEFT VENTRICULAR ASSIST DEVICE 5.5 SN [id number] (N/A) TRANSESOPHAGEAL ECHOCARDIOGRAM (TEE) (N/A) ENDOVEIN HARVEST OF GREATER SAPHENOUS VEIN (Left) Hold anticoagulation Follow PTT bid Echocardiogram ordered to assess impella, postop tamponade   LOS: 8 days    Kathlee Nations  Trigt III 01/09/2020

## 2020-01-09 NOTE — Progress Notes (Signed)
Anesthesiology Follow-up:  Awake and alert, neuro intact, in good spirits. As noted patient developed L. Pleural space bleeding following extubation early this morning. New L. chest tube inserted,  given 2 units PRBCs and 2 units FFP.  VS: T- 36.8 BP- 101/82 HR-98 (SR) RR-19  PA 33/15 CVP-13 CO/CI- 4/2.3  Milrinone 0.25 mcg/kg/min Norepi: 8 mcg/min Vasopressin 0.03 units/min  PH-7.36  PCO2-43.6 PO2-215  K-5.3 H/H-7.8/23.0 BUN/Cr 18/1.71  Impella: P7 Power: 382/308 Flow: 3.9 LPM  As noted 69 year old male with severe ischemic CM and Non-Stemi, underwent CABG X 3 with Impella insertion yesterday. Stable intra-op course, patient extubated 8 hours post-op. Developed bleeding into L. Pleural space following extubation. Acidosis now corrected, appears stable on pressors.  Creatinine 1.71 up from baseline 1.04. Plan diuresis as tolerated.  Kipp Brood

## 2020-01-09 NOTE — Progress Notes (Signed)
  Echocardiogram 2D Echocardiogram has been performed.  Celene Skeen 01/09/2020, 10:27 AM

## 2020-01-10 ENCOUNTER — Inpatient Hospital Stay (HOSPITAL_COMMUNITY): Payer: No Typology Code available for payment source

## 2020-01-10 DIAGNOSIS — R570 Cardiogenic shock: Secondary | ICD-10-CM | POA: Diagnosis not present

## 2020-01-10 DIAGNOSIS — I5021 Acute systolic (congestive) heart failure: Secondary | ICD-10-CM | POA: Diagnosis not present

## 2020-01-10 DIAGNOSIS — J9601 Acute respiratory failure with hypoxia: Secondary | ICD-10-CM | POA: Diagnosis not present

## 2020-01-10 DIAGNOSIS — J441 Chronic obstructive pulmonary disease with (acute) exacerbation: Secondary | ICD-10-CM

## 2020-01-10 DIAGNOSIS — Z95811 Presence of heart assist device: Secondary | ICD-10-CM | POA: Diagnosis not present

## 2020-01-10 LAB — POCT I-STAT 7, (LYTES, BLD GAS, ICA,H+H)
Acid-base deficit: 1 mmol/L (ref 0.0–2.0)
Acid-base deficit: 5 mmol/L — ABNORMAL HIGH (ref 0.0–2.0)
Acid-base deficit: 10 mmol/L — ABNORMAL HIGH (ref 0.0–2.0)
Acid-base deficit: 4 mmol/L — ABNORMAL HIGH (ref 0.0–2.0)
Bicarbonate: 23.8 mmol/L (ref 20.0–28.0)
Bicarbonate: 21.1 mmol/L (ref 20.0–28.0)
Bicarbonate: 17.1 mmol/L — ABNORMAL LOW (ref 20.0–28.0)
Bicarbonate: 20.9 mmol/L (ref 20.0–28.0)
Calcium, Ion: 1.14 mmol/L — ABNORMAL LOW (ref 1.15–1.40)
Calcium, Ion: 1.08 mmol/L — ABNORMAL LOW (ref 1.15–1.40)
Calcium, Ion: 1.07 mmol/L — ABNORMAL LOW (ref 1.15–1.40)
Calcium, Ion: 1.03 mmol/L — ABNORMAL LOW (ref 1.15–1.40)
HCT: 25 % — ABNORMAL LOW (ref 39.0–52.0)
HCT: 25 % — ABNORMAL LOW (ref 39.0–52.0)
HCT: 23 % — ABNORMAL LOW (ref 39.0–52.0)
HCT: 21 % — ABNORMAL LOW (ref 39.0–52.0)
Hemoglobin: 8.5 g/dL — ABNORMAL LOW (ref 13.0–17.0)
Hemoglobin: 8.5 g/dL — ABNORMAL LOW (ref 13.0–17.0)
Hemoglobin: 7.8 g/dL — ABNORMAL LOW (ref 13.0–17.0)
Hemoglobin: 7.1 g/dL — ABNORMAL LOW (ref 13.0–17.0)
O2 Saturation: 91 %
O2 Saturation: 86 %
O2 Saturation: 89 %
O2 Saturation: 91 %
Patient temperature: 37.4
Patient temperature: 37
Patient temperature: 37
Patient temperature: 37.5
Potassium: 5.1 mmol/L (ref 3.5–5.1)
Potassium: 4.9 mmol/L (ref 3.5–5.1)
Potassium: 4.6 mmol/L (ref 3.5–5.1)
Potassium: 4.4 mmol/L (ref 3.5–5.1)
Sodium: 142 mmol/L (ref 135–145)
Sodium: 143 mmol/L (ref 135–145)
Sodium: 142 mmol/L (ref 135–145)
Sodium: 143 mmol/L (ref 135–145)
TCO2: 25 mmol/L (ref 22–32)
TCO2: 22 mmol/L (ref 22–32)
TCO2: 18 mmol/L — ABNORMAL LOW (ref 22–32)
TCO2: 22 mmol/L (ref 22–32)
pCO2 arterial: 41.9 mmHg (ref 32.0–48.0)
pCO2 arterial: 42.6 mmHg (ref 32.0–48.0)
pCO2 arterial: 41.8 mmHg (ref 32.0–48.0)
pCO2 arterial: 37.4 mmHg (ref 32.0–48.0)
pH, Arterial: 7.364 (ref 7.350–7.450)
pH, Arterial: 7.302 — ABNORMAL LOW (ref 7.350–7.450)
pH, Arterial: 7.219 — ABNORMAL LOW (ref 7.350–7.450)
pH, Arterial: 7.356 (ref 7.350–7.450)
pO2, Arterial: 64 mmHg — ABNORMAL LOW (ref 83.0–108.0)
pO2, Arterial: 57 mmHg — ABNORMAL LOW (ref 83.0–108.0)
pO2, Arterial: 66 mmHg — ABNORMAL LOW (ref 83.0–108.0)
pO2, Arterial: 64 mmHg — ABNORMAL LOW (ref 83.0–108.0)

## 2020-01-10 LAB — CBC
HCT: 28.7 % — ABNORMAL LOW (ref 39.0–52.0)
HCT: 27.2 % — ABNORMAL LOW (ref 39.0–52.0)
HCT: 26.7 % — ABNORMAL LOW (ref 39.0–52.0)
Hemoglobin: 9.4 g/dL — ABNORMAL LOW (ref 13.0–17.0)
Hemoglobin: 9.2 g/dL — ABNORMAL LOW (ref 13.0–17.0)
Hemoglobin: 8.8 g/dL — ABNORMAL LOW (ref 13.0–17.0)
MCH: 28.9 pg (ref 26.0–34.0)
MCH: 30.2 pg (ref 26.0–34.0)
MCH: 30.6 pg (ref 26.0–34.0)
MCHC: 32.8 g/dL (ref 30.0–36.0)
MCHC: 33.8 g/dL (ref 30.0–36.0)
MCHC: 33 g/dL (ref 30.0–36.0)
MCV: 88.3 fL (ref 80.0–100.0)
MCV: 89.2 fL (ref 80.0–100.0)
MCV: 92.7 fL (ref 80.0–100.0)
Platelets: 100 10*3/uL — ABNORMAL LOW (ref 150–400)
Platelets: 87 10*3/uL — ABNORMAL LOW (ref 150–400)
Platelets: 60 10*3/uL — ABNORMAL LOW (ref 150–400)
RBC: 3.25 MIL/uL — ABNORMAL LOW (ref 4.22–5.81)
RBC: 3.05 MIL/uL — ABNORMAL LOW (ref 4.22–5.81)
RBC: 2.88 MIL/uL — ABNORMAL LOW (ref 4.22–5.81)
RDW: 14.6 % (ref 11.5–15.5)
RDW: 14.8 % (ref 11.5–15.5)
RDW: 15.1 % (ref 11.5–15.5)
WBC: 17 10*3/uL — ABNORMAL HIGH (ref 4.0–10.5)
WBC: 17.9 10*3/uL — ABNORMAL HIGH (ref 4.0–10.5)
WBC: 17.9 10*3/uL — ABNORMAL HIGH (ref 4.0–10.5)
nRBC: 0 % (ref 0.0–0.2)
nRBC: 0.1 % (ref 0.0–0.2)
nRBC: 0.2 % (ref 0.0–0.2)

## 2020-01-10 LAB — BASIC METABOLIC PANEL
Anion gap: 12 (ref 5–15)
BUN: 29 mg/dL — ABNORMAL HIGH (ref 8–23)
CO2: 23 mmol/L (ref 22–32)
Calcium: 8.1 mg/dL — ABNORMAL LOW (ref 8.9–10.3)
Chloride: 107 mmol/L (ref 98–111)
Creatinine, Ser: 2.17 mg/dL — ABNORMAL HIGH (ref 0.61–1.24)
GFR, Estimated: 30 mL/min — ABNORMAL LOW (ref >60)
Glucose, Bld: 149 mg/dL — ABNORMAL HIGH (ref 70–99)
Potassium: 5.1 mmol/L (ref 3.5–5.1)
Sodium: 142 mmol/L (ref 135–145)

## 2020-01-10 LAB — COOXEMETRY PANEL
Carboxyhemoglobin: 1.1 % (ref 0.5–1.5)
Carboxyhemoglobin: 0.9 % (ref 0.5–1.5)
Carboxyhemoglobin: 0.8 % (ref 0.5–1.5)
Carboxyhemoglobin: 1 % (ref 0.5–1.5)
Methemoglobin: 0.9 % (ref 0.0–1.5)
Methemoglobin: 0.9 % (ref 0.0–1.5)
Methemoglobin: 0.9 % (ref 0.0–1.5)
Methemoglobin: 0.9 % (ref 0.0–1.5)
O2 Saturation: 48.1 %
O2 Saturation: 39.4 %
O2 Saturation: 39.7 %
O2 Saturation: 50.4 %
Total hemoglobin: 8.1 g/dL — ABNORMAL LOW (ref 12.0–16.0)
Total hemoglobin: 8.8 g/dL — ABNORMAL LOW (ref 12.0–16.0)
Total hemoglobin: 8.8 g/dL — ABNORMAL LOW (ref 12.0–16.0)
Total hemoglobin: 8.2 g/dL — ABNORMAL LOW (ref 12.0–16.0)

## 2020-01-10 LAB — PHOSPHORUS: Phosphorus: 6.1 mg/dL — ABNORMAL HIGH (ref 2.5–4.6)

## 2020-01-10 LAB — PREPARE PLATELET PHERESIS: Unit division: 0

## 2020-01-10 LAB — LACTIC ACID, PLASMA
Lactic Acid, Venous: 8.6 mmol/L (ref 0.5–1.9)
Lactic Acid, Venous: 8.5 mmol/L (ref 0.5–1.9)

## 2020-01-10 LAB — PREPARE RBC (CROSSMATCH)

## 2020-01-10 LAB — PROCALCITONIN: Procalcitonin: 43.79 ng/mL

## 2020-01-10 LAB — BPAM PLATELET PHERESIS
Blood Product Expiration Date: 202110192359
ISSUE DATE / TIME: 202110190929
Unit Type and Rh: 6200

## 2020-01-10 LAB — COMPREHENSIVE METABOLIC PANEL
ALT: 2101 U/L — ABNORMAL HIGH (ref 0–44)
AST: 3030 U/L — ABNORMAL HIGH (ref 15–41)
Albumin: 2.5 g/dL — ABNORMAL LOW (ref 3.5–5.0)
Alkaline Phosphatase: 53 U/L (ref 38–126)
Anion gap: 16 — ABNORMAL HIGH (ref 5–15)
BUN: 40 mg/dL — ABNORMAL HIGH (ref 8–23)
CO2: 18 mmol/L — ABNORMAL LOW (ref 22–32)
Calcium: 7.7 mg/dL — ABNORMAL LOW (ref 8.9–10.3)
Chloride: 106 mmol/L (ref 98–111)
Creatinine, Ser: 2.54 mg/dL — ABNORMAL HIGH (ref 0.61–1.24)
GFR, Estimated: 25 mL/min — ABNORMAL LOW (ref >60)
Glucose, Bld: 133 mg/dL — ABNORMAL HIGH (ref 70–99)
Potassium: 4.8 mmol/L (ref 3.5–5.1)
Sodium: 140 mmol/L (ref 135–145)
Total Bilirubin: 0.8 mg/dL (ref 0.3–1.2)
Total Protein: 4.2 g/dL — ABNORMAL LOW (ref 6.5–8.1)

## 2020-01-10 LAB — HEPARIN LEVEL (UNFRACTIONATED)
Heparin Unfractionated: <0.1 IU/mL — ABNORMAL LOW (ref 0.30–0.70)
Heparin Unfractionated: <0.1 IU/mL — ABNORMAL LOW (ref 0.30–0.70)

## 2020-01-10 LAB — APTT
aPTT: 44 seconds — ABNORMAL HIGH (ref 24–36)
aPTT: 46 seconds — ABNORMAL HIGH (ref 24–36)

## 2020-01-10 LAB — GLUCOSE, CAPILLARY: Glucose-Capillary: 137 mg/dL — ABNORMAL HIGH (ref 70–99)

## 2020-01-10 LAB — ECHOCARDIOGRAM LIMITED
Height: 70 in
Weight: 2328.06 oz

## 2020-01-10 LAB — LACTATE DEHYDROGENASE: LDH: 1696 U/L — ABNORMAL HIGH (ref 98–192)

## 2020-01-10 MED ORDER — AMIODARONE HCL IN DEXTROSE 360-4.14 MG/200ML-% IV SOLN
30.0000 mg/h | INTRAVENOUS | Status: DC
  Administered 2020-01-11 – 2020-01-12 (×3): 30 mg/h via INTRAVENOUS
  Filled 2020-01-10: qty 200

## 2020-01-10 MED ORDER — SODIUM CHLORIDE 0.9 % IV SOLN: INTRAVENOUS | Status: DC | PRN

## 2020-01-10 MED ORDER — MILRINONE LACTATE IN DEXTROSE 20-5 MG/100ML-% IV SOLN
0.3750 ug/kg/min | INTRAVENOUS | Status: DC
  Administered 2020-01-10 – 2020-01-11 (×2): 0.375 ug/kg/min via INTRAVENOUS
  Filled 2020-01-10 (×2): qty 100

## 2020-01-10 MED ORDER — SODIUM BICARBONATE 8.4 % IV SOLN
50.0000 meq | Freq: Once | INTRAVENOUS | Status: AC
  Administered 2020-01-10: 50 meq via INTRAVENOUS
  Filled 2020-01-10: qty 50

## 2020-01-10 MED ORDER — SODIUM CHLORIDE 0.9 % IV SOLN
0.0300 mg/kg/h | INTRAVENOUS | Status: DC
  Administered 2020-01-10: 0.04 mg/kg/h via INTRAVENOUS
  Filled 2020-01-10: qty 250

## 2020-01-10 MED ORDER — PREDNISONE 20 MG PO TABS: 40.0000 mg | ORAL_TABLET | Freq: Every day | ORAL | Status: DC

## 2020-01-10 MED ORDER — MORPHINE SULFATE (PF) 2 MG/ML IV SOLN
2.0000 mg | INTRAVENOUS | Status: DC | PRN
  Administered 2020-01-10 (×2): 2 mg via INTRAVENOUS
  Filled 2020-01-10 (×2): qty 1

## 2020-01-10 MED ORDER — SODIUM CHLORIDE 0.9% IV SOLUTION: Freq: Once | INTRAVENOUS | Status: AC

## 2020-01-10 MED ORDER — SODIUM CHLORIDE 0.9 % IV SOLN
2.0000 g | Freq: Two times a day (BID) | INTRAVENOUS | Status: DC
  Administered 2020-01-10 (×2): 2 g via INTRAVENOUS
  Filled 2020-01-10 (×2): qty 2

## 2020-01-10 MED ORDER — MIDAZOLAM HCL 2 MG/2ML IJ SOLN: 1.0000 mg | INTRAMUSCULAR | Status: DC | PRN

## 2020-01-10 MED ORDER — FAMOTIDINE IN NACL 20-0.9 MG/50ML-% IV SOLN
20.0000 mg | Freq: Two times a day (BID) | INTRAVENOUS | Status: DC
  Administered 2020-01-10: 20 mg via INTRAVENOUS

## 2020-01-10 MED ORDER — DEXTROSE 5 % SOLN FOR IMPELLA PURGE CATHETER
INTRAVENOUS | Status: DC
  Administered 2020-01-10 – 2020-01-13 (×2): 500 mL
  Filled 2020-01-10 (×3): qty 1000

## 2020-01-10 MED ORDER — DEXMEDETOMIDINE HCL IN NACL 400 MCG/100ML IV SOLN
0.4000 ug/kg/h | INTRAVENOUS | Status: DC
  Administered 2020-01-11: 0.6 ug/kg/h via INTRAVENOUS
  Filled 2020-01-10: qty 100

## 2020-01-10 MED ORDER — OXYCODONE HCL 5 MG PO TABS: 5.0000 mg | ORAL_TABLET | ORAL | Status: DC | PRN

## 2020-01-10 MED ORDER — FLUTICASONE FUROATE-VILANTEROL 200-25 MCG/INH IN AEPB
1.0000 | INHALATION_SPRAY | Freq: Every day | RESPIRATORY_TRACT | Status: DC
  Filled 2020-01-10: qty 28

## 2020-01-10 MED ORDER — AMIODARONE HCL IN DEXTROSE 360-4.14 MG/200ML-% IV SOLN
60.0000 mg/h | INTRAVENOUS | Status: DC
  Administered 2020-01-10 (×2): 60 mg/h via INTRAVENOUS

## 2020-01-10 MED ORDER — FUROSEMIDE 10 MG/ML IJ SOLN
INTRAMUSCULAR | Status: AC
  Filled 2020-01-10: qty 4

## 2020-01-10 MED ORDER — AMIODARONE HCL IN DEXTROSE 360-4.14 MG/200ML-% IV SOLN
INTRAVENOUS | Status: AC
  Filled 2020-01-10: qty 200

## 2020-01-10 MED ORDER — UMECLIDINIUM BROMIDE 62.5 MCG/INH IN AEPB
1.0000 | INHALATION_SPRAY | Freq: Every day | RESPIRATORY_TRACT | Status: DC
  Filled 2020-01-10: qty 7

## 2020-01-10 MED ORDER — FUROSEMIDE 10 MG/ML IJ SOLN
40.0000 mg | Freq: Once | INTRAMUSCULAR | Status: AC
  Administered 2020-01-10: 40 mg via INTRAVENOUS

## 2020-01-10 MED ORDER — IPRATROPIUM-ALBUTEROL 0.5-2.5 (3) MG/3ML IN SOLN
3.0000 mL | Freq: Four times a day (QID) | RESPIRATORY_TRACT | Status: DC
  Administered 2020-01-10 – 2020-01-13 (×12): 3 mL via RESPIRATORY_TRACT
  Filled 2020-01-10 (×13): qty 3

## 2020-01-10 MED FILL — Electrolyte-R (PH 7.4) Solution: INTRAVENOUS | Qty: 3000 | Status: AC

## 2020-01-10 MED FILL — Calcium Chloride Inj 10%: INTRAVENOUS | Qty: 10 | Status: AC

## 2020-01-10 MED FILL — Heparin Sodium (Porcine) Inj 1000 Unit/ML: INTRAMUSCULAR | Qty: 10 | Status: AC

## 2020-01-10 MED FILL — Albumin, Human Inj 5%: INTRAVENOUS | Qty: 500 | Status: AC

## 2020-01-10 MED FILL — Sodium Bicarbonate IV Soln 8.4%: INTRAVENOUS | Qty: 50 | Status: AC

## 2020-01-10 MED FILL — Heparin Sodium (Porcine) Inj 1000 Unit/ML: INTRAMUSCULAR | Qty: 20 | Status: AC

## 2020-01-10 MED FILL — Sodium Chloride IV Soln 0.9%: INTRAVENOUS | Qty: 7000 | Status: AC

## 2020-01-10 MED FILL — Mannitol IV Soln 20%: INTRAVENOUS | Qty: 500 | Status: AC

## 2020-01-10 MED FILL — Lidocaine HCl Local Soln Prefilled Syringe 100 MG/5ML (2%): INTRAMUSCULAR | Qty: 5 | Status: AC

## 2020-01-10 NOTE — Consult Note (Signed)
NAME:  Adrian Phillips, MRN:  [id number], DOB:  1951/03/01, LOS: 9 ADMISSION DATE:  01/13/2020, CONSULTATION DATE:  01/10/20 REFERRING MD:  Marca Ancona, MD CHIEF COMPLAINT:  Acute hypoxemic respiratory failure  Brief History   69 year old male with emphysema and CAD admitted for NSTEMI and heart failure. Underwent CABG and Impella placement complicated by acute blood loss anemia secondary to left hemothorax s/p chest tube. PCCM consulted for hypoxemia  History of present illness   69 year old male active smoker who presented with shortness of breath and lower extremity edema and found with NSTEMI. Admitted for acute heart failure exacerbation/cardiogenic shock and NSTEMI. Cardiac cath showed occluded RCA, 95% pLAD, 80% OM1 and 80% OM2. Echo showed EF <20%. Underwent CABG and Impella placement complicated by acute blood loss anemia secondary to left hemothorax s/p chest tube.Underwent CABG and required Impella placement on 10/18. Extubated on 10/19. Despite diuresis and hemothorax improving, patient with persistent hypoxemia, PCCM consulted.  Patient and daughter provides history. He is an active smoker since 69 years old, smoking at least 1/2ppd. He works full time and denies any limitation in activity. At baseline he reports chronic cough with occasional sputum production and shortness of breath with heavy exertion. Occasionally wheezes. He does not use any inhalers at home. Has never been told he has COPD. He has never needed oxygen at home. He currently has fatigue, shortness of breath and wheezing.  Past Medical History  Emphysema CAD s/p CABG HTN HLD NSVT PAD s/p right fem-pop bypass Carotid stenosis  Significant Hospital Events   10/18 CABG and Impellaplacement  Consults:  PCCM Card  Procedures:  10/12 LHC 10/18 CABG, Impella  Significant Diagnostic Tests:  CT Chest 01/04/20 - Moderate paraseptal and centrilobular emphysema  CXR 01/10/20 - Pulmonary edema and small  bilateral pleural effusions, LVAD, Swan-Ganz, PICC, s/pt left chest tube x 2  Micro Data:    Antimicrobials:    Interim history/subjective:    Objective   Blood pressure 108/63, pulse (!) 105, temperature 98.6 F (37 C), resp. rate 18, height 5\' 10"  (1.778 m), weight 66 kg, SpO2 97 %. PAP: (25-47)/(2-21) 31/4 CVP:  [5 mmHg-17 mmHg] 5 mmHg CO:  [4 L/min-4.8 L/min] 4.8 L/min CI:  [2.3 L/min/m2-2.8 L/min/m2] 2.8 L/min/m2  FiO2 (%):  [30 %-100 %] 100 %   Intake/Output Summary (Last 24 hours) at 01/10/2020 1144 Last data filed at 01/10/2020 1100 Gross per 24 hour  Intake 3010.02 ml  Output 2559 ml  Net 451.02 ml   Filed Weights   01/09/2020 0500 01/09/20 0618 01/10/20 0630  Weight: 55.8 kg 63.6 kg 66 kg    Physical Exam: General: Thin critically ill-appearing, mild distress HENT: [place], AT, OP clear, MMM Eyes: EOMI, no scleral icterus Respiratory: Tachypneic, diminished breath sounds bilaterally.  No crackles, wheezing or rales, left chest tube with bloody output in chamber Cardiovascular: RRR, -M/R/G, no JVD Extremities:-Edema,-tenderness Neuro: AAO x4, CNII-XII grossly intact Skin: Intact, no rashes or bruising Psych: Normal mood, normal affect  Resolved Hospital Problem list     Assessment & Plan:   Acute hypoxemic respiratory failure secondary COPD exacerbation, pulm edema Moderate paraseptal and centrilobular emphysema with superimposed pulmonary edema on chest imaging. ABG reviewed. CVP 4-5.  --Steroids --Scheduled duonebs --Start maintenance bronchodilator: Breo and Incruse --IS as tolerated --Post-op pain management per primary team --Target SpO2 88-92%. Consider HHFNC if needed to maintain saturations --High risk for intubation  Tobacco abuse --Discussed smoking cessation  Cardiogenic shock S/p CABG ABLA  secondary to left hemothorax  --Pressor, Impella, chest tube management per cardiology  Pulmonary will follow for now. Will need outpatient Pulmonary  follow-up at discharge   Best practice:  Diet: Per primary Pain/Anxiety/Delirium protocol (if indicated): Per primary VAP protocol (if indicated): NA DVT prophylaxis: Heparin GI prophylaxis: Per primary Glucose control: Per primary Mobility: As tolerated Code Status: Full Family Communication: Patient and daughter at bedside Disposition: Per primary  Labs   CBC: Recent Labs  Lab 01/09/20 0502 01/09/20 0607 01/09/20 1415 01/09/20 1510 01/09/20 1738 01/09/20 1743 01/09/20 2315 01/09/20 2315 01/09/20 2323 01/10/20 0426 01/10/20 0446 01/10/20 0746 01/10/20 1121  WBC 14.2*  --  15.7*  --  16.5*  --  17.0*  --   --  17.9*  --   --   --   HGB 8.5*   < > 8.4*   < > 8.2*   < > 9.4*   < > 9.2* 9.2* 8.5* 8.5* 7.8*  HCT 26.5*   < > 25.7*   < > 25.2*   < > 28.7*   < > 27.0* 27.2* 25.0* 25.0* 23.0*  MCV 89.8  --  90.5  --  90.0  --  88.3  --   --  89.2  --   --   --   PLT 98*  --  101*  --  106*  --  100*  --   --  87*  --   --   --    < > = values in this interval not displayed.    Basic Metabolic Panel: Recent Labs  Lab 01/04/20 0300 01/04/20 0300 01/05/20 0345 01/06/20 0410 01/17/2020 2122 01/17/2020 2127 01/09/20 0502 01/09/20 0350 01/09/20 1415 01/09/20 1510 01/09/20 1738 01/09/20 1743 01/09/20 2323 01/10/20 0426 01/10/20 0446 01/10/20 0746 01/10/20 1121  NA 140   < > 137   < > 139   < > 138   < > 143   < > 143   < > 142 142 142 143 142  K 3.7   < > 4.0   < > 5.1   < > 5.3*   < > 4.9   < > 5.5*   < > 5.2* 5.1 5.1 4.9 4.6  CL 103   < > 100   < > 111  --  110  --  106  --  106  --   --  107  --   --   --   CO2 29   < > 26   < > 21*  --  18*  --  20*  --  19*  --   --  23  --   --   --   GLUCOSE 102*   < > 111*   < > 155*  --  130*  --  138*  --  145*  --   --  149*  --   --   --   BUN 13   < > 16   < > 14  --  14  --  18  --  22  --   --  29*  --   --   --   CREATININE 0.96   < > 1.06   < > 1.02  --  1.15  --  1.71*  --  1.82*  --   --  2.17*  --   --   --    CALCIUM 8.8*   < > 9.0   < >  8.5*  --  8.1*  --  8.3*  --  8.1*  --   --  8.1*  --   --   --   MG 1.9  --  2.0  --  2.6*  --  2.1  --   --   --  2.1  --   --   --   --   --   --    < > = values in this interval not displayed.   GFR: Estimated Creatinine Clearance: 30 mL/min (A) (by C-G formula based on SCr of 2.17 mg/dL (H)). Recent Labs  Lab 01/09/20 1415 01/09/20 1738 01/09/20 2315 01/10/20 0426 01/10/20 0754  PROCALCITON  --   --   --  43.79  --   WBC 15.7* 16.5* 17.0* 17.9*  --   LATICACIDVEN  --   --   --   --  8.6*    Liver Function Tests: Recent Labs  Lab 01/05/20 0345  AST 22  ALT 27  ALKPHOS 93  BILITOT 0.6  PROT 5.7*  ALBUMIN 2.9*   No results for input(s): LIPASE, AMYLASE in the last 168 hours. No results for input(s): AMMONIA in the last 168 hours.  ABG    Component Value Date/Time   PHART 7.219 (L) 01/10/2020 1121   PCO2ART 41.8 01/10/2020 1121   PO2ART 66 (L) 01/10/2020 1121   HCO3 17.1 (L) 01/10/2020 1121   TCO2 18 (L) 01/10/2020 1121   ACIDBASEDEF 10.0 (H) 01/10/2020 1121   O2SAT 89.0 01/10/2020 1121     Coagulation Profile: Recent Labs  Lab 01/12/2020 1502 01/04/2020 2211  INR 1.5* 1.4*    Cardiac Enzymes: Recent Labs  Lab 01/05/20 2144  CKTOTAL 40*    HbA1C: Hgb A1c MFr Bld  Date/Time Value Ref Range Status  2020-01-23 05:53 AM 5.4 4.8 - 5.6 % Final    Comment:    (NOTE) Pre diabetes:          5.7%-6.4%  Diabetes:              >6.4%  Glycemic control for   <7.0% adults with diabetes     CBG: Recent Labs  Lab 01/09/20 1504 01/09/20 1706 01/09/20 2011 01/09/20 2321 01/10/20 0444  GLUCAP 138* 147* 135* 149* 137*    Review of Systems:   Review of Systems  Constitutional: Negative for chills, diaphoresis, fever, malaise/fatigue and weight loss.  HENT: Negative for congestion.   Respiratory: Positive for cough and shortness of breath. Negative for hemoptysis, sputum production and wheezing.   Cardiovascular: Negative  for chest pain, palpitations and leg swelling.     Past Medical History  He,  has a past medical history of Diverticulosis, Hyperlipidemia, Hypertension, Internal hemorrhoids, and PVD (peripheral vascular disease) (HCC).   Surgical History    Past Surgical History:  Procedure Laterality Date  . COLONOSCOPY  12/13/2008   Dr Romelle Starcher at [facility]  . CORONARY ARTERY BYPASS GRAFT N/A 01/17/2020   Procedure: CORONARY ARTERY BYPASS GRAFTING (CABG) x 3  USING LEFT INTERNAL MAMMARY ARTERY AND LEFT GREATER SAPHENOUS VEIN. LIMA TO LAD, SVG TO OM1, SVG TO OM2;  Surgeon: Kerin Perna, MD;  Location: [facility] OR;  Service: Open Heart Surgery;  Laterality: N/A;  . ENDOVEIN HARVEST OF GREATER SAPHENOUS VEIN Left 01/11/2020   Procedure: ENDOVEIN HARVEST OF GREATER SAPHENOUS VEIN;  Surgeon: Kerin Perna, MD;  Location: [facility] OR;  Service: Open Heart Surgery;  Laterality: Left;  . INGUINAL HERNIA REPAIR  Right   . PLACEMENT OF IMPELLA LEFT VENTRICULAR ASSIST DEVICE N/A 01/09/2020   Procedure: PLACEMENT OF IMPELLA LEFT VENTRICULAR ASSIST DEVICE 5.5 SN [id number];  Surgeon: Kerin Perna, MD;  Location: [facility] OR;  Service: Open Heart Surgery;  Laterality: N/A;  . RIGHT/LEFT HEART CATH AND CORONARY ANGIOGRAPHY N/A 01/15/2020   Procedure: RIGHT/LEFT HEART CATH AND CORONARY ANGIOGRAPHY;  Surgeon: Lyn Records, MD;  Location: [facility] INVASIVE CV LAB;  Service: Cardiovascular;  Laterality: N/A;  . TEE WITHOUT CARDIOVERSION N/A 12/24/2019   Procedure: TRANSESOPHAGEAL ECHOCARDIOGRAM (TEE);  Surgeon: Donata Clay, Theron Arista, MD;  Location: [facility] OR;  Service: Open Heart Surgery;  Laterality: N/A;     Social History   reports that he has been smoking cigarettes. He has never used smokeless tobacco. He reports previous alcohol use. He reports that he does not use drugs.   Family History   His family history is negative for Colon polyps and Colon cancer.   Allergies Allergies  Allergen Reactions  . Lisinopril  Swelling    Angioedema in 2020     Home Medications  Prior to Admission medications   Medication Sig Start Date End Date Taking? Authorizing Provider  amLODipine (NORVASC) 10 MG tablet Take 10 mg by mouth daily.   Yes [provider]  aspirin 325 MG tablet Take 325 mg by mouth daily.   Yes [provider]  atorvastatin (LIPITOR) 80 MG tablet Take 40 mg by mouth at bedtime.    Yes [provider]  Cholecalciferol 50 MCG (2000 UT) TABS Take 4,000 Units by mouth daily.   Yes [provider]  clopidogrel (PLAVIX) 75 MG tablet Take 75 mg by mouth at bedtime.    Yes [provider]  tadalafil (CIALIS) 10 MG tablet Take 5 mg by mouth daily as needed for erectile dysfunction.   Yes [provider]     Critical care time: 36 min    The patient is critically ill with multiple organ systems failure and requires high complexity decision making for assessment and support, frequent evaluation and titration of therapies, application of advanced monitoring technologies and extensive interpretation of multiple databases.  Independent Critical Care Time: 36 Minutes.   Mechele Collin, M.D. [facility] Pulmonary/Critical Care Medicine 01/10/2020 9:14 PM   Please see Amion for pager number to reach on-call Pulmonary and Critical Care Team.

## 2020-01-10 NOTE — Progress Notes (Addendum)
T[facility] BRIEF SICU PROGRESS NOTE  2 Days Post-Op  S/P Procedure(s) (LRB): CORONARY ARTERY BYPASS GRAFTING (CABG) x 3  USING LEFT INTERNAL MAMMARY ARTERY AND LEFT GREATER SAPHENOUS VEIN. LIMA TO LAD, SVG TO OM1, SVG TO OM2 (N/A) PLACEMENT OF IMPELLA LEFT VENTRICULAR ASSIST DEVICE 5.5 SN [id number] (N/A) TRANSESOPHAGEAL ECHOCARDIOGRAM (TEE) (N/A) ENDOVEIN HARVEST OF GREATER SAPHENOUS VEIN (Left)   Awake and alert, no complaints NSR w/ stable hemodynamics and Impella flows, PA pressures low Mixed venous co-ox still only 40% but cardiac index 2.8 Breathing comfortably w/ O2 sats 92-95% on HFNC UOP adequate, creatinine up 2.5 Lactate unchanged 8.5 Follow up Hgb/Hct pending  Plan: Continue current plan  Purcell Nails, MD 01/10/2020 5:46 PM

## 2020-01-10 NOTE — Progress Notes (Addendum)
Patient ID: Adrian Phillips, male   DOB: [birth year], 69 y.o.   MRN: [id number]     Advanced Heart Failure Rounding Note  PCP-Cardiologist: Jenkins Rouge, MD   Subjective:    CABG (LIMA-LAD, SVG-OM1, SVG-dCFx) + Impella 5.5 on 10/18.   Difficult day yesterday with venous bleeding into left pleural space, had 3 units PRBCs and a unit of plts as well as FFP.  Had chest tube placed on left for pleural effusion.  Chest tube output decreasing per nurse.  On low dose heparin in Impella purge.   Oxygen saturation has been marginal.  CXR with mild edema, left effusion.  ABG 7.30/43/57/86%.    Creatinine up to 2.17.  MAP stable in 70s this morning on milrinone 0.3, NE 12, vasopressin 0.03.  I/Os even but weight up.   WBCs 17.9 but afebrile, no abx.   Impella:  P7, good waveforms with no alarms.  Flow 3.7 L/min LDH 236 => 1696 Position looked ok on echo yesterday.   Swan: Advanced back in position this morning (had migrated back to RV).  CVP 5 PA 33/12 PCWP 11 CI 2.7  Cardiac MRI:  1. Moderate LVE with diffuse hypokinesis worse in the septum and apex EF 26% 2. Post gadolinium images with sub-endocardial scar in the mid distal anterior wall, septum and apex. No transmural or full thickness scar suggesting possibility of LVEF recovery with revascularization.    Objective:   Weight Range: 66 kg Body mass index is 20.88 kg/m.   Vital Signs:   Temp:  [96.8 F (36 C)-99.5 F (37.5 C)] 99 F (37.2 C) (10/20 0630) Pulse Rate:  [90-137] 108 (10/20 0630) Resp:  [15-33] 25 (10/20 0630) BP: (79-126)/(36-89) 93/76 (10/20 0600) SpO2:  [66 %-100 %] 95 % (10/20 0630) Arterial Line BP: (68-157)/(46-74) 112/62 (10/20 0630) FiO2 (%):  [30 %-100 %] 100 % (10/20 0450) Weight:  [66 kg] 66 kg (10/20 0630) Last BM Date: 01/18/2020  Weight change: Filed Weights   12/27/2019 0500 01/09/20 0618 01/10/20 0630  Weight: 55.8 kg 63.6 kg 66 kg    Intake/Output:   Intake/Output Summary (Last 24  hours) at 01/10/2020 0833 Last data filed at 01/10/2020 0700 Gross per 24 hour  Intake 3773.34 ml  Output 3299 ml  Net 474.34 ml      Physical Exam    General: NAD Neck: No JVD, no thyromegaly or thyroid nodule.  Lungs: Decreased at bases.  CV: Nondisplaced PMI.  Heart regular S1/S2, no S3/S4, no murmur.  Impella sounds.  No peripheral edema.   Abdomen: Soft, nontender, no hepatosplenomegaly, no distention.  Skin: Intact without lesions or rashes.  Neurologic: Alert and oriented x 3.  Psych: Normal affect. Extremities: No clubbing or cyanosis.  HEENT: Normal.    Telemetry   NSR 100s, Personally reviewed   Labs    CBC Recent Labs    01/09/20 2315 01/09/20 2323 01/10/20 0426 01/10/20 0426 01/10/20 0446 01/10/20 0746  WBC 17.0*  --  17.9*  --   --   --   HGB 9.4*   < > 9.2*   < > 8.5* 8.5*  HCT 28.7*   < > 27.2*   < > 25.0* 25.0*  MCV 88.3  --  89.2  --   --   --   PLT 100*  --  87*  --   --   --    < > = values in this interval not displayed.   Basic Metabolic Panel Recent Labs  01/09/20 0502 01/09/20 3810 01/09/20 1738 01/09/20 1743 01/10/20 0426 01/10/20 0426 01/10/20 0446 01/10/20 0746  NA 138   < > 143   < > 142   < > 142 143  K 5.3*   < > 5.5*   < > 5.1   < > 5.1 4.9  CL 110   < > 106  --  107  --   --   --   CO2 18*   < > 19*  --  23  --   --   --   GLUCOSE 130*   < > 145*  --  149*  --   --   --   BUN 14   < > 22  --  29*  --   --   --   CREATININE 1.15   < > 1.82*  --  2.17*  --   --   --   CALCIUM 8.1*   < > 8.1*  --  8.1*  --   --   --   MG 2.1  --  2.1  --   --   --   --   --    < > = values in this interval not displayed.   Liver Function Tests No results for input(s): AST, ALT, ALKPHOS, BILITOT, PROT, ALBUMIN in the last 72 hours. No results for input(s): LIPASE, AMYLASE in the last 72 hours. Cardiac Enzymes No results for input(s): CKTOTAL, CKMB, CKMBINDEX, TROPONINI in the last 72 hours.  BNP: BNP (last 3 results) Recent Labs      12/28/2019 0151  BNP 269.4*    ProBNP (last 3 results) No results for input(s): PROBNP in the last 8760 hours.   D-Dimer No results for input(s): DDIMER in the last 72 hours. Hemoglobin A1C No results for input(s): HGBA1C in the last 72 hours. Fasting Lipid Panel No results for input(s): CHOL, HDL, LDLCALC, TRIG, CHOLHDL, LDLDIRECT in the last 72 hours. Thyroid Function Tests No results for input(s): TSH, T4TOTAL, T3FREE, THYROIDAB in the last 72 hours.  Invalid input(s): FREET3  Other results:   Imaging    DG Chest Port 1 View  Result Date: 01/10/2020 CLINICAL DATA:  Left ventricular assist device is present. EXAM: PORTABLE CHEST 1 VIEW COMPARISON:  01/09/2020 FINDINGS: Postoperative changes in the mediastinum. Left ventricular assist device, right Swan-Ganz catheter, right PICC line, 2 left chest tubes, and single right chest tube unchanged in position. Cardiac enlargement. Small bilateral pleural effusions, greater on the left. Interstitial changes suggesting edema. Parenchymal changes are mildly progressed since previous study. IMPRESSION: 1. Appliances remain unchanged in position. 2. Cardiac enlargement with increasing pulmonary edema and bilateral pleural effusions. Electronically Signed   By: Lucienne Capers M.D.   On: 01/10/2020 06:51   DG CHEST PORT 1 VIEW  Result Date: 01/09/2020 CLINICAL DATA:  Chest tube placement EXAM: PORTABLE CHEST 1 VIEW COMPARISON:  Earlier same day FINDINGS: There are now two left apically directed chest tubes. Unchanged right chest tube. No pneumothorax. Right IJ Swan-Ganz catheter tip is likely near the main pulmonary artery bifurcation. Right PICC line is unchanged. Similar position of Impella device. Similar lung aeration with persistent left basilar density. Decreased left pleural effusion. Stable interstitial prominence. IMPRESSION: Lines and tubes as above with new left apical chest tube. No pneumothorax. Similar lung aeration with  persistent left basilar atelectasis/consolidation. Decreased left pleural effusion. Electronically Signed   By: Macy Mis M.D.   On: 01/09/2020 09:49   ECHOCARDIOGRAM LIMITED  Result Date: 01/09/2020    ECHOCARDIOGRAM LIMITED REPORT   Patient Name:   Adrian Phillips Date of Exam: 01/09/2020 Medical Rec #:  [id number]             Height:       70.0 in Accession #:    [id number]            Weight:       140.2 lb Date of Birth:  September 08, 1950             BSA:          1.795 m Patient Age:    25 years              BP:           95/79 mmHg Patient Gender: M                     HR:           107 bpm. Exam Location:  Inpatient Procedure: Limited Echo Indications:    CHF 428. Impella placement  History:        Patient has prior history of Echocardiogram examinations, most                 recent 12/25/2019. Risk Factors:Dyslipidemia, Hypertension and                 Current Smoker. Impella device placement. CABG.  Sonographer:    Jannett Celestine RDCS (AE) Referring Phys: [place]  1. There is a left ventricular support device that was 5 cm into the LV cavity. Appropriate pull back to 4.2 cm through course of study. Left ventricular ejection fraction, by estimation, is <20%. The left ventricle has severely decreased function. The left ventricle demonstrates global hypokinesis. Comparison(s): A prior study was performed on 01/03/2020. LV support device is new from prior. FINDINGS  Left Ventricle: There is a left ventricular support device that was 5 cm into the LV cavity. Appropriate pull back to 4.2 cm through course of study. Left ventricular ejection fraction, by estimation, is <20%. The left ventricle has severely decreased function. The left ventricle demonstrates global hypokinesis. Rudean Haskell MD Electronically signed by Rudean Haskell MD Signature Date/Time: 01/09/2020/1:07:06 PM    Final      Medications:     Scheduled Medications: . sodium chloride   Intravenous  Once  . acetaminophen  1,000 mg Oral Q6H   Or  . acetaminophen (TYLENOL) oral liquid 160 mg/5 mL  1,000 mg Per Tube Q6H  . amiodarone  200 mg Oral BID  . aspirin EC  325 mg Oral Daily   Or  . aspirin  324 mg Per Tube Daily  . atorvastatin  80 mg Oral Daily  . bisacodyl  10 mg Oral Daily   Or  . bisacodyl  10 mg Rectal Daily  . chlorhexidine gluconate (MEDLINE KIT)  15 mL Mouth Rinse BID  . Chlorhexidine Gluconate Cloth  6 each Topical Daily  . docusate sodium  200 mg Oral Daily  . furosemide  20 mg Intravenous BID  . influenza vaccine adjuvanted  0.5 mL Intramuscular Tomorrow-1000  . insulin aspart  0-24 Units Subcutaneous Q4H  . metoCLOPramide (REGLAN) injection  10 mg Intravenous Q6H  . pantoprazole  40 mg Oral Daily  . sodium chloride flush  10-40 mL Intracatheter Q12H  . sodium chloride flush  3 mL Intravenous Q12H  . thiamine  100 mg Oral Daily  Or  . thiamine  100 mg Intravenous Daily    Infusions: . sodium chloride 20 mL/hr at 01/10/20 0700  . sodium chloride    . sodium chloride 20 mL/hr at 01/09/20 1546  . sodium chloride 20 mL/hr at 01/10/20 0700  . epinephrine 2.5 mcg/min (01/10/20 0621)  . famotidine (PEPCID) IV Stopped (01/09/20 1132)  . impella catheter heparin 25 unit/mL in dextrose 5%    . milrinone 0.3 mcg/kg/min (01/10/20 0700)  . nitroGLYCERIN Stopped (12/28/2019 1928)  . norepinephrine (LEVOPHED) Adult infusion 10 mcg/min (01/10/20 0700)  . phenylephrine (NEO-SYNEPHRINE) Adult infusion 0 mcg/min (01/20/2020 1455)  . vasopressin 0.03 Units/min (01/10/20 0700)    PRN Medications: sodium chloride, ALPRAZolam, metoprolol tartrate, midazolam, morphine injection, ondansetron (ZOFRAN) IV, oxyCODONE, sodium chloride flush, sodium chloride flush, traMADol     Assessment/Plan   1. CAD: NSTEMI with cath showing totally occluded RCA with collaterals from LAD, 95% proximal LAD, 80% OM1 and 80% OM2.  Suspect he would be best-served by CABG.  Cardiac MRI showed  significant viability, would expect to see improvement in LV function with revascularization. Now s/p CABG with LIMA-LAD, SVG-OM1, SVG-dCFX.  - Continue ASA 325 daily.  - Continue atorvastatin 80 mg daily.  2. Acute systolic CHF/post-op cardiogenic shock: Ischemic cardiomyopathy.  Echo this admission with EF < 20%, normal RV.  RHC showed CI mildly decreased at 1.93 with mildly elevated PCWP and normal RA pressure.  Pre-op cardiac MRI shows significant viability, would expect to see improved LV function with revascularization.  Now post-CABG with Impella 5.5 in place functioning normally, good position on echo yesterday.  CI 2.7 this morning with CVP 5 and PCWP 11 (Swan advanced back into position).  Co-ox is not helpful due to low oxygen saturation.  He remains on NE 12, milrinone 0.25, vasopressin 0.03, epinephrine 2.5 with excellent MAP.  Creatinine rising.  Oxygen saturation low with some lower lobe airspace disease and left pleural effusion though as above filling pressures not elevated.  - Will follow thermodilution CO as co-ox not helpful with low oxygen saturation.  - With rise in creatinine and low filling pressures, will keep Lasix at 20 mg IV bid today.   - Increase Impella to P8 for now while weaning vasoactive meds, check with TCTS to see if we can increase heparin in purge to usual dosing with decreased CT output.  Will assess position under echo => position adjusted. Think rise in LDH was due to multiple blood products yesterday rather than hemolysis.   - Continue milrinone 0.25.   - Wean pressors today, can stop vasopressin. Continue epinephrine and NE for now, wean slowly.   - Would not retry Entresto given history of angioedema with ACEI, eventually to ARB.  3. NSVT: Noted runs this admission.  No ectopy noted now that he is on amiodarone.  - Continue amiodarone 200 mg bid peri-operatively.  4. Active smoker: Needs to quit, counseled.  5. PAD: h/o right fem-pop bypass.  6. Carotid  stenosis: Asymptomatic 60-79% RICA stenosis.  7. ID: Afebrile with WBCs elevated at 17.9.  Low oxygen saturation, ?lower lobe infiltrates on lungs.  - Will cover with cefepime for now and send respiratory culture and procalcitonin. Would hold off on vancomycin for now with rising creatinine.  8. Anemia: Post-op blood loss. Transfuse Hgb < 8.  9. Acute hypoxemic respiratory failure: Now extubated but oxygen saturation continues to run low.  Has left chest tube for pleural effusion.  Lower lobe infiltrates/edema, afebrile but elevated WBCs.  With low CVP and PCWP, cannot explain low oxygen saturations only by pulmonary edema.  - As above, cover with with cefepime.  - Incentive spirometry.  10. AKI: Creatinine up to 2.1, suspect cardiorenal.  Cardiac output is good and MAP stable.  Hopefully will improve.  With low CVP and PCWP will only give very gentle diuretic dosing despite rise in weight.   CRITICAL CARE Performed by: Loralie Champagne  Total critical care time: 40 minutes  Critical care time was exclusive of separately billable procedures and treating other patients.  Critical care was necessary to treat or prevent imminent or life-threatening deterioration.  Critical care was time spent personally by me on the following activities: development of treatment plan with patient and/or surrogate as well as nursing, discussions with consultants, evaluation of patient's response to treatment, examination of patient, obtaining history from patient or surrogate, ordering and performing treatments and interventions, ordering and review of laboratory studies, ordering and review of radiographic studies, pulse oximetry and re-evaluation of patient's condition.   Loralie Champagne 01/10/2020 8:33 AM

## 2020-01-10 NOTE — Progress Notes (Addendum)
ANTICOAGULATION CONSULT NOTE   Pharmacy Consult for heparin Indication: Impella  Allergies  Allergen Reactions  . Lisinopril Swelling    Angioedema in 2020    Patient Measurements: Height: 5\' 10"  (177.8 cm) Weight: 66 kg (145 lb 8.1 oz) IBW/kg (Calculated) : 73 Heparin Dosing Weight: 56kg  Vital Signs: Temp: 99.5 F (37.5 C) (10/20 1700) BP: 117/74 (10/20 1434) Pulse Rate: 94 (10/20 1700)  Labs: Recent Labs    01/05/2020 1502 01/15/2020 2122 12/30/2019 2211 01/17/2020 2219 01/09/20 1738 01/09/20 1743 01/09/20 2315 01/09/20 2323 01/10/20 0426 01/10/20 0426 01/10/20 0446 01/10/20 0446 01/10/20 0746 01/10/20 1121 01/10/20 1622 01/10/20 1644  HGB 10.2*  9.5*   < >  --    < > 8.2*   < > 9.4*   < > 9.2*   < > 8.5*   < > 8.5* 7.8*  --   --   HCT 30.0*  30.7*   < >  --    < > 25.2*   < > 28.7*   < > 27.2*   < > 25.0*  --  25.0* 23.0*  --   --   PLT 117*   < >  --    < > 106*  --  100*  --  87*  --   --   --   --   --   --   --   APTT 35  --   --    < > 53*  --   --   --  44*  --   --   --   --   --   --  46*  LABPROT 18.0*  --  16.3*  --   --   --   --   --   --   --   --   --   --   --   --   --   INR 1.5*  --  1.4*  --   --   --   --   --   --   --   --   --   --   --   --   --   HEPARINUNFRC  --   --   --    < > <0.10*  --   --   --  <0.10*  --   --   --   --   --  <0.10*  --   CREATININE  --    < >  --    < > 1.82*  --   --   --  2.17*  --   --   --   --   --   --  2.54*   < > = values in this interval not displayed.    Estimated Creatinine Clearance: 25.6 mL/min (A) (by C-G formula based on SCr of 2.54 mg/dL (H)).   Medical History: Past Medical History:  Diagnosis Date  . Diverticulosis   . Hyperlipidemia   . Hypertension   . Internal hemorrhoids   . PVD (peripheral vascular disease) (HCC)     Assessment: 89 yoM admitted with NSTEMI started on IV heparin now s/p CABG with Impella placement. Pharmacy following along anticoagulation plans.  Heparin  25units/ml currently in purge solution only purge flow 12.9 ml/hr  = 320uts/hr of heparin. HL still undetectable this afternoon will recheck in am.   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 Heparin purge orders changed to D5 purge and systemic bivalirudin. Will aim for low end of goal 50-60s for now.  Aptt in 4 hours then twice daily 5a/5p 01/10/2020 8:44 PM

## 2020-01-10 NOTE — Progress Notes (Signed)
Pharmacy Antibiotic Note  Adrian Phillips is a 69 y.o. male admitted on January 22, 2020 with SOB s/p ACS > CABG+impella. Afebrile, Cxr possible pna, wbc elevated 17, Cr 2  Pharmacy has been consulted for cefepime dosing.  Plan: Cefepime 2gm q12h   Height: 5\' 10"  (177.8 cm) Weight: 66 kg (145 lb 8.1 oz) IBW/kg (Calculated) : 73  Temp (24hrs), Avg:98.3 F (36.8 C), Min:96.8 F (36 C), Max:99.5 F (37.5 C)  Recent Labs  Lab 12/31/2019 2122 01/09/20 0006 01/09/20 0502 01/09/20 1415 01/09/20 1738 01/09/20 2315 01/10/20 0426 01/10/20 0754  WBC 12.4*   < > 14.2* 15.7* 16.5* 17.0* 17.9*  --   CREATININE 1.02  --  1.15 1.71* 1.82*  --  2.17*  --   LATICACIDVEN  --   --   --   --   --   --   --  8.6*   < > = values in this interval not displayed.    Estimated Creatinine Clearance: 30 mL/min (A) (by C-G formula based on SCr of 2.17 mg/dL (H)).    Allergies  Allergen Reactions  . Lisinopril Swelling    Angioedema in 2020    Antimicrobials this admission:   Dose adjustments this admission:   Microbiology results:   2021 Pharm.D. CPP, BCPS Clinical Pharmacist [phone] 01/10/2020 3:32 PM

## 2020-01-10 NOTE — Progress Notes (Signed)
Patient pulled swan out from 49cm  to 41cm. PA wave form has changed. No ectopies noted at this time. Will continue to monitor closely and pull swan out into RA if ectopies noted. Pt educated again about the complications of pulling any of the lines and tubes out.

## 2020-01-10 NOTE — Progress Notes (Signed)
PT Cancellation Note  Patient Details Name: YUJI WALTH MRN: [id number] DOB: 15-Nov-1950   Cancelled Treatment:    Reason Eval/Treat Not Completed: Medical issues which prohibited therapy (Pt with central Impella.  Nurse asked PT to sign off for now)   Berline Lopes 01/10/2020, 10:18 AM  Fredna Stricker W,PT Acute Rehabilitation Services Pager:  [phone]  Office:  [phone]

## 2020-01-10 NOTE — Progress Notes (Signed)
  Echocardiogram 2D Echocardiogram has been performed.  Dorena Dew Donaciano Range 01/10/2020, 9:34 AM

## 2020-01-10 NOTE — Procedures (Signed)
Arterial Catheter Insertion Procedure Note  Adrian Phillips  [id number]  [birth year]  Date:01/10/20  Time:9:14 PM    Provider Performing: Posey Boyer    Procedure: Insertion of Arterial Line (46568) with US guidance (12751)   Indication(s) Blood pressure monitoring and/or need for frequent ABGs  Consent Risks of the procedure as well as the alternatives and risks of each were explained to the patient and/or caregiver.  Consent for the procedure was obtained and is signed in the bedside chart  Anesthesia None   Time Out Verified patient identification, verified procedure, site/side was marked, verified correct patient position, special equipment/implants available, medications/allergies/relevant history reviewed, required imaging and test results available.   Sterile Technique Maximal sterile technique including full sterile barrier drape, hand hygiene, sterile gown, sterile gloves, mask, hair covering, sterile ultrasound probe cover (if used).   Procedure Description Area of catheter insertion was cleaned with chlorhexidine and draped in sterile fashion. With real-time ultrasound guidance an arterial catheter was placed into the right radial artery.  Biopatch and sterile dressing applied.  Appropriate arterial tracings confirmed on monitor.     Complications/Tolerance None; patient tolerated the procedure well.   EBL Minimal   Specimen(s) None     Posey Boyer, ACNP  Pulmonary & Critical Care 01/10/2020, 9:15 PM

## 2020-01-10 NOTE — Anesthesia Postprocedure Evaluation (Signed)
Anesthesia Post Note  Patient: Adrian Phillips  Procedure(s) Performed: CORONARY ARTERY BYPASS GRAFTING (CABG) x 3  USING LEFT INTERNAL MAMMARY ARTERY AND LEFT GREATER SAPHENOUS VEIN. LIMA TO LAD, SVG TO OM1, SVG TO OM2 (N/A Chest) PLACEMENT OF IMPELLA LEFT VENTRICULAR ASSIST DEVICE 5.5 SN [id number] (N/A ) TRANSESOPHAGEAL ECHOCARDIOGRAM (TEE) (N/A ) ENDOVEIN HARVEST OF GREATER SAPHENOUS VEIN (Left )     Patient location during evaluation: SICU Anesthesia Type: General Level of consciousness: awake, awake and alert and oriented Pain management: pain level controlled Vital Signs Assessment: post-procedure vital signs reviewed and stable Respiratory status: spontaneous breathing, nonlabored ventilation and patient connected to face mask oxygen Cardiovascular status: blood pressure returned to baseline Anesthetic complications: no   No complications documented.  Last Vitals:  Vitals:   01/10/20 0630 01/10/20 0925  BP:    Pulse: (!) 108   Resp: (!) 25   Temp: 37.2 C   SpO2: 95% 94%    Last Pain:  Vitals:   01/10/20 0600  TempSrc:   PainSc: 2                  Shavonta Gossen COKER

## 2020-01-10 NOTE — Progress Notes (Signed)
ANTICOAGULATION CONSULT NOTE   Pharmacy Consult for heparin Indication: Impella  Allergies  Allergen Reactions  . Lisinopril Swelling    Angioedema in 2020    Patient Measurements: Height: 5\' 10"  (177.8 cm) Weight: 66 kg (145 lb 8.1 oz) IBW/kg (Calculated) : 73 Heparin Dosing Weight: 56kg  Vital Signs: Temp: 99.5 F (37.5 C) (10/20 1434) BP: 117/74 (10/20 1434) Pulse Rate: 89 (10/20 1434)  Labs: Recent Labs     0000 01/13/2020 1502 01/15/2020 2122 01/03/2020 2211 01/19/2020 2219 01/18/2020 2306 01/09/20 0502 01/09/20 0607 01/09/20 1415 01/09/20 1510 01/09/20 1738 01/09/20 1743 01/09/20 2315 01/09/20 2323 01/10/20 0426 01/10/20 0426 01/10/20 0446 01/10/20 0446 01/10/20 0746 01/10/20 1121  HGB  --  10.2*  9.5*   < >  --   --    < > 8.5*   < > 8.4*   < > 8.2*   < > 9.4*   < > 9.2*   < > 8.5*   < > 8.5* 7.8*  HCT  --  30.0*  30.7*   < >  --   --    < > 26.5*   < > 25.7*   < > 25.2*   < > 28.7*   < > 27.2*   < > 25.0*  --  25.0* 23.0*  PLT  --  117*   < >  --   --    < > 98*   < > 101*   < > 106*  --  100*  --  87*  --   --   --   --   --   APTT   < > 35  --   --   --   --  92*  --   --   --  53*  --   --   --  44*  --   --   --   --   --   LABPROT  --  18.0*  --  16.3*  --   --   --   --   --   --   --   --   --   --   --   --   --   --   --   --   INR  --  1.5*  --  1.4*  --   --   --   --   --   --   --   --   --   --   --   --   --   --   --   --   HEPARINUNFRC  --   --   --   --  0.29*  --   --   --   --   --  <0.10*  --   --   --  <0.10*  --   --   --   --   --   CREATININE  --   --    < >  --   --    < > 1.15   < > 1.71*  --  1.82*  --   --   --  2.17*  --   --   --   --   --    < > = values in this interval not displayed.    Estimated Creatinine Clearance: 30 mL/min (A) (by C-G formula based on SCr of 2.17 mg/dL (H)).   Medical History: Past Medical History:  Diagnosis Date  . Diverticulosis   . Hyperlipidemia   . Hypertension   . Internal hemorrhoids   .  PVD (peripheral vascular disease) (HCC)     Assessment: 57 yoM admitted with NSTEMI started on IV heparin now s/p CABG with Impella placement. Pharmacy following along anticoagulation plans. Heparin 25uts/ml currently in purge solution only purge flow 12.2 ml/hr  = 300uts/hr of heparin HL undetectable, h/h dropped > prbc today LDH up 1600 but s/p several blood products yesterday - will check in am    Goal of Therapy:  Heparin level 0.2-0.5 units/ml Monitor platelets by anticoagulation protocol: Yes   Plan:  -Heparinized purge (25 units/ml) -daily HL, CBC  - monitor s/s bleeding  Leota Sauers Pharm.D. CPP, BCPS Clinical Pharmacist [phone] 01/10/2020 3:40 PM

## 2020-01-11 ENCOUNTER — Inpatient Hospital Stay (HOSPITAL_COMMUNITY): Payer: No Typology Code available for payment source

## 2020-01-11 ENCOUNTER — Telehealth: Payer: Self-pay

## 2020-01-11 ENCOUNTER — Inpatient Hospital Stay (HOSPITAL_COMMUNITY): Payer: No Typology Code available for payment source | Admitting: Anesthesiology

## 2020-01-11 ENCOUNTER — Inpatient Hospital Stay (HOSPITAL_COMMUNITY): Payer: No Typology Code available for payment source | Admitting: Certified Registered"

## 2020-01-11 ENCOUNTER — Encounter (HOSPITAL_COMMUNITY): Admission: EM | Disposition: E | Payer: Self-pay | Source: Home / Self Care | Attending: Internal Medicine

## 2020-01-11 DIAGNOSIS — I5021 Acute systolic (congestive) heart failure: Secondary | ICD-10-CM | POA: Diagnosis not present

## 2020-01-11 DIAGNOSIS — I462 Cardiac arrest due to underlying cardiac condition: Secondary | ICD-10-CM

## 2020-01-11 DIAGNOSIS — I9789 Other postprocedural complications and disorders of the circulatory system, not elsewhere classified: Secondary | ICD-10-CM

## 2020-01-11 DIAGNOSIS — Z95811 Presence of heart assist device: Secondary | ICD-10-CM | POA: Diagnosis not present

## 2020-01-11 DIAGNOSIS — J9601 Acute respiratory failure with hypoxia: Secondary | ICD-10-CM | POA: Diagnosis not present

## 2020-01-11 HISTORY — PX: INSERTION OF DIALYSIS CATHETER: SHX1324

## 2020-01-11 HISTORY — PX: TEE WITHOUT CARDIOVERSION: SHX5443

## 2020-01-11 LAB — POCT I-STAT 7, (LYTES, BLD GAS, ICA,H+H)
Acid-base deficit: 6 mmol/L — ABNORMAL HIGH (ref 0.0–2.0)
Acid-base deficit: 7 mmol/L — ABNORMAL HIGH (ref 0.0–2.0)
Acid-base deficit: 5 mmol/L — ABNORMAL HIGH (ref 0.0–2.0)
Acid-base deficit: 7 mmol/L — ABNORMAL HIGH (ref 0.0–2.0)
Acid-base deficit: 9 mmol/L — ABNORMAL HIGH (ref 0.0–2.0)
Acid-base deficit: 7 mmol/L — ABNORMAL HIGH (ref 0.0–2.0)
Acid-base deficit: 5 mmol/L — ABNORMAL HIGH (ref 0.0–2.0)
Acid-base deficit: 3 mmol/L — ABNORMAL HIGH (ref 0.0–2.0)
Acid-base deficit: 6 mmol/L — ABNORMAL HIGH (ref 0.0–2.0)
Acid-base deficit: 3 mmol/L — ABNORMAL HIGH (ref 0.0–2.0)
Bicarbonate: 20 mmol/L (ref 20.0–28.0)
Bicarbonate: 20.4 mmol/L (ref 20.0–28.0)
Bicarbonate: 20.7 mmol/L (ref 20.0–28.0)
Bicarbonate: 19.2 mmol/L — ABNORMAL LOW (ref 20.0–28.0)
Bicarbonate: 20.8 mmol/L (ref 20.0–28.0)
Bicarbonate: 20 mmol/L (ref 20.0–28.0)
Bicarbonate: 20.6 mmol/L (ref 20.0–28.0)
Bicarbonate: 21.9 mmol/L (ref 20.0–28.0)
Bicarbonate: 21.5 mmol/L (ref 20.0–28.0)
Bicarbonate: 23.1 mmol/L (ref 20.0–28.0)
Calcium, Ion: 1.02 mmol/L — ABNORMAL LOW (ref 1.15–1.40)
Calcium, Ion: 0.92 mmol/L — ABNORMAL LOW (ref 1.15–1.40)
Calcium, Ion: 0.89 mmol/L — CL (ref 1.15–1.40)
Calcium, Ion: 0.74 mmol/L — CL (ref 1.15–1.40)
Calcium, Ion: 0.8 mmol/L — CL (ref 1.15–1.40)
Calcium, Ion: 0.85 mmol/L — CL (ref 1.15–1.40)
Calcium, Ion: 0.8 mmol/L — CL (ref 1.15–1.40)
Calcium, Ion: 0.78 mmol/L — CL (ref 1.15–1.40)
Calcium, Ion: 1.07 mmol/L — ABNORMAL LOW (ref 1.15–1.40)
Calcium, Ion: 0.96 mmol/L — ABNORMAL LOW (ref 1.15–1.40)
HCT: 21 % — ABNORMAL LOW (ref 39.0–52.0)
HCT: 16 % — ABNORMAL LOW (ref 39.0–52.0)
HCT: 19 % — ABNORMAL LOW (ref 39.0–52.0)
HCT: 20 % — ABNORMAL LOW (ref 39.0–52.0)
HCT: 24 % — ABNORMAL LOW (ref 39.0–52.0)
HCT: 19 % — ABNORMAL LOW (ref 39.0–52.0)
HCT: 29 % — ABNORMAL LOW (ref 39.0–52.0)
HCT: 18 % — ABNORMAL LOW (ref 39.0–52.0)
HCT: 26 % — ABNORMAL LOW (ref 39.0–52.0)
HCT: 30 % — ABNORMAL LOW (ref 39.0–52.0)
Hemoglobin: 7.1 g/dL — ABNORMAL LOW (ref 13.0–17.0)
Hemoglobin: 5.4 g/dL — CL (ref 13.0–17.0)
Hemoglobin: 6.5 g/dL — CL (ref 13.0–17.0)
Hemoglobin: 6.8 g/dL — CL (ref 13.0–17.0)
Hemoglobin: 8.2 g/dL — ABNORMAL LOW (ref 13.0–17.0)
Hemoglobin: 6.5 g/dL — CL (ref 13.0–17.0)
Hemoglobin: 9.9 g/dL — ABNORMAL LOW (ref 13.0–17.0)
Hemoglobin: 6.1 g/dL — CL (ref 13.0–17.0)
Hemoglobin: 8.8 g/dL — ABNORMAL LOW (ref 13.0–17.0)
Hemoglobin: 10.2 g/dL — ABNORMAL LOW (ref 13.0–17.0)
O2 Saturation: 92 %
O2 Saturation: 90 %
O2 Saturation: 99 %
O2 Saturation: 100 %
O2 Saturation: 99 %
O2 Saturation: 100 %
O2 Saturation: 100 %
O2 Saturation: 99 %
O2 Saturation: 96 %
O2 Saturation: 94 %
Patient temperature: 36.8
Patient temperature: 36.9
Patient temperature: 37.3
Patient temperature: 36.2
Patient temperature: 35.4
Patient temperature: 34.5
Patient temperature: 35.7
Potassium: 5.1 mmol/L (ref 3.5–5.1)
Potassium: 5 mmol/L (ref 3.5–5.1)
Potassium: 6.3 mmol/L (ref 3.5–5.1)
Potassium: 5.9 mmol/L — ABNORMAL HIGH (ref 3.5–5.1)
Potassium: 6.1 mmol/L — ABNORMAL HIGH (ref 3.5–5.1)
Potassium: 5.6 mmol/L — ABNORMAL HIGH (ref 3.5–5.1)
Potassium: 7.9 mmol/L (ref 3.5–5.1)
Potassium: 7 mmol/L (ref 3.5–5.1)
Potassium: 7.9 mmol/L (ref 3.5–5.1)
Potassium: 7.9 mmol/L (ref 3.5–5.1)
Sodium: 142 mmol/L (ref 135–145)
Sodium: 145 mmol/L (ref 135–145)
Sodium: 143 mmol/L (ref 135–145)
Sodium: 142 mmol/L (ref 135–145)
Sodium: 142 mmol/L (ref 135–145)
Sodium: 142 mmol/L (ref 135–145)
Sodium: 139 mmol/L (ref 135–145)
Sodium: 144 mmol/L (ref 135–145)
Sodium: 139 mmol/L (ref 135–145)
Sodium: 138 mmol/L (ref 135–145)
TCO2: 21 mmol/L — ABNORMAL LOW (ref 22–32)
TCO2: 22 mmol/L (ref 22–32)
TCO2: 22 mmol/L (ref 22–32)
TCO2: 21 mmol/L — ABNORMAL LOW (ref 22–32)
TCO2: 22 mmol/L (ref 22–32)
TCO2: 21 mmol/L — ABNORMAL LOW (ref 22–32)
TCO2: 22 mmol/L (ref 22–32)
TCO2: 23 mmol/L (ref 22–32)
TCO2: 23 mmol/L (ref 22–32)
TCO2: 25 mmol/L (ref 22–32)
pCO2 arterial: 40.3 mmHg (ref 32.0–48.0)
pCO2 arterial: 49.5 mmHg — ABNORMAL HIGH (ref 32.0–48.0)
pCO2 arterial: 43.9 mmHg (ref 32.0–48.0)
pCO2 arterial: 51.6 mmHg — ABNORMAL HIGH (ref 32.0–48.0)
pCO2 arterial: 53.4 mmHg — ABNORMAL HIGH (ref 32.0–48.0)
pCO2 arterial: 50.5 mmHg — ABNORMAL HIGH (ref 32.0–48.0)
pCO2 arterial: 40.4 mmHg (ref 32.0–48.0)
pCO2 arterial: 36.5 mmHg (ref 32.0–48.0)
pCO2 arterial: 43.6 mmHg (ref 32.0–48.0)
pCO2 arterial: 43.5 mmHg (ref 32.0–48.0)
pH, Arterial: 7.302 — ABNORMAL LOW (ref 7.350–7.450)
pH, Arterial: 7.222 — ABNORMAL LOW (ref 7.350–7.450)
pH, Arterial: 7.283 — ABNORMAL LOW (ref 7.350–7.450)
pH, Arterial: 7.179 — CL (ref 7.350–7.450)
pH, Arterial: 7.197 — CL (ref 7.350–7.450)
pH, Arterial: 7.205 — ABNORMAL LOW (ref 7.350–7.450)
pH, Arterial: 7.313 — ABNORMAL LOW (ref 7.350–7.450)
pH, Arterial: 7.38 (ref 7.350–7.450)
pH, Arterial: 7.288 — ABNORMAL LOW (ref 7.350–7.450)
pH, Arterial: 7.327 — ABNORMAL LOW (ref 7.350–7.450)
pO2, Arterial: 70 mmHg — ABNORMAL LOW (ref 83.0–108.0)
pO2, Arterial: 70 mmHg — ABNORMAL LOW (ref 83.0–108.0)
pO2, Arterial: 134 mmHg — ABNORMAL HIGH (ref 83.0–108.0)
pO2, Arterial: 154 mmHg — ABNORMAL HIGH (ref 83.0–108.0)
pO2, Arterial: 410 mmHg — ABNORMAL HIGH (ref 83.0–108.0)
pO2, Arterial: 335 mmHg — ABNORMAL HIGH (ref 83.0–108.0)
pO2, Arterial: 227 mmHg — ABNORMAL HIGH (ref 83.0–108.0)
pO2, Arterial: 159 mmHg — ABNORMAL HIGH (ref 83.0–108.0)
pO2, Arterial: 78 mmHg — ABNORMAL LOW (ref 83.0–108.0)
pO2, Arterial: 70 mmHg — ABNORMAL LOW (ref 83.0–108.0)

## 2020-01-11 LAB — POCT I-STAT, CHEM 8
BUN: 51 mg/dL — ABNORMAL HIGH (ref 8–23)
BUN: 54 mg/dL — ABNORMAL HIGH (ref 8–23)
Calcium, Ion: 0.75 mmol/L — CL (ref 1.15–1.40)
Calcium, Ion: 0.8 mmol/L — CL (ref 1.15–1.40)
Chloride: 105 mmol/L (ref 98–111)
Chloride: 101 mmol/L (ref 98–111)
Creatinine, Ser: 3.4 mg/dL — ABNORMAL HIGH (ref 0.61–1.24)
Creatinine, Ser: 3.8 mg/dL — ABNORMAL HIGH (ref 0.61–1.24)
Glucose, Bld: 68 mg/dL — ABNORMAL LOW (ref 70–99)
Glucose, Bld: 114 mg/dL — ABNORMAL HIGH (ref 70–99)
HCT: 21 % — ABNORMAL LOW (ref 39.0–52.0)
HCT: 28 % — ABNORMAL LOW (ref 39.0–52.0)
Hemoglobin: 7.1 g/dL — ABNORMAL LOW (ref 13.0–17.0)
Hemoglobin: 9.5 g/dL — ABNORMAL LOW (ref 13.0–17.0)
Potassium: 5.8 mmol/L — ABNORMAL HIGH (ref 3.5–5.1)
Potassium: 7.5 mmol/L (ref 3.5–5.1)
Sodium: 142 mmol/L (ref 135–145)
Sodium: 138 mmol/L (ref 135–145)
TCO2: 20 mmol/L — ABNORMAL LOW (ref 22–32)
TCO2: 20 mmol/L — ABNORMAL LOW (ref 22–32)

## 2020-01-11 LAB — COMPREHENSIVE METABOLIC PANEL
ALT: 3466 U/L — ABNORMAL HIGH (ref 0–44)
ALT: 3086 U/L — ABNORMAL HIGH (ref 0–44)
AST: 5615 U/L — ABNORMAL HIGH (ref 15–41)
AST: 6642 U/L — ABNORMAL HIGH (ref 15–41)
Albumin: 2.4 g/dL — ABNORMAL LOW (ref 3.5–5.0)
Albumin: 2 g/dL — ABNORMAL LOW (ref 3.5–5.0)
Alkaline Phosphatase: 66 U/L (ref 38–126)
Alkaline Phosphatase: 121 U/L (ref 38–126)
Anion gap: 18 — ABNORMAL HIGH (ref 5–15)
Anion gap: 18 — ABNORMAL HIGH (ref 5–15)
BUN: 49 mg/dL — ABNORMAL HIGH (ref 8–23)
BUN: 51 mg/dL — ABNORMAL HIGH (ref 8–23)
CO2: 17 mmol/L — ABNORMAL LOW (ref 22–32)
CO2: 18 mmol/L — ABNORMAL LOW (ref 22–32)
Calcium: 7.4 mg/dL — ABNORMAL LOW (ref 8.9–10.3)
Calcium: 6 mg/dL — CL (ref 8.9–10.3)
Chloride: 108 mmol/L (ref 98–111)
Chloride: 105 mmol/L (ref 98–111)
Creatinine, Ser: 2.83 mg/dL — ABNORMAL HIGH (ref 0.61–1.24)
Creatinine, Ser: 3.03 mg/dL — ABNORMAL HIGH (ref 0.61–1.24)
GFR, Estimated: 22 mL/min — ABNORMAL LOW (ref >60)
GFR, Estimated: 22 mL/min — ABNORMAL LOW (ref >60)
Glucose, Bld: 68 mg/dL — ABNORMAL LOW (ref 70–99)
Glucose, Bld: 114 mg/dL — ABNORMAL HIGH (ref 70–99)
Potassium: 5.2 mmol/L — ABNORMAL HIGH (ref 3.5–5.1)
Potassium: 7.3 mmol/L (ref 3.5–5.1)
Sodium: 143 mmol/L (ref 135–145)
Sodium: 141 mmol/L (ref 135–145)
Total Bilirubin: 2.7 mg/dL — ABNORMAL HIGH (ref 0.3–1.2)
Total Bilirubin: 3.2 mg/dL — ABNORMAL HIGH (ref 0.3–1.2)
Total Protein: 4 g/dL — ABNORMAL LOW (ref 6.5–8.1)
Total Protein: 3.3 g/dL — ABNORMAL LOW (ref 6.5–8.1)

## 2020-01-11 LAB — RENAL FUNCTION PANEL
Albumin: 2.3 g/dL — ABNORMAL LOW (ref 3.5–5.0)
Anion gap: 21 — ABNORMAL HIGH (ref 5–15)
BUN: 47 mg/dL — ABNORMAL HIGH (ref 8–23)
CO2: 18 mmol/L — ABNORMAL LOW (ref 22–32)
Calcium: 8.3 mg/dL — ABNORMAL LOW (ref 8.9–10.3)
Chloride: 103 mmol/L (ref 98–111)
Creatinine, Ser: 2.78 mg/dL — ABNORMAL HIGH (ref 0.61–1.24)
GFR, Estimated: 24 mL/min — ABNORMAL LOW (ref >60)
Glucose, Bld: 159 mg/dL — ABNORMAL HIGH (ref 70–99)
Phosphorus: >30 mg/dL — ABNORMAL HIGH (ref 2.5–4.6)
Potassium: >7.5 mmol/L (ref 3.5–5.1)
Sodium: 142 mmol/L (ref 135–145)

## 2020-01-11 LAB — CBC
HCT: 23.8 % — ABNORMAL LOW (ref 39.0–52.0)
HCT: 33.7 % — ABNORMAL LOW (ref 39.0–52.0)
HCT: 26.3 % — ABNORMAL LOW (ref 39.0–52.0)
HCT: 35.4 % — ABNORMAL LOW (ref 39.0–52.0)
Hemoglobin: 7.8 g/dL — ABNORMAL LOW (ref 13.0–17.0)
Hemoglobin: 10.8 g/dL — ABNORMAL LOW (ref 13.0–17.0)
Hemoglobin: 8.5 g/dL — ABNORMAL LOW (ref 13.0–17.0)
Hemoglobin: 11.9 g/dL — ABNORMAL LOW (ref 13.0–17.0)
MCH: 30.7 pg (ref 26.0–34.0)
MCH: 29.8 pg (ref 26.0–34.0)
MCH: 30.6 pg (ref 26.0–34.0)
MCH: 30.1 pg (ref 26.0–34.0)
MCHC: 32.8 g/dL (ref 30.0–36.0)
MCHC: 32 g/dL (ref 30.0–36.0)
MCHC: 32.3 g/dL (ref 30.0–36.0)
MCHC: 33.6 g/dL (ref 30.0–36.0)
MCV: 93.7 fL (ref 80.0–100.0)
MCV: 93.1 fL (ref 80.0–100.0)
MCV: 94.6 fL (ref 80.0–100.0)
MCV: 89.6 fL (ref 80.0–100.0)
Platelets: 33 10*3/uL — ABNORMAL LOW (ref 150–400)
Platelets: 47 10*3/uL — ABNORMAL LOW (ref 150–400)
Platelets: 114 10*3/uL — ABNORMAL LOW (ref 150–400)
Platelets: 104 10*3/uL — ABNORMAL LOW (ref 150–400)
RBC: 2.54 MIL/uL — ABNORMAL LOW (ref 4.22–5.81)
RBC: 3.62 MIL/uL — ABNORMAL LOW (ref 4.22–5.81)
RBC: 2.78 MIL/uL — ABNORMAL LOW (ref 4.22–5.81)
RBC: 3.95 MIL/uL — ABNORMAL LOW (ref 4.22–5.81)
RDW: 15.2 % (ref 11.5–15.5)
RDW: 14.9 % (ref 11.5–15.5)
RDW: 15 % (ref 11.5–15.5)
RDW: 14.7 % (ref 11.5–15.5)
WBC: 18.8 10*3/uL — ABNORMAL HIGH (ref 4.0–10.5)
WBC: 11.5 10*3/uL — ABNORMAL HIGH (ref 4.0–10.5)
WBC: 10.3 10*3/uL (ref 4.0–10.5)
WBC: 12.7 10*3/uL — ABNORMAL HIGH (ref 4.0–10.5)
nRBC: 0.4 % — ABNORMAL HIGH (ref 0.0–0.2)
nRBC: 2.4 % — ABNORMAL HIGH (ref 0.0–0.2)
nRBC: 4.7 % — ABNORMAL HIGH (ref 0.0–0.2)
nRBC: 5.7 % — ABNORMAL HIGH (ref 0.0–0.2)

## 2020-01-11 LAB — BPAM RBC
Blood Product Expiration Date: 202111182359
Blood Product Expiration Date: 202111192359
Blood Product Expiration Date: 202111192359
Blood Product Expiration Date: 202111202359
Blood Product Expiration Date: 202111212359
Blood Product Expiration Date: 202111222359
Blood Product Expiration Date: 202111222359
ISSUE DATE / TIME: 202110181038
ISSUE DATE / TIME: 202110181038
ISSUE DATE / TIME: 202110190117
ISSUE DATE / TIME: 202110190844
ISSUE DATE / TIME: 202110190930
ISSUE DATE / TIME: 202110191900
ISSUE DATE / TIME: 202110201000
Unit Type and Rh: 5100
Unit Type and Rh: 5100
Unit Type and Rh: 5100
Unit Type and Rh: 5100
Unit Type and Rh: 5100
Unit Type and Rh: 5100
Unit Type and Rh: 5100

## 2020-01-11 LAB — LACTIC ACID, PLASMA
Lactic Acid, Venous: 10.1 mmol/L (ref 0.5–1.9)
Lactic Acid, Venous: 10.9 mmol/L (ref 0.5–1.9)

## 2020-01-11 LAB — GLUCOSE, CAPILLARY
Glucose-Capillary: 109 mg/dL — ABNORMAL HIGH (ref 70–99)
Glucose-Capillary: 125 mg/dL — ABNORMAL HIGH (ref 70–99)
Glucose-Capillary: 133 mg/dL — ABNORMAL HIGH (ref 70–99)
Glucose-Capillary: 137 mg/dL — ABNORMAL HIGH (ref 70–99)
Glucose-Capillary: 137 mg/dL — ABNORMAL HIGH (ref 70–99)
Glucose-Capillary: 65 mg/dL — ABNORMAL LOW (ref 70–99)
Glucose-Capillary: 98 mg/dL (ref 70–99)
Glucose-Capillary: 105 mg/dL — ABNORMAL HIGH (ref 70–99)
Glucose-Capillary: 109 mg/dL — ABNORMAL HIGH (ref 70–99)
Glucose-Capillary: 143 mg/dL — ABNORMAL HIGH (ref 70–99)
Glucose-Capillary: 162 mg/dL — ABNORMAL HIGH (ref 70–99)

## 2020-01-11 LAB — COOXEMETRY PANEL
Carboxyhemoglobin: 0.6 % (ref 0.5–1.5)
Carboxyhemoglobin: 0.6 % (ref 0.5–1.5)
Carboxyhemoglobin: 0.7 % (ref 0.5–1.5)
Methemoglobin: 0.9 % (ref 0.0–1.5)
Methemoglobin: 1.1 % (ref 0.0–1.5)
Methemoglobin: 1.1 % (ref 0.0–1.5)
O2 Saturation: 59.1 %
O2 Saturation: 72.4 %
O2 Saturation: 70.7 %
Total hemoglobin: 7.2 g/dL — ABNORMAL LOW (ref 12.0–16.0)
Total hemoglobin: 9.6 g/dL — ABNORMAL LOW (ref 12.0–16.0)
Total hemoglobin: 12.5 g/dL (ref 12.0–16.0)

## 2020-01-11 LAB — POTASSIUM
Potassium: >7.5 mmol/L (ref 3.5–5.1)
Potassium: >7.5 mmol/L (ref 3.5–5.1)

## 2020-01-11 LAB — TYPE AND SCREEN
ABO/RH(D): O POS
Antibody Screen: NEGATIVE
Unit division: 0
Unit division: 0
Unit division: 0
Unit division: 0
Unit division: 0
Unit division: 0
Unit division: 0

## 2020-01-11 LAB — ECHOCARDIOGRAM LIMITED
Height: 70 in
Weight: 2328.06 oz

## 2020-01-11 LAB — PREPARE RBC (CROSSMATCH)

## 2020-01-11 LAB — ECHO INTRAOPERATIVE TEE
Height: 70 in
Weight: 2328.06 oz

## 2020-01-11 LAB — CORTISOL: Cortisol, Plasma: 98.7 ug/dL

## 2020-01-11 LAB — PROCALCITONIN: Procalcitonin: 29.26 ng/mL

## 2020-01-11 LAB — APTT
aPTT: 75 seconds — ABNORMAL HIGH (ref 24–36)
aPTT: 68 seconds — ABNORMAL HIGH (ref 24–36)
aPTT: 61 seconds — ABNORMAL HIGH (ref 24–36)

## 2020-01-11 LAB — LACTATE DEHYDROGENASE: LDH: 4234 U/L — ABNORMAL HIGH (ref 98–192)

## 2020-01-11 LAB — PROTIME-INR
INR: 4.1 (ref 0.8–1.2)
Prothrombin Time: 38.3 seconds — ABNORMAL HIGH (ref 11.4–15.2)

## 2020-01-11 SURGERY — REDO STERNOTOMY
Anesthesia: General | Site: Chest

## 2020-01-11 MED ORDER — SODIUM ZIRCONIUM CYCLOSILICATE 10 G PO PACK
10.0000 g | PACK | Freq: Once | ORAL | Status: AC
  Administered 2020-01-11: 10 g
  Filled 2020-01-11: qty 1

## 2020-01-11 MED ORDER — SODIUM CHLORIDE 0.9 % IV SOLN
0.0200 mg/kg/h | INTRAVENOUS | Status: DC
  Filled 2020-01-11: qty 250

## 2020-01-11 MED ORDER — INSULIN ASPART 100 UNIT/ML IV SOLN
5.0000 [IU] | Freq: Once | INTRAVENOUS | Status: AC
  Administered 2020-01-11: 5 [IU] via INTRAVENOUS

## 2020-01-11 MED ORDER — SODIUM CHLORIDE 0.9 % FOR CRRT: INTRAVENOUS_CENTRAL | Status: DC | PRN

## 2020-01-11 MED ORDER — HEPARIN SODIUM (PORCINE) 1000 UNIT/ML DIALYSIS: 1000.0000 [IU] | INTRAMUSCULAR | Status: DC | PRN

## 2020-01-11 MED ORDER — CALCIUM CHLORIDE 10 % IV SOLN
INTRAVENOUS | Status: DC | PRN
  Administered 2020-01-11 (×2): 200 mg via INTRAVENOUS

## 2020-01-11 MED ORDER — PRISMASOL BGK 0/2.5 32-2.5 MEQ/L REPLACEMENT SOLN
Status: DC
  Filled 2020-01-11 (×4): qty 5000

## 2020-01-11 MED ORDER — ACETAMINOPHEN 160 MG/5ML PO SOLN
1000.0000 mg | Freq: Four times a day (QID) | ORAL | Status: AC
  Administered 2020-01-11 – 2020-01-13 (×6): 1000 mg
  Filled 2020-01-11 (×7): qty 40.6

## 2020-01-11 MED ORDER — ALBUMIN HUMAN 5 % IV SOLN
12.5000 g | Freq: Once | INTRAVENOUS | Status: AC
  Administered 2020-01-11: 12.5 g via INTRAVENOUS

## 2020-01-11 MED ORDER — SODIUM ZIRCONIUM CYCLOSILICATE 10 G PO PACK
10.0000 g | PACK | Freq: Three times a day (TID) | ORAL | Status: DC
  Administered 2020-01-11: 10 g
  Filled 2020-01-11 (×2): qty 1

## 2020-01-11 MED ORDER — PRISMASOL BGK 0/2.5 32-2.5 MEQ/L IV SOLN
INTRAVENOUS | Status: DC
  Filled 2020-01-11 (×11): qty 5000

## 2020-01-11 MED ORDER — SODIUM BICARBONATE 8.4 % IV SOLN
50.0000 meq | Freq: Once | INTRAVENOUS | Status: AC
  Administered 2020-01-11: 50 meq via INTRAVENOUS
  Filled 2020-01-11: qty 50

## 2020-01-11 MED ORDER — SODIUM BICARBONATE 8.4 % IV SOLN
INTRAVENOUS | Status: AC
  Filled 2020-01-11: qty 100

## 2020-01-11 MED ORDER — ANTICOAGULANT SODIUM CITRATE 4% (200MG/5ML) IV SOLN
5.0000 mL | Status: DC | PRN
  Filled 2020-01-11: qty 5

## 2020-01-11 MED ORDER — CALCIUM CHLORIDE 10 % IV SOLN
1.0000 g | Freq: Once | INTRAVENOUS | Status: AC
  Administered 2020-01-11: 1 g via INTRAVENOUS

## 2020-01-11 MED ORDER — CHLORHEXIDINE GLUCONATE 0.12% ORAL RINSE (MEDLINE KIT)
15.0000 mL | Freq: Two times a day (BID) | OROMUCOSAL | Status: DC
  Administered 2020-01-11 – 2020-01-13 (×5): 15 mL via OROMUCOSAL

## 2020-01-11 MED ORDER — ATORVASTATIN CALCIUM 80 MG PO TABS: 80.0000 mg | ORAL_TABLET | Freq: Every day | ORAL | Status: DC

## 2020-01-11 MED ORDER — SODIUM BICARBONATE 8.4 % IV SOLN
50.0000 meq | Freq: Once | INTRAVENOUS | Status: AC
  Administered 2020-01-11: 50 meq via INTRAVENOUS

## 2020-01-11 MED ORDER — CALCIUM GLUCONATE-NACL 1-0.675 GM/50ML-% IV SOLN
1.0000 g | INTRAVENOUS | Status: AC
  Administered 2020-01-11: 1000 mg via INTRAVENOUS
  Filled 2020-01-11: qty 50

## 2020-01-11 MED ORDER — FAMOTIDINE IN NACL 20-0.9 MG/50ML-% IV SOLN
20.0000 mg | INTRAVENOUS | Status: AC
  Administered 2020-01-12: 20 mg via INTRAVENOUS
  Filled 2020-01-11: qty 50

## 2020-01-11 MED ORDER — SODIUM CHLORIDE 0.9 % IV SOLN: 2.0000 g | INTRAVENOUS | Status: DC

## 2020-01-11 MED ORDER — VANCOMYCIN HCL IN DEXTROSE 1-5 GM/200ML-% IV SOLN: 1000.0000 mg | INTRAVENOUS | Status: DC

## 2020-01-11 MED ORDER — ALBUMIN HUMAN 5 % IV SOLN
INTRAVENOUS | Status: AC
  Filled 2020-01-11: qty 250

## 2020-01-11 MED ORDER — PLASMA-LYTE 148 IV SOLN
INTRAVENOUS | Status: DC | PRN
  Administered 2020-01-11: 500 mL via INTRAVASCULAR

## 2020-01-11 MED ORDER — STERILE WATER FOR INJECTION IV SOLN
INTRAVENOUS | Status: DC
  Filled 2020-01-11 (×19): qty 150

## 2020-01-11 MED ORDER — PRISMASOL BGK 0/2.5 32-2.5 MEQ/L REPLACEMENT SOLN
Status: DC
  Filled 2020-01-11 (×2): qty 5000

## 2020-01-11 MED ORDER — MIDAZOLAM HCL 2 MG/2ML IJ SOLN: 1.0000 mg | INTRAMUSCULAR | Status: DC | PRN

## 2020-01-11 MED ORDER — VASOPRESSIN 20 UNIT/ML IV SOLN
INTRAVENOUS | Status: DC | PRN
  Administered 2020-01-11 (×5): 1 [IU] via INTRAVENOUS

## 2020-01-11 MED ORDER — HEMOSTATIC AGENTS (NO CHARGE) OPTIME
TOPICAL | Status: DC | PRN
  Administered 2020-01-11 (×2): 1 via TOPICAL

## 2020-01-11 MED ORDER — HEPARIN SODIUM (PORCINE) 1000 UNIT/ML IJ SOLN
INTRAMUSCULAR | Status: AC
  Filled 2020-01-11: qty 1

## 2020-01-11 MED ORDER — SODIUM CHLORIDE 0.9 % IV SOLN: INTRAVENOUS | Status: DC | PRN

## 2020-01-11 MED ORDER — PRISMASOL BGK 0/2.5 32-2.5 MEQ/L IV SOLN
INTRAVENOUS | Status: DC
  Filled 2020-01-11 (×6): qty 5000

## 2020-01-11 MED ORDER — ANTICOAGULANT SODIUM CITRATE 4% (200MG/5ML) IV SOLN
Status: AC | PRN
  Administered 2020-01-11: 4.2 mL

## 2020-01-11 MED ORDER — CALCIUM CHLORIDE 10 % IV SOLN
INTRAVENOUS | Status: AC
  Filled 2020-01-11: qty 10

## 2020-01-11 MED ORDER — FENTANYL BOLUS VIA INFUSION
25.0000 ug | INTRAVENOUS | Status: DC | PRN
  Filled 2020-01-11: qty 25

## 2020-01-11 MED ORDER — FENTANYL CITRATE (PF) 100 MCG/2ML IJ SOLN: 25.0000 ug | Freq: Once | INTRAMUSCULAR | Status: DC

## 2020-01-11 MED ORDER — SODIUM BICARBONATE 8.4 % IV SOLN
INTRAVENOUS | Status: AC
  Administered 2020-01-11: 50 meq
  Filled 2020-01-11: qty 50

## 2020-01-11 MED ORDER — EPINEPHRINE 1 MG/10ML IJ SOSY
PREFILLED_SYRINGE | INTRAMUSCULAR | Status: DC | PRN
  Administered 2020-01-11 (×3): .1 mg via INTRAVENOUS

## 2020-01-11 MED ORDER — ACETAMINOPHEN 500 MG PO TABS
1000.0000 mg | ORAL_TABLET | Freq: Four times a day (QID) | ORAL | Status: AC
  Filled 2020-01-11: qty 2

## 2020-01-11 MED ORDER — DEXTROSE 50 % IV SOLN
12.5000 g | INTRAVENOUS | Status: AC
  Administered 2020-01-11: 12.5 g via INTRAVENOUS

## 2020-01-11 MED ORDER — 0.9 % SODIUM CHLORIDE (POUR BTL) OPTIME
TOPICAL | Status: DC | PRN
  Administered 2020-01-11: 5000 mL

## 2020-01-11 MED ORDER — LACTATED RINGERS IV SOLN: INTRAVENOUS | Status: DC | PRN

## 2020-01-11 MED ORDER — FENTANYL 2500MCG IN NS 250ML (10MCG/ML) PREMIX INFUSION
25.0000 ug/h | INTRAVENOUS | Status: DC
  Administered 2020-01-11: 25 ug/h via INTRAVENOUS
  Administered 2020-01-12 – 2020-01-13 (×2): 100 ug/h via INTRAVENOUS
  Filled 2020-01-11 (×3): qty 250

## 2020-01-11 MED ORDER — SODIUM BICARBONATE 8.4 % IV SOLN
INTRAVENOUS | Status: DC
  Administered 2020-01-11: 75 mL/h via INTRAVENOUS
  Filled 2020-01-11 (×3): qty 850

## 2020-01-11 MED ORDER — FUROSEMIDE 10 MG/ML IJ SOLN
INTRAMUSCULAR | Status: DC | PRN
  Administered 2020-01-11: 40 mg via INTRAMUSCULAR

## 2020-01-11 MED ORDER — FUROSEMIDE 10 MG/ML IJ SOLN
INTRAMUSCULAR | Status: AC
  Filled 2020-01-11: qty 4

## 2020-01-11 MED ORDER — SODIUM CHLORIDE 0.9% IV SOLUTION: Freq: Once | INTRAVENOUS | Status: DC

## 2020-01-11 MED ORDER — HYDROCORTISONE NA SUCCINATE PF 100 MG IJ SOLR
50.0000 mg | Freq: Four times a day (QID) | INTRAMUSCULAR | Status: DC
  Administered 2020-01-11 – 2020-01-14 (×11): 50 mg via INTRAVENOUS
  Filled 2020-01-11 (×12): qty 2

## 2020-01-11 MED ORDER — POLYETHYLENE GLYCOL 3350 17 G PO PACK
17.0000 g | PACK | Freq: Every day | ORAL | Status: DC
  Administered 2020-01-12: 17 g via ORAL
  Filled 2020-01-11: qty 1

## 2020-01-11 MED ORDER — DEXTROSE 50 % IV SOLN
1.0000 | Freq: Once | INTRAVENOUS | Status: AC
  Administered 2020-01-11: 50 mL via INTRAVENOUS
  Filled 2020-01-11: qty 50

## 2020-01-11 MED ORDER — VASOPRESSIN 20 UNIT/ML IV SOLN
INTRAVENOUS | Status: AC
  Filled 2020-01-11: qty 1

## 2020-01-11 MED ORDER — SODIUM ZIRCONIUM CYCLOSILICATE 10 G PO PACK
10.0000 g | PACK | Freq: Three times a day (TID) | ORAL | Status: DC
  Filled 2020-01-11: qty 1

## 2020-01-11 MED ORDER — SODIUM CHLORIDE 0.9 % IV SOLN
20.0000 ug | Freq: Once | INTRAVENOUS | Status: AC
  Administered 2020-01-11: 20 ug via INTRAVENOUS
  Filled 2020-01-11: qty 5

## 2020-01-11 MED ORDER — SODIUM CHLORIDE 0.9 % IV SOLN
0.0080 mg/kg/h | INTRAVENOUS | Status: DC
  Administered 2020-01-12 (×2): 0.02 mg/kg/h via INTRAVENOUS
  Filled 2020-01-11 (×2): qty 250

## 2020-01-11 MED ORDER — VANCOMYCIN HCL 1250 MG/250ML IV SOLN
1250.0000 mg | Freq: Once | INTRAVENOUS | Status: AC
  Administered 2020-01-11: 1250 mg via INTRAVENOUS
  Filled 2020-01-11: qty 250

## 2020-01-11 MED ORDER — SODIUM CHLORIDE 0.9 % IV SOLN: 1.0000 g | Freq: Once | INTRAVENOUS | Status: DC

## 2020-01-11 MED ORDER — SODIUM CHLORIDE 0.9 % IV SOLN
2.0000 g | Freq: Two times a day (BID) | INTRAVENOUS | Status: DC
  Administered 2020-01-12 – 2020-01-14 (×5): 2 g via INTRAVENOUS
  Filled 2020-01-11 (×5): qty 2

## 2020-01-11 MED ORDER — MILRINONE LACTATE IN DEXTROSE 20-5 MG/100ML-% IV SOLN: 0.3000 ug/kg/min | INTRAVENOUS | Status: DC

## 2020-01-11 MED ORDER — ORAL CARE MOUTH RINSE
15.0000 mL | OROMUCOSAL | Status: DC
  Administered 2020-01-11 – 2020-01-14 (×27): 15 mL via OROMUCOSAL

## 2020-01-11 MED ORDER — DEXTROSE 50 % IV SOLN
INTRAVENOUS | Status: AC
  Filled 2020-01-11: qty 50

## 2020-01-11 MED ORDER — SODIUM BICARBONATE 8.4 % IV SOLN
25.0000 meq | Freq: Once | INTRAVENOUS | Status: AC
  Administered 2020-01-11: 25 meq via INTRAVENOUS

## 2020-01-11 MED ORDER — ALBUMIN HUMAN 5 % IV SOLN: INTRAVENOUS | Status: DC | PRN

## 2020-01-11 MED ORDER — MILRINONE LACTATE IN DEXTROSE 20-5 MG/100ML-% IV SOLN
0.3000 ug/kg/min | INTRAVENOUS | Status: DC
  Administered 2020-01-12 – 2020-01-13 (×3): 0.3 ug/kg/min via INTRAVENOUS
  Filled 2020-01-11 (×3): qty 100

## 2020-01-11 MED ORDER — ROCURONIUM BROMIDE 10 MG/ML (PF) SYRINGE
PREFILLED_SYRINGE | INTRAVENOUS | Status: DC | PRN
  Administered 2020-01-11: 30 mg via INTRAVENOUS
  Administered 2020-01-11 (×2): 50 mg via INTRAVENOUS

## 2020-01-11 MED FILL — Sodium Chloride IV Soln 0.9%: INTRAVENOUS | Qty: 2000 | Status: AC

## 2020-01-11 SURGICAL SUPPLY — 79 items
AGENT HMST KT MTR STRL THRMB (HEMOSTASIS) ×3
ANCHOR CATH FOLEY SECURE (MISCELLANEOUS) ×15 IMPLANT
BAG DECANTER FOR FLEXI CONT (MISCELLANEOUS) ×5 IMPLANT
BLADE CORE FAN STRYKER (BLADE) ×5 IMPLANT
BLADE STERNUM SYSTEM 6 (BLADE) ×5 IMPLANT
BLADE SURG 12 STRL SS (BLADE) ×5 IMPLANT
BNDG GAUZE ELAST 4 BULKY (GAUZE/BANDAGES/DRESSINGS) ×5 IMPLANT
CANISTER SUCT 3000ML PPV (MISCELLANEOUS) ×5 IMPLANT
CANNULA GUNDRY RCSP 15FR (MISCELLANEOUS) ×5 IMPLANT
CATH CPB KIT VANTRIGT (MISCELLANEOUS) IMPLANT
CATH ROBINSON RED A/P 18FR (CATHETERS) ×10 IMPLANT
CATH THORACIC 28FR RT ANG (CATHETERS) ×15 IMPLANT
CONN 3/8X3/8 GISH STERILE (MISCELLANEOUS) ×5 IMPLANT
COVER PROBE W GEL 5X96 (DRAPES) ×5 IMPLANT
DRAIN CHANNEL 32F RND 10.7 FF (WOUND CARE) ×5 IMPLANT
DRAPE CARDIOVASCULAR INCISE (DRAPES) ×5
DRAPE SLUSH/WARMER DISC (DRAPES) ×5 IMPLANT
DRAPE SRG 135X102X78XABS (DRAPES) ×3 IMPLANT
DRSG AQUACEL AG ADV 3.5X14 (GAUZE/BANDAGES/DRESSINGS) ×5 IMPLANT
DRSG TEGADERM 4X4.5 CHG (GAUZE/BANDAGES/DRESSINGS) ×5 IMPLANT
ELECT BLADE 4.0 EZ CLEAN MEGAD (MISCELLANEOUS) ×5
ELECT BLADE 6.5 EXT (BLADE) ×5 IMPLANT
ELECT CAUTERY BLADE 6.4 (BLADE) ×5 IMPLANT
ELECT REM PT RETURN 9FT ADLT (ELECTROSURGICAL) ×10
ELECTRODE BLDE 4.0 EZ CLN MEGD (MISCELLANEOUS) ×3 IMPLANT
ELECTRODE REM PT RTRN 9FT ADLT (ELECTROSURGICAL) ×6 IMPLANT
FELT TEFLON 1X6 (MISCELLANEOUS) ×5 IMPLANT
GAUZE SPONGE 4X4 12PLY STRL (GAUZE/BANDAGES/DRESSINGS) ×5 IMPLANT
GAUZE XEROFORM 1X8 LF (GAUZE/BANDAGES/DRESSINGS) ×5 IMPLANT
GLOVE BIO SURGEON STRL SZ 6.5 (GLOVE) ×16 IMPLANT
GLOVE BIO SURGEON STRL SZ7.5 (GLOVE) ×15 IMPLANT
GLOVE BIO SURGEONS STRL SZ 6.5 (GLOVE) ×4
GLOVE BIOGEL PI IND STRL 6.5 (GLOVE) ×6 IMPLANT
GLOVE BIOGEL PI INDICATOR 6.5 (GLOVE) ×4
GOWN STRL REUS W/ TWL LRG LVL3 (GOWN DISPOSABLE) ×12 IMPLANT
GOWN STRL REUS W/TWL LRG LVL3 (GOWN DISPOSABLE) ×20
HEMOSTAT POWDER SURGIFOAM 1G (HEMOSTASIS) IMPLANT
HEMOSTAT SURGICEL 2X14 (HEMOSTASIS) ×5 IMPLANT
INSERT FOGARTY XLG (MISCELLANEOUS) IMPLANT
KIT BASIN OR (CUSTOM PROCEDURE TRAY) ×5 IMPLANT
KIT CATH DIALYSIS TRI 20X13 (CATHETERS) ×5 IMPLANT
KIT SUCTION CATH 14FR (SUCTIONS) ×5 IMPLANT
KIT TURNOVER KIT B (KITS) ×5 IMPLANT
LEAD PACING MYOCARDI (MISCELLANEOUS) ×5 IMPLANT
MARKER GRAFT CORONARY BYPASS (MISCELLANEOUS) IMPLANT
NS IRRIG 1000ML POUR BTL (IV SOLUTION) ×25 IMPLANT
PACK E OPEN HEART (SUTURE) ×5 IMPLANT
PACK OPEN HEART (CUSTOM PROCEDURE TRAY) ×5 IMPLANT
PAD ELECT DEFIB RADIOL ZOLL (MISCELLANEOUS) ×5 IMPLANT
PENCIL BUTTON HOLSTER BLD 10FT (ELECTRODE) ×5 IMPLANT
POSITIONER HEAD DONUT 9IN (MISCELLANEOUS) ×5 IMPLANT
STOPCOCK 3 WAY HIGH PRESSURE (MISCELLANEOUS) ×5
STOPCOCK 3WAY HIGH PRESSURE (MISCELLANEOUS) ×3 IMPLANT
SURGIFLO W/THROMBIN 8M KIT (HEMOSTASIS) ×5 IMPLANT
SUT BONE WAX W31G (SUTURE) ×5 IMPLANT
SUT PROLENE 3 0 SH DA (SUTURE) ×25 IMPLANT
SUT PROLENE 4 0 RB 1 (SUTURE) ×30
SUT PROLENE 4 0 SH DA (SUTURE) ×25 IMPLANT
SUT PROLENE 4-0 RB1 .5 CRCL 36 (SUTURE) ×18 IMPLANT
SUT PROLENE 5 0 C 1 36 (SUTURE) ×10 IMPLANT
SUT PROLENE 6 0 C 1 30 (SUTURE) IMPLANT
SUT PROLENE 6 0 CC (SUTURE) ×15 IMPLANT
SUT PROLENE 8 0 BV175 6 (SUTURE) IMPLANT
SUT PROLENE BLUE 7 0 (SUTURE) ×5 IMPLANT
SUT SILK  1 MH (SUTURE) ×5
SUT SILK 1 MH (SUTURE) ×3 IMPLANT
SUT STEEL 6MS V (SUTURE) ×5 IMPLANT
SUT STEEL SZ 6 DBL 3X14 BALL (SUTURE) ×10 IMPLANT
SUT VIC AB 1 CTX 36 (SUTURE) ×25
SUT VIC AB 1 CTX36XBRD ANBCTR (SUTURE) ×15 IMPLANT
SYSTEM SAHARA CHEST DRAIN ATS (WOUND CARE) ×10 IMPLANT
TAPE CLOTH SOFT 2X10 (GAUZE/BANDAGES/DRESSINGS) ×5 IMPLANT
TAPE CLOTH SURG 4X10 WHT LF (GAUZE/BANDAGES/DRESSINGS) ×5 IMPLANT
TOWEL GREEN STERILE (TOWEL DISPOSABLE) ×10 IMPLANT
TOWEL GREEN STERILE FF (TOWEL DISPOSABLE) ×10 IMPLANT
TRAY CATH LUMEN 1 20CM STRL (SET/KITS/TRAYS/PACK) ×5 IMPLANT
TUBING ART PRESS 48 MALE/FEM (TUBING) ×10 IMPLANT
UNDERPAD 30X36 HEAVY ABSORB (UNDERPADS AND DIAPERS) ×5 IMPLANT
WATER STERILE IRR 1000ML POUR (IV SOLUTION) ×10 IMPLANT

## 2020-01-11 NOTE — Progress Notes (Addendum)
Paged Donata Clay MD in regards to increase in pressor requirements to sustain BP. Made him aware of last hemoglobin, current CVP, CO/CI, and potassium level (critical). Orders received for 1 unit pRBCs, an amp of calcium, and to check another potassium level.  1555: Unit #[id number] (pRBCs) given from cooler of blood brought up emergently. Blood checked off with Ricard Dillon as second verifier. Blood ran at 120 mL/hr for 15 mins. VSS. Blood rate increased to 500 mL/hr for the remainder of the unit.

## 2020-01-11 NOTE — Progress Notes (Signed)
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  Have repeated short term treatment hyperkalemia meds (calc, insulin, bicarb)  lokelma tid Have maxed out dialysate flow rate at 2 liters per hour-  Will increase BFR on CRRT and change post filter fluid to bicarb only-  Already on all  zero K bath.  Thankfully is paced and seems to be improving some with other parameters-  Continue with frequent K checks   Adrian Phillips

## 2020-01-11 NOTE — Consult Note (Signed)
[facility] Renal Consultation Note  Requesting MD: Prescott Gum Indication for Consultation: AKI and hyperkalemia  HPI:  Adrian Phillips is a 69 y.o. male with past medical history significant for hypertension, hyperlipidemia, reported PAD with continued tobacco use.  However, no history of CKD with baseline creatinine around 1 or less.  Patient initially presented to the hospital on 10/11 with complaints of shortness of breath.  Echocardiogram showed LV dysfunction so patient underwent cardiac catheterization.  Multiple blockages felt to be best served by CABG that happened on 10/18 as well as placement of Impella.  Patient was having difficulty with hypotension-was developing AKI in the setting of hypotension, so was taken back to the operating room today for exploration, hematoma evacuation, TEE and also placement of right IJ dialysis catheter due to the development of AKI since surgery.  Urine output is decreasing, also development of severe hyperkalemia this afternoon-as high as 7.9, 7.5 at last check.   We are asked to assist with the acute kidney injury and hyperkalemia.  Patient is currently sedated, intubated in too hard.  Multiple vasoactive drips.  His 2 daughters are at bedside-1 is an RN-the other is also in the medical field in some capacity.    Creatinine, Ser  Date/Time Value Ref Range Status  01/17/2020 12:50 PM 3.80 (H) 0.61 - 1.24 mg/dL Final  01/19/2020 12:21 PM 3.03 (H) 0.61 - 1.24 mg/dL Final  01/20/2020 09:32 AM 3.40 (H) 0.61 - 1.24 mg/dL Final  01/18/2020 03:58 AM 2.83 (H) 0.61 - 1.24 mg/dL Final  01/10/2020 04:44 PM 2.54 (H) 0.61 - 1.24 mg/dL Final  01/10/2020 04:26 AM 2.17 (H) 0.61 - 1.24 mg/dL Final  01/09/2020 05:38 PM 1.82 (H) 0.61 - 1.24 mg/dL Final  01/09/2020 02:15 PM 1.71 (H) 0.61 - 1.24 mg/dL Final  01/09/2020 05:02 AM 1.15 0.61 - 1.24 mg/dL Final  12/22/2019 09:22 PM 1.02 0.61 - 1.24 mg/dL Final  01/03/2020 01:08 PM 0.90 0.61 - 1.24 mg/dL  Final  01/10/2020 11:54 AM 0.70 0.61 - 1.24 mg/dL Final  01/10/2020 11:32 AM 0.90 0.61 - 1.24 mg/dL Final  01/03/2020 10:30 AM 0.80 0.61 - 1.24 mg/dL Final  01/12/2020 10:03 AM 1.00 0.61 - 1.24 mg/dL Final  12/31/2019 09:30 AM 1.20 0.61 - 1.24 mg/dL Final  12/27/2019 08:06 AM 1.10 0.61 - 1.24 mg/dL Final  01/03/2020 04:00 AM 1.15 0.61 - 1.24 mg/dL Final  01/07/2020 04:04 AM 1.06 0.61 - 1.24 mg/dL Final  01/06/2020 04:10 AM 1.06 0.61 - 1.24 mg/dL Final  01/05/2020 03:45 AM 1.06 0.61 - 1.24 mg/dL Final  01/04/2020 03:00 AM 0.96 0.61 - 1.24 mg/dL Final  01/03/2020 12:31 AM 1.09 0.61 - 1.24 mg/dL Final  01/18/2020 04:19 AM 1.17 0.61 - 1.24 mg/dL Final  12/28/2019 12:36 PM 1.14 0.61 - 1.24 mg/dL Final  12/22/2019 05:52 AM 0.90 0.61 - 1.24 mg/dL Final  01/05/2020 01:51 AM 0.90 0.61 - 1.24 mg/dL Final  12/05/2019 05:19 PM 0.84 0.61 - 1.24 mg/dL Final     PMHx:   Past Medical History:  Diagnosis Date  . Diverticulosis   . Hyperlipidemia   . Hypertension   . Internal hemorrhoids   . PVD (peripheral vascular disease) ([place])     Past Surgical History:  Procedure Laterality Date  . COLONOSCOPY  12/13/2008   Dr Janet Berlin at [facility]  . CORONARY ARTERY BYPASS GRAFT N/A 12/22/2019   Procedure: CORONARY ARTERY BYPASS GRAFTING (CABG) x 3  USING LEFT INTERNAL MAMMARY ARTERY AND LEFT GREATER SAPHENOUS  VEIN. LIMA TO LAD, SVG TO OM1, SVG TO OM2;  Surgeon: Ivin Poot, MD;  Location: [facility];  Service: Open Heart Surgery;  Laterality: N/A;  . ENDOVEIN HARVEST OF GREATER SAPHENOUS VEIN Left 12/26/2019   Procedure: ENDOVEIN HARVEST OF GREATER SAPHENOUS VEIN;  Surgeon: Ivin Poot, MD;  Location: [facility];  Service: Open Heart Surgery;  Laterality: Left;  . INGUINAL HERNIA REPAIR Right   . PLACEMENT OF IMPELLA LEFT VENTRICULAR ASSIST DEVICE N/A 12/31/2019   Procedure: PLACEMENT OF IMPELLA LEFT VENTRICULAR ASSIST DEVICE 5.5 SN [id number];  Surgeon: Ivin Poot, MD;  Location: [facility];  Service: Open Heart Surgery;  Laterality: N/A;  . RIGHT/LEFT HEART CATH AND CORONARY ANGIOGRAPHY N/A 12/31/2019   Procedure: RIGHT/LEFT HEART CATH AND CORONARY ANGIOGRAPHY;  Surgeon: Belva Crome, MD;  Location: [facility];  Service: Cardiovascular;  Laterality: N/A;  . TEE WITHOUT CARDIOVERSION N/A 01/10/2020   Procedure: TRANSESOPHAGEAL ECHOCARDIOGRAM (TEE);  Surgeon: Prescott Gum, Collier Salina, MD;  Location: [facility];  Service: Open Heart Surgery;  Laterality: N/A;    Family Hx:  Family History  Problem Relation Age of Onset  . Colon polyps Neg Hx   . Colon cancer Neg Hx     Social History:  reports that he has been smoking cigarettes. He has never used smokeless tobacco. He reports previous alcohol use. He reports that he does not use drugs.  Allergies:  Allergies  Allergen Reactions  . Lisinopril Swelling    Angioedema in 2020    Medications: Prior to Admission medications   Medication Sig Start Date End Date Taking? Authorizing Provider  amLODipine (NORVASC) 10 MG tablet Take 10 mg by mouth daily.   Yes [provider]  aspirin 325 MG tablet Take 325 mg by mouth daily.   Yes [provider]  atorvastatin (LIPITOR) 80 MG tablet Take 40 mg by mouth at bedtime.    Yes [provider]  Cholecalciferol 50 MCG (2000 UT) TABS Take 4,000 Units by mouth daily.   Yes [provider]  clopidogrel (PLAVIX) 75 MG tablet Take 75 mg by mouth at bedtime.    Yes [provider]  tadalafil (CIALIS) 10 MG tablet Take 5 mg by mouth daily as needed for erectile dysfunction.   Yes [provider]    I have reviewed the patient's current medications.  Labs:  Results for orders placed or performed during the hospital encounter of 01/15/2020 (from the past 48 hour(s))  Basic metabolic panel     Status: Abnormal   Collection Time: 01/09/20  2:15 PM  Result Value Ref Range   Sodium 143 135 - 145 mmol/L   Potassium 4.9 3.5 - 5.1 mmol/L    Chloride 106 98 - 111 mmol/L   CO2 20 (L) 22 - 32 mmol/L   Glucose, Bld 138 (H) 70 - 99 mg/dL    Comment: Glucose reference range applies only to samples taken after fasting for at least 8 hours.   BUN 18 8 - 23 mg/dL   Creatinine, Ser 1.71 (H) 0.61 - 1.24 mg/dL   Calcium 8.3 (L) 8.9 - 10.3 mg/dL   GFR, Estimated 40 (L) >60 mL/min   Anion gap 17 (H) 5 - 15    Comment: Performed at [facility] [address], [place], [place] [address]  CBC     Status: Abnormal   Collection Time: 01/09/20  2:15 PM  Result Value Ref Range   WBC 15.7 (H) 4.0 -  10.5 K/uL   RBC 2.84 (L) 4.22 - 5.81 MIL/uL   Hemoglobin 8.4 (L) 13.0 - 17.0 g/dL   HCT 25.7 (L) 39 - 52 %   MCV 90.5 80.0 - 100.0 fL   MCH 29.6 26.0 - 34.0 pg   MCHC 32.7 30.0 - 36.0 g/dL   RDW 14.6 11.5 - 15.5 %   Platelets 101 (L) 150 - 400 K/uL    Comment: REPEATED TO VERIFY PLATELET COUNT CONFIRMED BY SMEAR Immature Platelet Fraction may be clinically indicated, consider ordering this additional test [id number]    nRBC 0.0 0.0 - 0.2 %    Comment: Performed at [facility], [place] [address], [place], [place] [address]  Glucose, capillary     Status: Abnormal   Collection Time: 01/09/20  3:04 PM  Result Value Ref Range   Glucose-Capillary 138 (H) 70 - 99 mg/dL    Comment: Glucose reference range applies only to samples taken after fasting for at least 8 hours.  I-STAT 7, (LYTES, BLD GAS, ICA, H+H)     Status: Abnormal   Collection Time: 01/09/20  3:10 PM  Result Value Ref Range   pH, Arterial 7.360 7.35 - 7.45   pCO2 arterial 43.6 32 - 48 mmHg   pO2, Arterial 215 (H) 83 - 108 mmHg   Bicarbonate 24.8 20.0 - 28.0 mmol/L   TCO2 26 22 - 32 mmol/L   O2 Saturation 100.0 %   Acid-base deficit 1.0 0.0 - 2.0 mmol/L   Sodium 142 135 - 145 mmol/L   Potassium 5.3 (H) 3.5 - 5.1 mmol/L   Calcium, Ion 1.11 (L) 1.15 - 1.40 mmol/L   HCT 23.0 (L) 39 - 52 %   Hemoglobin 7.8 (L) 13.0 - 17.0 g/dL   Patient temperature 36.4 C     Collection site Radial    Drawn by HIDE    Sample type ARTERIAL   Glucose, capillary     Status: Abnormal   Collection Time: 01/09/20  5:06 PM  Result Value Ref Range   Glucose-Capillary 147 (H) 70 - 99 mg/dL    Comment: Glucose reference range applies only to samples taken after fasting for at least 8 hours.  CBC     Status: Abnormal   Collection Time: 01/09/20  5:38 PM  Result Value Ref Range   WBC 16.5 (H) 4.0 - 10.5 K/uL   RBC 2.80 (L) 4.22 - 5.81 MIL/uL   Hemoglobin 8.2 (L) 13.0 - 17.0 g/dL   HCT 25.2 (L) 39 - 52 %   MCV 90.0 80.0 - 100.0 fL   MCH 29.3 26.0 - 34.0 pg   MCHC 32.5 30.0 - 36.0 g/dL   RDW 14.7 11.5 - 15.5 %   Platelets 106 (L) 150 - 400 K/uL    Comment: REPEATED TO VERIFY Immature Platelet Fraction may be clinically indicated, consider ordering this additional test [id number] CONSISTENT WITH PREVIOUS RESULT    nRBC 0.0 0.0 - 0.2 %    Comment: Performed at [facility], [place] [address] [place], [place] [address]  APTT     Status: Abnormal   Collection Time: 01/09/20  5:38 PM  Result Value Ref Range   aPTT 53 (H) 24 - 36 seconds    Comment:        IF BASELINE aPTT IS ELEVATED, SUGGEST PATIENT RISK ASSESSMENT BE USED TO DETERMINE APPROPRIATE ANTICOAGULANT THERAPY. Performed at [facility], [place] [address] [place], [place] [address]   .Cooxemetry Panel (carboxy,  met, total hgb, O2 sat)     Status: Abnormal   Collection Time: 01/09/20  5:38 PM  Result Value Ref Range   Total hemoglobin 8.1 (L) 12.0 - 16.0 g/dL   O2 Saturation 45.0 %   Carboxyhemoglobin 0.9 0.5 - 1.5 %   Methemoglobin 0.7 0.0 - 1.5 %    Comment: Performed at [facility] [address] [place], [place] [address]  Basic metabolic panel     Status: Abnormal   Collection Time: 01/09/20  5:38 PM  Result Value Ref Range   Sodium 143 135 - 145 mmol/L   Potassium 5.5 (H) 3.5 - 5.1 mmol/L   Chloride 106 98 - 111 mmol/L   CO2 19 (L) 22 - 32 mmol/L   Glucose, Bld 145 (H) 70  - 99 mg/dL    Comment: Glucose reference range applies only to samples taken after fasting for at least 8 hours.   BUN 22 8 - 23 mg/dL   Creatinine, Ser 1.82 (H) 0.61 - 1.24 mg/dL   Calcium 8.1 (L) 8.9 - 10.3 mg/dL   GFR, Estimated 37 (L) >60 mL/min   Anion gap 18 (H) 5 - 15    Comment: Performed at [facility] [address] [place], [place] [address]  Heparin level (unfractionated)     Status: Abnormal   Collection Time: 01/09/20  5:38 PM  Result Value Ref Range   Heparin Unfractionated <0.10 (L) 0.30 - 0.70 IU/mL    Comment: (NOTE) If heparin results are below expected values, and patient dosage has  been confirmed, suggest follow up testing of antithrombin III levels. Performed at [facility], [place] [address], [place], [place] [address]   Magnesium     Status: None   Collection Time: 01/09/20  5:38 PM  Result Value Ref Range   Magnesium 2.1 1.7 - 2.4 mg/dL    Comment: Performed at [facility], [place] [address], [place], [place] [address]  I-STAT 7, (LYTES, BLD GAS, ICA, H+H)     Status: Abnormal   Collection Time: 01/09/20  5:43 PM  Result Value Ref Range   pH, Arterial 7.324 (L) 7.35 - 7.45   pCO2 arterial 40.0 32 - 48 mmHg   pO2, Arterial 69 (L) 83 - 108 mmHg   Bicarbonate 20.8 20.0 - 28.0 mmol/L   TCO2 22 22 - 32 mmol/L   O2 Saturation 92.0 %   Acid-base deficit 5.0 (H) 0.0 - 2.0 mmol/L   Sodium 142 135 - 145 mmol/L   Potassium 5.4 (H) 3.5 - 5.1 mmol/L   Calcium, Ion 1.13 (L) 1.15 - 1.40 mmol/L   HCT 23.0 (L) 39 - 52 %   Hemoglobin 7.8 (L) 13.0 - 17.0 g/dL   Patient temperature 37.1 C    Collection site art line    Drawn by Operator    Sample type ARTERIAL   Prepare RBC (crossmatch)     Status: None   Collection Time: 01/09/20  6:35 PM  Result Value Ref Range   Order Confirmation      ORDER PROCESSED BY BLOOD BANK Performed at [facility], [address] [place], [place] [address]   Glucose, capillary     Status: Abnormal    Collection Time: 01/09/20  8:11 PM  Result Value Ref Range   Glucose-Capillary 135 (H) 70 - 99 mg/dL    Comment: Glucose reference range applies only to samples taken after fasting for at least 8  hours.  I-STAT 7, (LYTES, BLD GAS, ICA, H+H)     Status: Abnormal   Collection Time: 01/09/20  8:59 PM  Result Value Ref Range   pH, Arterial 7.306 (L) 7.35 - 7.45   pCO2 arterial 36.7 32 - 48 mmHg   pO2, Arterial 56 (L) 83 - 108 mmHg   Bicarbonate 18.3 (L) 20.0 - 28.0 mmol/L   TCO2 19 (L) 22 - 32 mmol/L   O2 Saturation 85.0 %   Acid-base deficit 7.0 (H) 0.0 - 2.0 mmol/L   Sodium 142 135 - 145 mmol/L   Potassium 5.4 (H) 3.5 - 5.1 mmol/L   Calcium, Ion 1.11 (L) 1.15 - 1.40 mmol/L   HCT 24.0 (L) 39 - 52 %   Hemoglobin 8.2 (L) 13.0 - 17.0 g/dL   Patient temperature 37.3 C    Sample type ARTERIAL   CBC     Status: Abnormal   Collection Time: 01/09/20 11:15 PM  Result Value Ref Range   WBC 17.0 (H) 4.0 - 10.5 K/uL   RBC 3.25 (L) 4.22 - 5.81 MIL/uL   Hemoglobin 9.4 (L) 13.0 - 17.0 g/dL   HCT 28.7 (L) 39 - 52 %   MCV 88.3 80.0 - 100.0 fL   MCH 28.9 26.0 - 34.0 pg   MCHC 32.8 30.0 - 36.0 g/dL   RDW 14.6 11.5 - 15.5 %   Platelets 100 (L) 150 - 400 K/uL    Comment: Immature Platelet Fraction may be clinically indicated, consider ordering this additional test [id number] CONSISTENT WITH PREVIOUS RESULT    nRBC 0.0 0.0 - 0.2 %    Comment: Performed at [facility], [place] [address] [place], [place] [address]  Glucose, capillary     Status: Abnormal   Collection Time: 01/09/20 11:21 PM  Result Value Ref Range   Glucose-Capillary 149 (H) 70 - 99 mg/dL    Comment: Glucose reference range applies only to samples taken after fasting for at least 8 hours.  I-STAT 7, (LYTES, BLD GAS, ICA, H+H)     Status: Abnormal   Collection Time: 01/09/20 11:23 PM  Result Value Ref Range   pH, Arterial 7.394 7.35 - 7.45   pCO2 arterial 40.5 32 - 48 mmHg   pO2, Arterial 78 (L) 83 - 108 mmHg   Bicarbonate  24.6 20.0 - 28.0 mmol/L   TCO2 26 22 - 32 mmol/L   O2 Saturation 95.0 %   Acid-Base Excess 0.0 0.0 - 2.0 mmol/L   Sodium 142 135 - 145 mmol/L   Potassium 5.2 (H) 3.5 - 5.1 mmol/L   Calcium, Ion 1.11 (L) 1.15 - 1.40 mmol/L   HCT 27.0 (L) 39 - 52 %   Hemoglobin 9.2 (L) 13.0 - 17.0 g/dL   Patient temperature 37.5 C    Sample type ARTERIAL   LDH     Status: Abnormal   Collection Time: 01/10/20  4:26 AM  Result Value Ref Range   LDH 1,696 (H) 98 - 192 U/L    Comment: Performed at [facility], [place] [address] [place], [place] [address]  APTT     Status: Abnormal   Collection Time: 01/10/20  4:26 AM  Result Value Ref Range   aPTT 44 (H) 24 - 36 seconds    Comment:        IF BASELINE aPTT IS ELEVATED, SUGGEST PATIENT RISK ASSESSMENT BE USED TO DETERMINE APPROPRIATE ANTICOAGULANT THERAPY. Performed at [facility], [place] [address], [place], [place]  [address]   .Cooxemetry Panel (carboxy, met, total hgb, O2 sat)     Status: Abnormal   Collection Time: 01/10/20  4:26 AM  Result Value Ref Range   Total hemoglobin 8.1 (L) 12.0 - 16.0 g/dL   O2 Saturation 48.1 %   Carboxyhemoglobin 1.1 0.5 - 1.5 %   Methemoglobin 0.9 0.0 - 1.5 %    Comment: Performed at [facility], [place] [address], [place], [place] [address]  Basic metabolic panel     Status: Abnormal   Collection Time: 01/10/20  4:26 AM  Result Value Ref Range   Sodium 142 135 - 145 mmol/L   Potassium 5.1 3.5 - 5.1 mmol/L   Chloride 107 98 - 111 mmol/L   CO2 23 22 - 32 mmol/L   Glucose, Bld 149 (H) 70 - 99 mg/dL    Comment: Glucose reference range applies only to samples taken after fasting for at least 8 hours.   BUN 29 (H) 8 - 23 mg/dL   Creatinine, Ser 2.17 (H) 0.61 - 1.24 mg/dL   Calcium 8.1 (L) 8.9 - 10.3 mg/dL   GFR, Estimated 30 (L) >60 mL/min   Anion gap 12 5 - 15    Comment: Performed at [facility] [address] [place], [place] [address]  CBC     Status: Abnormal   Collection Time:  01/10/20  4:26 AM  Result Value Ref Range   WBC 17.9 (H) 4.0 - 10.5 K/uL   RBC 3.05 (L) 4.22 - 5.81 MIL/uL   Hemoglobin 9.2 (L) 13.0 - 17.0 g/dL   HCT 27.2 (L) 39 - 52 %   MCV 89.2 80.0 - 100.0 fL   MCH 30.2 26.0 - 34.0 pg   MCHC 33.8 30.0 - 36.0 g/dL   RDW 14.8 11.5 - 15.5 %   Platelets 87 (L) 150 - 400 K/uL    Comment: REPEATED TO VERIFY Immature Platelet Fraction may be clinically indicated, consider ordering this additional test [id number]    nRBC 0.1 0.0 - 0.2 %    Comment: Performed at [facility], [place] [address] [place], [place] [address]  Heparin level (unfractionated)     Status: Abnormal   Collection Time: 01/10/20  4:26 AM  Result Value Ref Range   Heparin Unfractionated <0.10 (L) 0.30 - 0.70 IU/mL    Comment: (NOTE) If heparin results are below expected values, and patient dosage has  been confirmed, suggest follow up testing of antithrombin III levels. Performed at [facility], [place] [address], [place], [place] [address]   Procalcitonin - Baseline     Status: None   Collection Time: 01/10/20  4:26 AM  Result Value Ref Range   Procalcitonin 43.79 ng/mL    Comment:        Interpretation: PCT >= 10 ng/mL: Important systemic inflammatory response, almost exclusively due to severe bacterial sepsis or septic shock. (NOTE)       Sepsis PCT Algorithm           Lower Respiratory Tract                                      Infection PCT Algorithm    ----------------------------     ----------------------------         PCT < 0.25 ng/mL  PCT < 0.10 ng/mL          Strongly encourage             Strongly discourage   discontinuation of antibiotics    initiation of antibiotics    ----------------------------     -----------------------------       PCT 0.25 - 0.50 ng/mL            PCT 0.10 - 0.25 ng/mL               OR       >80% decrease in PCT            Discourage initiation of                                            antibiotics       Encourage discontinuation           of antibiotics    ----------------------------     -----------------------------         PCT >= 0.50 ng/mL              PCT 0.26 - 0.50 ng/mL                AND       <80% decrease in PCT             Encourage initiation of                                             antibiotics       Encourage continuation           of antibiotics    ----------------------------     -----------------------------        PCT >= 0.50 ng/mL                  PCT > 0.50 ng/mL               AND         increase in PCT                  Strongly encourage                                      initiation of antibiotics    Strongly encourage escalation           of antibiotics                                     -----------------------------                                           PCT <= 0.25 ng/mL                                                 OR                                        >  80% decrease in PCT                                      Discontinue / Do not initiate                                             antibiotics  Performed at [facility], [place] [address] [place], [place] [address]   Glucose, capillary     Status: Abnormal   Collection Time: 01/10/20  4:44 AM  Result Value Ref Range   Glucose-Capillary 137 (H) 70 - 99 mg/dL    Comment: Glucose reference range applies only to samples taken after fasting for at least 8 hours.  I-STAT 7, (LYTES, BLD GAS, ICA, H+H)     Status: Abnormal   Collection Time: 01/10/20  4:46 AM  Result Value Ref Range   pH, Arterial 7.364 7.35 - 7.45   pCO2 arterial 41.9 32 - 48 mmHg   pO2, Arterial 64 (L) 83 - 108 mmHg   Bicarbonate 23.8 20.0 - 28.0 mmol/L   TCO2 25 22 - 32 mmol/L   O2 Saturation 91.0 %   Acid-base deficit 1.0 0.0 - 2.0 mmol/L   Sodium 142 135 - 145 mmol/L   Potassium 5.1 3.5 - 5.1 mmol/L   Calcium, Ion 1.14 (L) 1.15 - 1.40 mmol/L   HCT 25.0 (L) 39 - 52 %   Hemoglobin 8.5 (L) 13.0 - 17.0 g/dL    Patient temperature 37.4 C    Sample type ARTERIAL   I-STAT 7, (LYTES, BLD GAS, ICA, H+H)     Status: Abnormal   Collection Time: 01/10/20  7:46 AM  Result Value Ref Range   pH, Arterial 7.302 (L) 7.35 - 7.45   pCO2 arterial 42.6 32 - 48 mmHg   pO2, Arterial 57 (L) 83 - 108 mmHg   Bicarbonate 21.1 20.0 - 28.0 mmol/L   TCO2 22 22 - 32 mmol/L   O2 Saturation 86.0 %   Acid-base deficit 5.0 (H) 0.0 - 2.0 mmol/L   Sodium 143 135 - 145 mmol/L   Potassium 4.9 3.5 - 5.1 mmol/L   Calcium, Ion 1.08 (L) 1.15 - 1.40 mmol/L   HCT 25.0 (L) 39 - 52 %   Hemoglobin 8.5 (L) 13.0 - 17.0 g/dL   Patient temperature 37.0 C    Sample type ARTERIAL   Glucose, capillary     Status: Abnormal   Collection Time: 01/10/20  7:47 AM  Result Value Ref Range   Glucose-Capillary 125 (H) 70 - 99 mg/dL    Comment: Glucose reference range applies only to samples taken after fasting for at least 8 hours.  Lactic acid, plasma     Status: Abnormal   Collection Time: 01/10/20  7:54 AM  Result Value Ref Range   Lactic Acid, Venous 8.6 (HH) 0.5 - 1.9 mmol/L    Comment: CRITICAL RESULT CALLED TO, READ BACK BY AND VERIFIED WITH: QUICK,K RN @ [phone] 01/10/20 LEONARD,A Performed at [facility], [place] [address] [place], [place] [address]   .Cooxemetry Panel (carboxy, met, total hgb, O2 sat)     Status: Abnormal   Collection Time: 01/10/20  7:54 AM  Result Value Ref Range   Total hemoglobin 8.8 (L) 12.0 - 16.0 g/dL  O2 Saturation 39.4 %   Carboxyhemoglobin 0.9 0.5 - 1.5 %   Methemoglobin 0.9 0.0 - 1.5 %    Comment: Performed at [facility] [address], [place], [place] [address]  Prepare RBC (crossmatch)     Status: None   Collection Time: 01/10/20  9:13 AM  Result Value Ref Range   Order Confirmation      ORDER PROCESSED BY BLOOD BANK Performed at [facility], [place] [address] [place], [place] [address]   Prepare RBC (crossmatch)     Status: None   Collection Time: 01/10/20  9:33 AM  Result  Value Ref Range   Order Confirmation      ORDER PROCESSED BY BLOOD BANK BB SAMPLE OR UNITS ALREADY AVAILABLE Performed at [facility], [place] [address], [place], [place] [address]   I-STAT 7, (LYTES, BLD GAS, ICA, H+H)     Status: Abnormal   Collection Time: 01/10/20 11:21 AM  Result Value Ref Range   pH, Arterial 7.219 (L) 7.35 - 7.45   pCO2 arterial 41.8 32 - 48 mmHg   pO2, Arterial 66 (L) 83 - 108 mmHg   Bicarbonate 17.1 (L) 20.0 - 28.0 mmol/L   TCO2 18 (L) 22 - 32 mmol/L   O2 Saturation 89.0 %   Acid-base deficit 10.0 (H) 0.0 - 2.0 mmol/L   Sodium 142 135 - 145 mmol/L   Potassium 4.6 3.5 - 5.1 mmol/L   Calcium, Ion 1.07 (L) 1.15 - 1.40 mmol/L   HCT 23.0 (L) 39 - 52 %   Hemoglobin 7.8 (L) 13.0 - 17.0 g/dL   Patient temperature 37.0 C    Sample type ARTERIAL   Glucose, capillary     Status: Abnormal   Collection Time: 01/10/20 11:47 AM  Result Value Ref Range   Glucose-Capillary 137 (H) 70 - 99 mg/dL    Comment: Glucose reference range applies only to samples taken after fasting for at least 8 hours.  .Cooxemetry Panel (carboxy, met, total hgb, O2 sat)     Status: Abnormal   Collection Time: 01/10/20  4:22 PM  Result Value Ref Range   Total hemoglobin 8.8 (L) 12.0 - 16.0 g/dL   O2 Saturation 39.7 %   Carboxyhemoglobin 0.8 0.5 - 1.5 %   Methemoglobin 0.9 0.0 - 1.5 %    Comment: Performed at [facility] [address] [place], [place] [address]  Heparin level (unfractionated)     Status: Abnormal   Collection Time: 01/10/20  4:22 PM  Result Value Ref Range   Heparin Unfractionated <0.10 (L) 0.30 - 0.70 IU/mL    Comment: REPEATED TO VERIFY (NOTE) If heparin results are below expected values, and patient dosage has  been confirmed, suggest follow up testing of antithrombin III levels. Performed at [facility], [place] [address] [place], [place] [address]   Lactic acid, plasma     Status: Abnormal   Collection Time: 01/10/20  4:25 PM  Result Value Ref  Range   Lactic Acid, Venous 8.5 (HH) 0.5 - 1.9 mmol/L    Comment: CRITICAL RESULT CALLED TO, READ BACK BY AND VERIFIED WITH: K.QUICK,RN @[phone]  01/10/2020 [email or web address] Performed at [facility], [place] [address], [place], [place] [address]   Glucose, capillary     Status: Abnormal   Collection Time: 01/10/20  4:33 PM  Result Value Ref Range   Glucose-Capillary 133 (H) 70 - 99 mg/dL    Comment: Glucose reference range applies  only to samples taken after fasting for at least 8 hours.  APTT     Status: Abnormal   Collection Time: 01/10/20  4:44 PM  Result Value Ref Range   aPTT 46 (H) 24 - 36 seconds    Comment:        IF BASELINE aPTT IS ELEVATED, SUGGEST PATIENT RISK ASSESSMENT BE USED TO DETERMINE APPROPRIATE ANTICOAGULANT THERAPY. Performed at [facility], [place] [address] [place], [place] [address]   CBC     Status: Abnormal   Collection Time: 01/10/20  4:44 PM  Result Value Ref Range   WBC 17.9 (H) 4.0 - 10.5 K/uL   RBC 2.88 (L) 4.22 - 5.81 MIL/uL   Hemoglobin 8.8 (L) 13.0 - 17.0 g/dL   HCT 26.7 (L) 39 - 52 %   MCV 92.7 80.0 - 100.0 fL   MCH 30.6 26.0 - 34.0 pg   MCHC 33.0 30.0 - 36.0 g/dL   RDW 15.1 11.5 - 15.5 %   Platelets 60 (L) 150 - 400 K/uL    Comment: REPEATED TO VERIFY SPECIMEN CHECKED FOR CLOTS Immature Platelet Fraction may be clinically indicated, consider ordering this additional test [id number] CONSISTENT WITH PREVIOUS RESULT    nRBC 0.2 0.0 - 0.2 %    Comment: Performed at [facility], [place] [address], [place], [place] [address]  Comprehensive metabolic panel     Status: Abnormal   Collection Time: 01/10/20  4:44 PM  Result Value Ref Range   Sodium 140 135 - 145 mmol/L   Potassium 4.8 3.5 - 5.1 mmol/L   Chloride 106 98 - 111 mmol/L   CO2 18 (L) 22 - 32 mmol/L   Glucose, Bld 133 (H) 70 - 99 mg/dL    Comment: Glucose reference range applies only to samples taken after fasting for at least 8 hours.   BUN 40 (H) 8 - 23 mg/dL   Creatinine, Ser  2.54 (H) 0.61 - 1.24 mg/dL   Calcium 7.7 (L) 8.9 - 10.3 mg/dL   Total Protein 4.2 (L) 6.5 - 8.1 g/dL   Albumin 2.5 (L) 3.5 - 5.0 g/dL   AST 3,030 (H) 15 - 41 U/L    Comment: RESULTS CONFIRMED BY MANUAL DILUTION   ALT 2,101 (H) 0 - 44 U/L   Alkaline Phosphatase 53 38 - 126 U/L   Total Bilirubin 0.8 0.3 - 1.2 mg/dL   GFR, Estimated 25 (L) >60 mL/min   Anion gap 16 (H) 5 - 15    Comment: Performed at [facility], [place] [address] [place], [place] [address]  Phosphorus     Status: Abnormal   Collection Time: 01/10/20  4:44 PM  Result Value Ref Range   Phosphorus 6.1 (H) 2.5 - 4.6 mg/dL    Comment: Performed at [facility] [address] [place], [place] [address]  Glucose, capillary     Status: Abnormal   Collection Time: 01/10/20  8:55 PM  Result Value Ref Range   Glucose-Capillary 109 (H) 70 - 99 mg/dL    Comment: Glucose reference range applies only to samples taken after fasting for at least 8 hours.  .Cooxemetry Panel (carboxy, met, total hgb, O2 sat)     Status: Abnormal   Collection Time: 01/10/20  9:31 PM  Result Value Ref Range   Total hemoglobin 8.2 (L) 12.0 - 16.0 g/dL   O2 Saturation 50.4 %   Carboxyhemoglobin 1.0 0.5 - 1.5 %   Methemoglobin 0.9 0.0 -  1.5 %    Comment: Performed at [facility], [place] [address], [place], [place] [address]  I-STAT 7, (LYTES, BLD GAS, ICA, H+H)     Status: Abnormal   Collection Time: 01/10/20 10:38 PM  Result Value Ref Range   pH, Arterial 7.356 7.35 - 7.45   pCO2 arterial 37.4 32 - 48 mmHg   pO2, Arterial 64 (L) 83 - 108 mmHg   Bicarbonate 20.9 20.0 - 28.0 mmol/L   TCO2 22 22 - 32 mmol/L   O2 Saturation 91.0 %   Acid-base deficit 4.0 (H) 0.0 - 2.0 mmol/L   Sodium 143 135 - 145 mmol/L   Potassium 4.4 3.5 - 5.1 mmol/L   Calcium, Ion 1.03 (L) 1.15 - 1.40 mmol/L   HCT 21.0 (L) 39 - 52 %   Hemoglobin 7.1 (L) 13.0 - 17.0 g/dL   Patient temperature 37.5 C    Sample type ARTERIAL   Glucose, capillary     Status: None    Collection Time: 01/12/2020 12:23 AM  Result Value Ref Range   Glucose-Capillary 98 70 - 99 mg/dL    Comment: Glucose reference range applies only to samples taken after fasting for at least 8 hours.  APTT     Status: Abnormal   Collection Time: 12/29/2019  2:01 AM  Result Value Ref Range   aPTT 75 (H) 24 - 36 seconds    Comment:        IF BASELINE aPTT IS ELEVATED, SUGGEST PATIENT RISK ASSESSMENT BE USED TO DETERMINE APPROPRIATE ANTICOAGULANT THERAPY. Performed at [facility], [place] [address], [place], [place] [address]   LDH     Status: Abnormal   Collection Time: 12/26/2019  3:58 AM  Result Value Ref Range   LDH 4,234 (H) 98 - 192 U/L    Comment: RESULTS CONFIRMED BY MANUAL DILUTION Performed at [facility], [place] [address], [place], [place] [address]   .Cooxemetry Panel (carboxy, met, total hgb, O2 sat)     Status: Abnormal   Collection Time: 01/06/2020  3:58 AM  Result Value Ref Range   Total hemoglobin 7.2 (L) 12.0 - 16.0 g/dL   O2 Saturation 59.1 %   Carboxyhemoglobin 0.6 0.5 - 1.5 %   Methemoglobin 0.9 0.0 - 1.5 %    Comment: Performed at [facility] [address], [place], [place] [address]  CBC     Status: Abnormal   Collection Time: 12/24/2019  3:58 AM  Result Value Ref Range   WBC 18.8 (H) 4.0 - 10.5 K/uL   RBC 2.54 (L) 4.22 - 5.81 MIL/uL   Hemoglobin 7.8 (L) 13.0 - 17.0 g/dL   HCT 23.8 (L) 39 - 52 %   MCV 93.7 80.0 - 100.0 fL   MCH 30.7 26.0 - 34.0 pg   MCHC 32.8 30.0 - 36.0 g/dL   RDW 15.2 11.5 - 15.5 %   Platelets 33 (L) 150 - 400 K/uL    Comment: REPEATED TO VERIFY Immature Platelet Fraction may be clinically indicated, consider ordering this additional test [id number] CONSISTENT WITH PREVIOUS RESULT    nRBC 0.4 (H) 0.0 - 0.2 %    Comment: Performed at [facility], [place] [address], [place],  [address]  Comprehensive metabolic panel     Status: Abnormal   Collection Time: 12/31/2019  3:58 AM  Result Value Ref Range   Sodium  143 135 - 145 mmol/L   Potassium 5.2 (H) 3.5 - 5.1  mmol/L   Chloride 108 98 - 111 mmol/L   CO2 17 (L) 22 - 32 mmol/L   Glucose, Bld 68 (L) 70 - 99 mg/dL    Comment: Glucose reference range applies only to samples taken after fasting for at least 8 hours.   BUN 49 (H) 8 - 23 mg/dL   Creatinine, Ser 2.83 (H) 0.61 - 1.24 mg/dL   Calcium 7.4 (L) 8.9 - 10.3 mg/dL   Total Protein 4.0 (L) 6.5 - 8.1 g/dL   Albumin 2.4 (L) 3.5 - 5.0 g/dL   AST 5,615 (H) 15 - 41 U/L    Comment: RESULTS CONFIRMED BY MANUAL DILUTION   ALT 3,466 (H) 0 - 44 U/L    Comment: RESULTS CONFIRMED BY MANUAL DILUTION   Alkaline Phosphatase 66 38 - 126 U/L   Total Bilirubin 2.7 (H) 0.3 - 1.2 mg/dL   GFR, Estimated 22 (L) >60 mL/min   Anion gap 18 (H) 5 - 15    Comment: Performed at [facility], [place] [address], [place], [place] [address]  Glucose, capillary     Status: Abnormal   Collection Time: 12/22/2019  4:05 AM  Result Value Ref Range   Glucose-Capillary 65 (L) 70 - 99 mg/dL    Comment: Glucose reference range applies only to samples taken after fasting for at least 8 hours.  I-STAT 7, (LYTES, BLD GAS, ICA, H+H)     Status: Abnormal   Collection Time: 12/23/2019  4:07 AM  Result Value Ref Range   pH, Arterial 7.302 (L) 7.35 - 7.45   pCO2 arterial 40.3 32 - 48 mmHg   pO2, Arterial 70 (L) 83 - 108 mmHg   Bicarbonate 20.0 20.0 - 28.0 mmol/L   TCO2 21 (L) 22 - 32 mmol/L   O2 Saturation 92.0 %   Acid-base deficit 6.0 (H) 0.0 - 2.0 mmol/L   Sodium 142 135 - 145 mmol/L   Potassium 5.1 3.5 - 5.1 mmol/L   Calcium, Ion 1.02 (L) 1.15 - 1.40 mmol/L   HCT 21.0 (L) 39 - 52 %   Hemoglobin 7.1 (L) 13.0 - 17.0 g/dL   Patient temperature 36.8 C    Sample type ARTERIAL   Lactic acid, plasma     Status: Abnormal   Collection Time: 01/09/2020  4:10 AM  Result Value Ref Range   Lactic Acid, Venous 10.1 (HH) 0.5 - 1.9 mmol/L    Comment: CRITICAL VALUE NOTED.  VALUE IS CONSISTENT WITH PREVIOUSLY REPORTED AND CALLED  VALUE. Performed at [facility], [place] [address], [place], [place] [address]   Glucose, capillary     Status: Abnormal   Collection Time: 12/30/2019  4:43 AM  Result Value Ref Range   Glucose-Capillary 137 (H) 70 - 99 mg/dL    Comment: Glucose reference range applies only to samples taken after fasting for at least 8 hours.  Prepare RBC (crossmatch)     Status: None   Collection Time: 12/27/2019  5:13 AM  Result Value Ref Range   Order Confirmation      ORDER PROCESSED BY BLOOD BANK Performed at [facility], [address] [place], [place] [address]   Type and screen [place]     Status: None (Preliminary result)   Collection Time: 01/21/2020  5:29 AM  Result Value Ref Range   ABO/RH(D) O POS    Antibody Screen NEG    Sample Expiration 01/31/20,2359    Unit Number [id number]  Blood Component Type RED CELLS,LR    Unit division 00    Status of Unit ISSUED    Transfusion Status OK TO TRANSFUSE    Crossmatch Result Compatible    Unit Number [id number]    Blood Component Type RED CELLS,LR    Unit division 00    Status of Unit ISSUED    Transfusion Status OK TO TRANSFUSE    Crossmatch Result Compatible    Unit Number [id number]    Blood Component Type RED CELLS,LR    Unit division 00    Status of Unit ISSUED    Transfusion Status OK TO TRANSFUSE    Crossmatch Result Compatible    Unit Number [id number]    Blood Component Type RED CELLS,LR    Unit division 00    Status of Unit ISSUED    Transfusion Status OK TO TRANSFUSE    Crossmatch Result Compatible    Unit Number [id number]    Blood Component Type RED CELLS,LR    Unit division 00    Status of Unit ISSUED    Transfusion Status OK TO TRANSFUSE    Crossmatch Result Compatible    Unit Number [id number]    Blood Component Type RED CELLS,LR    Unit division 00    Status of Unit ALLOCATED    Transfusion Status OK TO TRANSFUSE    Crossmatch Result       Compatible Performed at [facility], [place] [address] [place], [place] [address]    Unit Number [id number]    Blood Component Type RED CELLS,LR    Unit division 00    Status of Unit ALLOCATED    Transfusion Status OK TO TRANSFUSE    Crossmatch Result Compatible    Unit Number [id number]    Blood Component Type RED CELLS,LR    Unit division 00    Status of Unit ALLOCATED    Transfusion Status OK TO TRANSFUSE    Crossmatch Result Compatible    Unit Number [id number]    Blood Component Type RED CELLS,LR    Unit division 00    Status of Unit ALLOCATED    Transfusion Status OK TO TRANSFUSE    Crossmatch Result Compatible    Unit Number [id number]    Blood Component Type RED CELLS,LR    Unit division 00    Status of Unit ALLOCATED    Transfusion Status OK TO TRANSFUSE    Crossmatch Result Compatible    Unit Number [id number]    Blood Component Type RED CELLS,LR    Unit division 00    Status of Unit ALLOCATED    Transfusion Status OK TO TRANSFUSE    Crossmatch Result Compatible   I-STAT 7, (LYTES, BLD GAS, ICA, H+H)     Status: Abnormal   Collection Time: 01/04/2020  5:46 AM  Result Value Ref Range   pH, Arterial 7.222 (L) 7.35 - 7.45   pCO2 arterial 49.5 (H) 32 - 48 mmHg   pO2, Arterial 70 (L) 83 - 108 mmHg   Bicarbonate 20.4 20.0 - 28.0 mmol/L   TCO2 22 22 - 32 mmol/L   O2 Saturation 90.0 %   Acid-base deficit 7.0 (H) 0.0 - 2.0 mmol/L   Sodium 145 135 - 145 mmol/L   Potassium 5.0 3.5 - 5.1 mmol/L   Calcium, Ion 0.92 (L) 1.15 - 1.40 mmol/L   HCT 16.0 (L) 39 - 52 %   Hemoglobin 5.4 (LL) 13.0 - 17.0 g/dL   Patient temperature 36.9 C  Sample type ARTERIAL    Comment NOTIFIED PHYSICIAN   Glucose, capillary     Status: Abnormal   Collection Time: 01/19/2020  5:56 AM  Result Value Ref Range   Glucose-Capillary 105 (H) 70 - 99 mg/dL    Comment: Glucose reference range applies only to samples taken after fasting for at least 8 hours.  Procalcitonin -  Baseline     Status: None   Collection Time: 01/12/2020  6:42 AM  Result Value Ref Range   Procalcitonin 29.26 ng/mL    Comment:        Interpretation: PCT >= 10 ng/mL: Important systemic inflammatory response, almost exclusively due to severe bacterial sepsis or septic shock. (NOTE)       Sepsis PCT Algorithm           Lower Respiratory Tract                                      Infection PCT Algorithm    ----------------------------     ----------------------------         PCT < 0.25 ng/mL                PCT < 0.10 ng/mL          Strongly encourage             Strongly discourage   discontinuation of antibiotics    initiation of antibiotics    ----------------------------     -----------------------------       PCT 0.25 - 0.50 ng/mL            PCT 0.10 - 0.25 ng/mL               OR       >80% decrease in PCT            Discourage initiation of                                            antibiotics      Encourage discontinuation           of antibiotics    ----------------------------     -----------------------------         PCT >= 0.50 ng/mL              PCT 0.26 - 0.50 ng/mL                AND       <80% decrease in PCT             Encourage initiation of                                             antibiotics       Encourage continuation           of antibiotics    ----------------------------     -----------------------------        PCT >= 0.50 ng/mL                  PCT > 0.50 ng/mL               AND  increase in PCT                  Strongly encourage                                      initiation of antibiotics    Strongly encourage escalation           of antibiotics                                     -----------------------------                                           PCT <= 0.25 ng/mL                                                 OR                                        > 80% decrease in PCT                                      Discontinue / Do not  initiate                                             antibiotics  Performed at [facility], [address], [place], [place] [address]   I-STAT 7, (LYTES, BLD GAS, ICA, H+H)     Status: Abnormal   Collection Time: 12/29/2019  7:18 AM  Result Value Ref Range   pH, Arterial 7.283 (L) 7.35 - 7.45   pCO2 arterial 43.9 32 - 48 mmHg   pO2, Arterial 134 (H) 83 - 108 mmHg   Bicarbonate 20.7 20.0 - 28.0 mmol/L   TCO2 22 22 - 32 mmol/L   O2 Saturation 99.0 %   Acid-base deficit 5.0 (H) 0.0 - 2.0 mmol/L   Sodium 143 135 - 145 mmol/L   Potassium 6.3 (HH) 3.5 - 5.1 mmol/L   Calcium, Ion 0.89 (LL) 1.15 - 1.40 mmol/L   HCT 19.0 (L) 39 - 52 %   Hemoglobin 6.5 (LL) 13.0 - 17.0 g/dL   Patient temperature 37.3 C    Sample type ARTERIAL   Prepare Pheresed Platelets     Status: None (Preliminary result)   Collection Time: 01/19/2020  7:37 AM  Result Value Ref Range   Unit Number [id number]    Blood Component Type PLTP2 PSORALEN TREATED    Unit division 00    Status of Unit ISSUED    Transfusion Status      OK TO TRANSFUSE Performed at [facility] [address] [place], [place] [address]    Unit Number [id number]    Blood Component Type PLTP1 PSORALEN TREATED  Unit division 00    Status of Unit ISSUED    Transfusion Status OK TO TRANSFUSE   Cortisol     Status: None   Collection Time: 01/21/2020  7:40 AM  Result Value Ref Range   Cortisol, Plasma 98.7 ug/dL    Comment: RESULTS CONFIRMED BY MANUAL DILUTION (NOTE) AM    6.7 - 22.6 ug/dL PM   <10.0       ug/dL Performed at [facility] [address] [place], [place] [address]   Prepare RBC (crossmatch)     Status: None   Collection Time: 01/15/2020  8:02 AM  Result Value Ref Range   Order Confirmation      ORDER PROCESSED BY BLOOD BANK Performed at [facility], [place] [address] [place], [place] [address]   I-STAT 7, (LYTES, BLD GAS, ICA, H+H)     Status: Abnormal   Collection Time: 01/06/2020  8:31 AM   Result Value Ref Range   pH, Arterial 7.179 (LL) 7.35 - 7.45   pCO2 arterial 51.6 (H) 32 - 48 mmHg   pO2, Arterial 154 (H) 83 - 108 mmHg   Bicarbonate 19.2 (L) 20.0 - 28.0 mmol/L   TCO2 21 (L) 22 - 32 mmol/L   O2 Saturation 99.0 %   Acid-base deficit 9.0 (H) 0.0 - 2.0 mmol/L   Sodium 142 135 - 145 mmol/L   Potassium 6.1 (H) 3.5 - 5.1 mmol/L   Calcium, Ion 0.80 (LL) 1.15 - 1.40 mmol/L   HCT 24.0 (L) 39 - 52 %   Hemoglobin 8.2 (L) 13.0 - 17.0 g/dL   Sample type ARTERIAL   I-STAT 7, (LYTES, BLD GAS, ICA, H+H)     Status: Abnormal   Collection Time: 12/22/2019  9:28 AM  Result Value Ref Range   pH, Arterial 7.197 (LL) 7.35 - 7.45   pCO2 arterial 53.4 (H) 32 - 48 mmHg   pO2, Arterial 410 (H) 83 - 108 mmHg   Bicarbonate 20.8 20.0 - 28.0 mmol/L   TCO2 22 22 - 32 mmol/L   O2 Saturation 100.0 %   Acid-base deficit 7.0 (H) 0.0 - 2.0 mmol/L   Sodium 142 135 - 145 mmol/L   Potassium 5.9 (H) 3.5 - 5.1 mmol/L   Calcium, Ion 0.74 (LL) 1.15 - 1.40 mmol/L   HCT 20.0 (L) 39 - 52 %   Hemoglobin 6.8 (LL) 13.0 - 17.0 g/dL   Sample type ARTERIAL   I-STAT, chem 8     Status: Abnormal   Collection Time: 12/24/2019  9:32 AM  Result Value Ref Range   Sodium 142 135 - 145 mmol/L   Potassium 5.8 (H) 3.5 - 5.1 mmol/L   Chloride 105 98 - 111 mmol/L   BUN 51 (H) 8 - 23 mg/dL   Creatinine, Ser 3.40 (H) 0.61 - 1.24 mg/dL   Glucose, Bld 68 (L) 70 - 99 mg/dL    Comment: Glucose reference range applies only to samples taken after fasting for at least 8 hours.   Calcium, Ion 0.75 (LL) 1.15 - 1.40 mmol/L   TCO2 20 (L) 22 - 32 mmol/L   Hemoglobin 7.1 (L) 13.0 - 17.0 g/dL   HCT 21.0 (L) 39 - 52 %  Prepare RBC (crossmatch)     Status: None   Collection Time: 01/04/2020  9:43 AM  Result Value Ref Range   Order Confirmation      ORDER PROCESSED BY BLOOD BANK Performed at [facility], [place]  [address], [place], [place] [address]   I-STAT 7, (LYTES, BLD GAS, ICA, H+H)     Status: Abnormal   Collection Time:  01/13/2020 10:35 AM  Result Value Ref Range   pH, Arterial 7.205 (L) 7.35 - 7.45   pCO2 arterial 50.5 (H) 32 - 48 mmHg   pO2, Arterial 335 (H) 83 - 108 mmHg   Bicarbonate 20.0 20.0 - 28.0 mmol/L   TCO2 21 (L) 22 - 32 mmol/L   O2 Saturation 100.0 %   Acid-base deficit 7.0 (H) 0.0 - 2.0 mmol/L   Sodium 142 135 - 145 mmol/L   Potassium 5.6 (H) 3.5 - 5.1 mmol/L   Calcium, Ion 0.85 (LL) 1.15 - 1.40 mmol/L   HCT 19.0 (L) 39 - 52 %   Hemoglobin 6.5 (LL) 13.0 - 17.0 g/dL   Sample type ARTERIAL   CBC     Status: Abnormal   Collection Time: 01/20/2020 12:21 PM  Result Value Ref Range   WBC 11.5 (H) 4.0 - 10.5 K/uL   RBC 3.62 (L) 4.22 - 5.81 MIL/uL   Hemoglobin 10.8 (L) 13.0 - 17.0 g/dL    Comment: REPEATED TO VERIFY POST TRANSFUSION SPECIMEN    HCT 33.7 (L) 39 - 52 %   MCV 93.1 80.0 - 100.0 fL   MCH 29.8 26.0 - 34.0 pg   MCHC 32.0 30.0 - 36.0 g/dL   RDW 14.9 11.5 - 15.5 %   Platelets 47 (L) 150 - 400 K/uL    Comment: REPEATED TO VERIFY Immature Platelet Fraction may be clinically indicated, consider ordering this additional test [id number] CONSISTENT WITH PREVIOUS RESULT    nRBC 2.4 (H) 0.0 - 0.2 %    Comment: Performed at [facility], [place] [address], [place], [place] [address]  APTT     Status: Abnormal   Collection Time: 01/16/2020 12:21 PM  Result Value Ref Range   aPTT 68 (H) 24 - 36 seconds    Comment:        IF BASELINE aPTT IS ELEVATED, SUGGEST PATIENT RISK ASSESSMENT BE USED TO DETERMINE APPROPRIATE ANTICOAGULANT THERAPY. Performed at [facility], [place] [address], [place], [place] [address]   Protime-INR     Status: Abnormal   Collection Time: 12/27/2019 12:21 PM  Result Value Ref Range   Prothrombin Time 38.3 (H) 11.4 - 15.2 seconds   INR 4.1 (HH) 0.8 - 1.2    Comment: RESULT REPEATED AND VERIFIED CRITICAL RESULT CALLED TO, READ BACK BY AND VERIFIED WITH: [facility] 1336 01/16/2020 CLARK,S Performed at [facility], [place] [address], [place], [place]  [address]   Comprehensive metabolic panel     Status: Abnormal (Preliminary result)   Collection Time: 01/21/2020 12:21 PM  Result Value Ref Range   Sodium 141 135 - 145 mmol/L   Potassium 7.3 (HH) 3.5 - 5.1 mmol/L    Comment: ICTERIC SPECIMEN CRITICAL RESULT CALLED TO, READ BACK BY AND VERIFIED WITH: K BRADY RN 1349 [id number] BY A BENNETT    Chloride 105 98 - 111 mmol/L   CO2 18 (L) 22 - 32 mmol/L   Glucose, Bld 114 (H) 70 - 99 mg/dL    Comment: Glucose reference range applies only to samples taken after fasting for at least 8 hours.   BUN 51 (H) 8 - 23 mg/dL   Creatinine, Ser 3.03 (H) 0.61 - 1.24 mg/dL   Calcium 6.0 (LL) 8.9 - 10.3 mg/dL    Comment: CRITICAL RESULT CALLED TO, READ BACK BY  AND VERIFIED WITH: K BRADY RN 1349 [id number] BY A BENNETT    Total Protein 3.3 (L) 6.5 - 8.1 g/dL   Albumin 2.0 (L) 3.5 - 5.0 g/dL   AST PENDING 15 - 41 U/L   ALT PENDING 0 - 44 U/L   Alkaline Phosphatase 121 38 - 126 U/L   Total Bilirubin 3.2 (H) 0.3 - 1.2 mg/dL    Comment: ICTERIC SPECIMEN   GFR, Estimated 22 (L) >60 mL/min    Comment: (NOTE) Calculated using the CKD-EPI Creatinine Equation (2021)    Anion gap 18 (H) 5 - 15    Comment: Performed at [facility] [address] [place], [place] [address]  I-STAT 7, (LYTES, BLD GAS, ICA, H+H)     Status: Abnormal   Collection Time: 01/20/2020 12:40 PM  Result Value Ref Range   pH, Arterial 7.313 (L) 7.35 - 7.45   pCO2 arterial 40.4 32 - 48 mmHg   pO2, Arterial 227 (H) 83 - 108 mmHg   Bicarbonate 20.6 20.0 - 28.0 mmol/L   TCO2 22 22 - 32 mmol/L   O2 Saturation 100.0 %   Acid-base deficit 5.0 (H) 0.0 - 2.0 mmol/L   Sodium 139 135 - 145 mmol/L   Potassium 7.9 (HH) 3.5 - 5.1 mmol/L   Calcium, Ion 0.80 (LL) 1.15 - 1.40 mmol/L   HCT 29.0 (L) 39 - 52 %   Hemoglobin 9.9 (L) 13.0 - 17.0 g/dL   Patient temperature 36.2 C    Sample type ARTERIAL   I-STAT, chem 8     Status: Abnormal   Collection Time: 01/03/2020 12:50 PM  Result Value Ref Range    Sodium 138 135 - 145 mmol/L   Potassium 7.5 (HH) 3.5 - 5.1 mmol/L   Chloride 101 98 - 111 mmol/L   BUN 54 (H) 8 - 23 mg/dL   Creatinine, Ser 3.80 (H) 0.61 - 1.24 mg/dL   Glucose, Bld 114 (H) 70 - 99 mg/dL    Comment: Glucose reference range applies only to samples taken after fasting for at least 8 hours.   Calcium, Ion 0.80 (LL) 1.15 - 1.40 mmol/L   TCO2 20 (L) 22 - 32 mmol/L   Hemoglobin 9.5 (L) 13.0 - 17.0 g/dL   HCT 28.0 (L) 39 - 52 %  Prepare fresh frozen plasma     Status: None (Preliminary result)   Collection Time: 12/31/2019  1:46 PM  Result Value Ref Range   Unit Number [id number]    Blood Component Type THW PLS APHR    Unit division B0    Status of Unit ISSUED    Transfusion Status      OK TO TRANSFUSE Performed at [facility] [address] [place], [place] [address]    Unit Number [id number]    Blood Component Type THW PLS APHR    Unit division 00    Status of Unit ISSUED    Transfusion Status OK TO TRANSFUSE   Prepare cryoprecipitate     Status: None (Preliminary result)   Collection Time: 12/27/2019  1:47 PM  Result Value Ref Range   Unit Number [id number]    Blood Component Type CRYPOOL THAW    Unit division 00    Status of Unit ALLOCATED    Transfusion Status      OK TO TRANSFUSE Performed at [facility], [place] [address], [place], [place] [address]    Unit Number [id number]    Blood Component Type  CRYPOOL THAW    Unit division 00    Status of Unit ALLOCATED    Transfusion Status OK TO TRANSFUSE   Prepare Pheresed Platelets     Status: None (Preliminary result)   Collection Time: 12/29/2019  1:47 PM  Result Value Ref Range   Unit Number [id number]    Blood Component Type PLTP2 PSORALEN TREATED    Unit division 00    Status of Unit ISSUED    Transfusion Status OK TO TRANSFUSE    Unit Number [id number]    Blood Component Type PLTP1 PSORALEN TREATED    Unit division 00    Status of Unit ISSUED    Transfusion Status       OK TO TRANSFUSE Performed at [facility], [place] [address], [place], [place] [address]      ROS:  Review of systems not obtained due to patient factors.  Physical Exam: Vitals:   12/31/2019 1345 01/20/2020 1400  BP:  96/73  Pulse: 77 75  Resp: 20 20  Temp: (!) 97.2 F (36.2 C) (!) 97.2 F (36.2 C)  SpO2: 100% 100%     General: Sedated, intubated, multiple drips HEENT: Pupils are reactive, mucous membranes moist Neck: No significant JVD appreciated Heart: Variable rate with Impella support Lungs: Anteriorly mostly clear Abdomen: Soft, nontender Extremities: Minimal edema Skin: Warm and dry Neuro: Sedated  Assessment/Plan: 69 year old black male with cardiomyopathy, status post CABG and Impella placement on 50/27 complicated by hypotension-required reexploration.  Has developed AKI and hyperkalemia as a result of above 1.Renal-normal renal function at baseline.  AKI in setting of CABG and Impella placement with prolonged hypotension, likely ATN.  Urine output is dwindling, now has developed life-threatening hyperkalemia which requires initiation of renal replacement therapy.  Given hemodynamic instability will initiate CRRT.  I have discussed this with his daughters at bedside and they are agreeable to proceed.  We will plan for no heparin, no volume removal and 0 potassium baths with frequent reevaluation of potassium 2.  Hyperkalemia-secondary to AKI and tissue damage.  Given calcium and lokelma until CRRT can be started-  will run on all 0 potassium baths with every 3 hours potassium checks in the short-term 3. Hypertension/volume  -does not seem that volume overloaded to my exam.  No volume removal with CRRT to start-however, is on 100% FiO2- requiring multiple pressors for BP support  4. Anemia  -situational but also concern over HIT.  Being worked up for that, no heparin with CRRT, pack catheter with citrate-transfuse as needed 5.  Cardiomyopathy-but felt to possibly be  reversible with revascularization.  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

## 2020-01-11 NOTE — Progress Notes (Signed)
This chaplain responded to Pt. Code Blue. Upon arrival the chaplain learned the healthcare team was stabilizing the Pt. The Pt. family, two daughters-, son in law, and grand-daughter are in the waiting area.  The chaplain introduced herself, offered a pastoral presence, and shared prayer with the family. The chaplain listened as the family connected the Pt. with his story.  F/U spiritual care was offered as needed.    The Pt. Daughter-Anitra is a Runner, broadcasting/film/video at [employer].

## 2020-01-11 NOTE — Progress Notes (Signed)
Precedex stopped because of hypotension. Patient still restless and agitated on precedex at 1.2.

## 2020-01-11 NOTE — Progress Notes (Signed)
Pharmacy Antibiotic Note  Adrian Phillips is a 69 y.o. male admitted on 01/16/2020 with sepsis/PNA. Pharmacy has been consulted for Vancomycin + Cefepime dosing.  CRRT started this afternoon - and currently tolerating. Will adjust antibiotic doses accordingly.   Plan: - Adjust Cefepime to 2g IV every 12 hours - No standing Vancomycin - obtain a random level in the AM - Will continue to follow CRRT tolerance, culture results, LOT, and antibiotic de-escalation plans   Height: 5\' 10"  (177.8 cm) Weight: 66 kg (145 lb 8.1 oz) IBW/kg (Calculated) : 73  Temp (24hrs), Avg:97.5 F (36.4 C), Min:93.6 F (34.2 C), Max:99.7 F (37.6 C)  Recent Labs  Lab 01/10/20 0426 01/10/20 0426 01/10/20 0754 01/10/20 1625 01/10/20 1644 01/10/20 1644 01/07/2020 0358 01/04/2020 0410 01/07/2020 0932 01/19/2020 1221 01/02/2020 1250 01/04/2020 1526 12/30/2019 1622 12/22/2019 1635  WBC 17.9*  --   --   --  17.9*  --  18.8*  --   --  11.5*  --  10.3  --   --   CREATININE 2.17*   < >  --   --  2.54*   < > 2.83*  --  3.40* 3.03* 3.80*  --   --  2.78*  LATICACIDVEN  --   --  8.6* 8.5*  --   --   --  10.1*  --   --   --   --  10.9*  --    < > = values in this interval not displayed.    Estimated Creatinine Clearance: 23.4 mL/min (A) (by C-G formula based on SCr of 2.78 mg/dL (H)).    Allergies  Allergen Reactions  . Lisinopril Swelling    Angioedema in 2020    Antimicrobials this admission: Rocephin 10/15 >> 10/19 Cefepime 10/20 >> Vancomycin 10/18 (pre/post op); 10/21 >>  Dose adjustments this admission: n/a  Microbiology results: 10/11 Fluvid >> neg 10/11 MRSA PCR >> neg 10/21 BCx >> ng<12h  Thank you for allowing pharmacy to be a part of this patient's care.  11/21, PharmD, BCPS Clinical Pharmacist Clinical phone for 01/13/2020: [phone] 01/03/2020 8:52 PM   **Pharmacist phone directory can now be found on [email or web address] ([email or web address]).  Listed under [facility].

## 2020-01-11 NOTE — Progress Notes (Signed)
      [address]       [place] [address]             [phone]      Events of earlier today noted  Intubated, sedated  BP (!) 65/53 (BP Location: Left Arm)   Pulse 90   Temp (!) 96.6 F (35.9 C)   Resp 20   Ht 5\' 10"  (1.778 m)   Wt 66 kg   SpO2 97%   BMI 20.88 kg/m  BP currently 143/65 Impella with 4.4 L flow CI= 2.7  Intake/Output Summary (Last 24 hours) at 01/08/2020 1937 Last data filed at 01/08/2020 1900 Gross per 24 hour  Intake 8140.11 ml  Output 1619 ml  Net 6521.11 ml   Co-ox 72 Lactic acid 10.9 K= 7.9- CVVHD with no K bag, given insulin, D50, calcium Hct= 30  Remains critically ill  01/13/2020 C. Viviann Spare, MD [facility] [phone]

## 2020-01-11 NOTE — Op Note (Signed)
NAME: Adrian Phillips, Adrian Phillips MEDICAL RECORD [id number] ACCOUNT [id number] DATE OF BIRTH:1950/04/04 FACILITY: [facility] LOCATION: [facility] PHYSICIAN:Ranald Alessio VAN TRIGT III, MD  OPERATIVE REPORT  DATE OF PROCEDURE:  12/28/2019  OPERATION: 1.  Mediastinal reexploration following previous coronary artery bypass graft with placement of Impella 5.5 left ventricular assist device. 2.  Placement of left femoral artery line to monitor blood pressure. 3.  Placement of Trialysis catheter, 20 cm, in the right subclavian vein.  SURGEON:  Kerin Perna, MD  PREOPERATIVE DIAGNOSIS:  Suspected mediastinal hematoma for the cause of suboptimal hemodynamics after coronary artery bypass graft and placement of Impella 5.5 left ventricular assist device.  POSTOPERATIVE DIAGNOSIS:  Suspected mediastinal hematoma for the cause of suboptimal hemodynamics after coronary artery bypass graft and placement of Impella 5.5 left ventricular assist device.    CLINICAL NOTE:  The patient is a 69 year old male who presented in heart failure with EF of 15% and a dilated left ventricle with severe coronary disease.  He was felt to be a high risk for CABG due to severe LV dysfunction, but a preoperative viability  study showed probable viable myocardium that would be expected to improve with revascularization.  He underwent CABG x3 with placement of a direct Impella 5.5 catheter through a graft in the ascending aorta 48 hours previously.He had significant coagulopathy postop with thrombocytopenia.  On exam in the ICU today,there was suspect that he had evidence of a mediastinal hematoma to cause a suboptimal performance of his LVAD and suboptimal hemodynamics.Prior daily postop echocardiograms showed no evidence of significant pericardial effusion or tamponade.  Informed consent was obtained from the family for sternal reexploration.  DESCRIPTION OF PROCEDURE:  The patient was brought directly from the ICU to the OR and placed supine  on the operating table.  He remained stable.  General anesthesia was induced.  A transesophageal echo probe was placed by the anesthesia team.  This  showed evidence of only a small pericardial effusion.  The Impella catheter was in good position.  The patient was prepped and draped as a sterile field.  The previous sternal incision was opened.  There was a small  amount of clotted and unclotted blood, which was cleared.  The main thrombus was in the tract of the anterior mediastinal  drain, which was about the same size as the drain.  Right ventricular function showed evidence of improvement after opening the chest and removal of the thin,  long anterior hematoma.  The vein grafts were patent with hemostatic anastomoses.  The LVAD graft to the ascending aorta was hemostatic.  The pursestrings for the aortic and atrial cannulation sites were also hemostatic.  The patient was washed out with warm sterile saline.  New drains were placed in both pleural spaces and in the anterior and the posterior mediastinum.  The sternum was closed again with wire.  The patient tolerated sternal closure.  His inotropic support  had significantly reduced by the procedure and he remained stable after chest closure.  The sternum and chest wall were closed in a standard fashion.  During the procedure, I placed a left femoral A-line because of poor functioning of the right radial A-line which probably significantly underestimated the arterial pressure.    After the chest was closed, a Trialysis catheter was placed using the Seldinger technique in the right subclavian vein and passed into the right atrium.  This was confirmed by chest x-ray to be in the proper position.  The patient then returned to the ICU intubated,  hemodynamically stable, but in critical condition.  VN/NUANCE  D:01-29-20 T:29-Jan-2020 JOB:[id number]

## 2020-01-11 NOTE — Progress Notes (Signed)
Patient's confusion and hallucination has worsened. Patient is not re directable. He is reaching for impella, swan and central line and attempting to get out of the bed. Order received for precedex and restraint. Will try precedex first.

## 2020-01-11 NOTE — Brief Op Note (Signed)
01/09/2020 - 01/07/2020  11:27 AM  PATIENT:  Adrian Phillips  69 y.o. male  PRE-OPERATIVE DIAGNOSIS:  post-op sternal reexploration   POST-OPERATIVE DIAGNOSIS:  post-op sternal reexploration   PROCEDURE:  Procedure(s): STERNAL RE EXPLORATION WITH PUMP STANDBY, REMOVAL OF HEMATOMA (N/A) TRANSESOPHAGEAL ECHOCARDIOGRAM (TEE) (N/A) INSERTION OF DIALYSIS CATHETER USING TRIALYSIS 17F 20CM  SURGEON:  Surgeon(s) and Role:    Kerin Perna, MD - Primary  PHYSICIAN ASSISTANT:   ASSISTANTS: none   ANESTHESIA:   general  EBL 200 cc  BLOOD ADMINISTERED:3 units CC PRBC  DRAINS: 2 mediastinal drains, bilateral pleural tubes   LOCAL MEDICATIONS USED:  NONE  SPECIMEN:  No Specimen  DISPOSITION OF SPECIMEN:  N/A  COUNTS:  YES  TOURNIQUET:  * No tourniquets in log *  DICTATION: .Dragon Dictation  PLAN OF CARE: return to ICU  PATIENT DISPOSITION:  ICU - intubated and hemodynamically stable.   Delay start of Pharmacological VTE agent (>24hrs) due to surgical blood loss or risk of bleeding: yes

## 2020-01-11 NOTE — Progress Notes (Addendum)
CRITICAL VALUE ALERT  Critical Value:  Potassium > 7.5  Date & Time Notied:  01/06/2020 1841  Provider Notified: Donata Clay MD  Orders Received/Actions taken: ABG and notify nephrology   Addendum: see new orders from [facility] MD. Donata Clay MD notified of ABG results and what nephrologist ordered. No new orders from him.

## 2020-01-11 NOTE — Progress Notes (Signed)
Notified Dr. Donata Clay of critical INR 4.1 and chest tube output 200 mL this hour. Orders received for 2 FPP, 2 cryo, and 1 platelet and to call him in the OR each hour with CT output.

## 2020-01-11 NOTE — Anesthesia Preprocedure Evaluation (Addendum)
Anesthesia Evaluation  Patient identified by MRN, date of birth, ID band Patient unresponsive    Reviewed: Allergy & Precautions, NPO status , Patient's Chart, lab work & pertinent test results  Airway Mallampati: II  TM Distance: >3 FB Neck ROM: Full    Dental   Pulmonary Current Smoker and Patient abstained from smoking.,       + intubated    Cardiovascular hypertension, Pt. on medications + angina + CAD, + CABG, + Peripheral Vascular Disease and +CHF  Normal cardiovascular exam Rhythm:Regular Rate:Normal  S/p CABG, impella placement Epi, Norepi, Vaso, amio infusions   Neuro/Psych negative neurological ROS     GI/Hepatic negative GI ROS, Neg liver ROS,   Endo/Other  negative endocrine ROS  Renal/GU negative Renal ROS     Musculoskeletal negative musculoskeletal ROS (+)   Abdominal   Peds  Hematology  (+) Blood dyscrasia, anemia ,   Anesthesia Other Findings Day of surgery medications reviewed with the patient.  Reproductive/Obstetrics                             Anesthesia Physical Anesthesia Plan  ASA: IV and emergent  Anesthesia Plan: General   Post-op Pain Management:    Induction: Intravenous and Inhalational  PONV Risk Score and Plan: 1 and Propofol infusion and Treatment may vary due to age or medical condition  Airway Management Planned: Oral ETT  Additional Equipment: Arterial line, CVP, PA Cath and TEE  Intra-op Plan:   Post-operative Plan: Post-operative intubation/ventilation  Informed Consent: I have reviewed the patients History and Physical, chart, labs and discussed the procedure including the risks, benefits and alternatives for the proposed anesthesia with the patient or authorized representative who has indicated his/her understanding and acceptance.     Only emergency history available  Plan Discussed with: CRNA  Anesthesia Plan Comments: (TEE FOR  INTRA-OP MONITORING ONLY)       Anesthesia Quick Evaluation

## 2020-01-11 NOTE — Progress Notes (Signed)
ABG ordered by Dr. Gaynell Face. Notified her of results. Orders received to increase PEEP to 8 and give 2 amps bicarb.

## 2020-01-11 NOTE — Progress Notes (Addendum)
NAME:  Adrian Phillips, MRN:  [id number], DOB:  06-16-1950, LOS: 10 ADMISSION DATE:  12/23/2019, CONSULTATION DATE:  01/10/20 REFERRING MD:  Marca Ancona, MD CHIEF COMPLAINT:  Acute hypoxemic respiratory failure  Brief History   69 year old male with emphysema and CAD admitted for NSTEMI and heart failure. Underwent CABG and Impella placement complicated by acute blood loss anemia secondary to left hemothorax s/p chest tube. PCCM consulted for hypoxemia  10/21 Underwent mediastinal reexploration with Dr. Morton Peters with significant clot seen on anterior mediastinal drain with brief PEA arrest after felt secondary to significant hyperkalemia with K 7.5 Received ACLS protocol with ROSC. Marland Kitchen Emergently started on CRRT  History of present illness   69 year old male active smoker who presented with shortness of breath and lower extremity edema and found with NSTEMI. Admitted for acute heart failure exacerbation/cardiogenic shock and NSTEMI. Cardiac cath showed occluded RCA, 95% pLAD, 80% OM1 and 80% OM2. Echo showed EF <20%. Underwent CABG and Impella placement complicated by acute blood loss anemia secondary to left hemothorax s/p chest tube.Underwent CABG and required Impella placement on 10/18. Extubated on 10/19. Despite diuresis and hemothorax improving, patient with persistent hypoxemia, PCCM consulted.  Patient and daughter provides history. He is an active smoker since 69 years old, smoking at least 1/2ppd. He works full time and denies any limitation in activity. At baseline he reports chronic cough with occasional sputum production and shortness of breath with heavy exertion. Occasionally wheezes. He does not use any inhalers at home. Has never been told he has COPD. He has never needed oxygen at home. He currently has fatigue, shortness of breath and wheezing.  Past Medical History  Emphysema CAD s/p CABG HTN HLD NSVT PAD s/p right fem-pop bypass Carotid stenosis  Significant  Hospital Events   10/18 CABG and Impellaplacement 10/21 Rexploration of mediastinum   Consults:  PCCM Card  Procedures:  10/12 LHC 10/18 CABG, Impella  Significant Diagnostic Tests:  CT Chest 01/04/20 - Moderate paraseptal and centrilobular emphysema  CXR 01/10/20 - Pulmonary edema and small bilateral pleural effusions, LVAD, Swan-Ganz, PICC, s/pt left chest tube x 2  Micro Data:  COVID 10/11 > Negative  MRSA PCR 10/11 > Negative  Blood culture 10/21 >  Antimicrobials:  Cefepime 10/21 > Vancomycin 10/21 >   Interim history/subjective:  Sedated on vent   Objective   Blood pressure (!) 46/17, pulse 74, temperature (!) 97.3 F (36.3 C), resp. rate 20, height 5\' 10"  (1.778 m), weight 66 kg, SpO2 99 %. PAP: (22-45)/(4-24) 31/18 CVP:  [0 mmHg-17 mmHg] 13 mmHg CO:  [4.8 L/min-6.2 L/min] 4.9 L/min CI:  [2.8 L/min/m2-3.6 L/min/m2] 2.8 L/min/m2  Vent Mode: PRVC FiO2 (%):  [100 %] 100 % Set Rate:  [20 bmp] 20 bmp Vt Set:  [580 mL] 580 mL PEEP:  [5 cmH20] 5 cmH20 Plateau Pressure:  [26 cmH20] 26 cmH20   Intake/Output Summary (Last 24 hours) at 12/22/2019 1551 Last data filed at 12/31/2019 1530 Gross per 24 hour  Intake 5509.65 ml  Output 1662 ml  Net 3847.65 ml   Filed Weights   January 20, 2020 0500 01/09/20 0618 01/10/20 0630  Weight: 55.8 kg 63.6 kg 66 kg    Physical Exam: General: Chronically ill appearing elderly male on mechanical ventilation, in NAD HEENT: ETT, MM pink/moist, PERRL,  Neuro: Sedated on vent CV: s1s2 regular rate and rhythm, no murmur, rubs, or gallops,  PULM:  Mechanical breath sounds, tolerating vent well  GI: soft, bowel sounds  active in all 4 quadrants, non-tender, non-distended Extremities: warm/dry, no edema  Skin: no rashes or lesions  Resolved Hospital Problem list     Assessment & Plan:   Acute hypoxemic respiratory failure secondary COPD exacerbation and pulm edema -Moderate paraseptal and centrilobular emphysema with superimposed  pulmonary edema on chest imaging. ABG reviewed. CVP 4-5. -Intubated perioperative and remained on vent post op   P: Continue ventilator support with lung protective strategies  Wean PEEP and FiO2 for sats greater than 90%. Head of bed elevated 30 degrees. Plateau pressures less than 30 cm H20.  Follow intermittent chest x-ray and ABG.   SAT/SBT as tolerated, mentation preclude extubation  Ensure adequate pulmonary hygiene  Follow cultures  Continue empiric antibiotics  VAP bundle in place  PAD protocol Continue steroids Bronchodilators  Obtain ABG now   Cardiogenic shock secondary to NSTEMI  -S/p CABG Acute systolic CHF/postop cardiogenic shock  -ECHO with EF 20% NSVT P: Cardiology and CTS following Continue midodrine, epinephrine, Levo, and vasopressin Impella in place  Optimize K and Mg Continue amiodarone  Continuous telemetry  Continue IV heparin   ABLA secondary to left hemothorax  -Has received 5Plt, 7FFP, 12PRBC, 4cryo P: Hgb currently stable  Monitor H&H  Tobacco abuse P: Cessation education provided    Best practice:  Diet: Per primary Pain/Anxiety/Delirium protocol (if indicated): Per primary VAP protocol (if indicated): NA DVT prophylaxis: Heparin GI prophylaxis: Per primary Glucose control: Per primary Mobility: As tolerated Code Status: Full Family Communication: Patient and daughter at bedside Disposition: Per primary  Labs   CBC: Recent Labs  Lab 01/09/20 2315 01/09/20 2323 01/10/20 0426 01/10/20 0446 01/10/20 1644 01/10/20 2238 01/06/2020 0358 01/21/2020 0407 12/28/2019 0932 01/18/2020 1035 01/07/2020 1221 01/06/2020 1240 12/24/2019 1250  WBC 17.0*  --  17.9*  --  17.9*  --  18.8*  --   --   --  11.5*  --   --   HGB 9.4*   < > 9.2*   < > 8.8*   < > 7.8*   < > 7.1* 6.5* 10.8* 9.9* 9.5*  HCT 28.7*   < > 27.2*   < > 26.7*   < > 23.8*   < > 21.0* 19.0* 33.7* 29.0* 28.0*  MCV 88.3  --  89.2  --  92.7  --  93.7  --   --   --  93.1  --   --   PLT  100*  --  87*  --  60*  --  33*  --   --   --  47*  --   --    < > = values in this interval not displayed.    Basic Metabolic Panel: Recent Labs  Lab 01/05/20 0345 01/06/20 0410 01/13/2020 2122 01/19/2020 2127 01/09/20 0502 01/09/20 3086 01/09/20 1738 01/09/20 1743 01/10/20 0426 01/10/20 0446 01/10/20 1644 01/10/20 2238 12/31/2019 0358 01/17/2020 0407 12/23/2019 0932 01/20/2020 1035 01/03/2020 1221 01/12/2020 1240 12/24/2019 1250  NA 137   < > 139   < > 138   < > 143   < > 142   < > 140   < > 143   < > 142 142 141 139 138  K 4.0   < > 5.1   < > 5.3*   < > 5.5*   < > 5.1   < > 4.8   < > 5.2*   < > 5.8* 5.6* 7.3* 7.9* 7.5*  CL 100   < > 111   < >  110   < > 106   < > 107   < > 106  --  108  --  105  --  105  --  101  CO2 26   < > 21*   < > 18*   < > 19*  --  23  --  18*  --  17*  --   --   --  18*  --   --   GLUCOSE 111*   < > 155*   < > 130*   < > 145*   < > 149*   < > 133*  --  68*  --  68*  --  114*  --  114*  BUN 16   < > 14   < > 14   < > 22   < > 29*   < > 40*  --  49*  --  51*  --  51*  --  54*  CREATININE 1.06   < > 1.02   < > 1.15   < > 1.82*   < > 2.17*   < > 2.54*  --  2.83*  --  3.40*  --  3.03*  --  3.80*  CALCIUM 9.0   < > 8.5*   < > 8.1*   < > 8.1*  --  8.1*  --  7.7*  --  7.4*  --   --   --  6.0*  --   --   MG 2.0  --  2.6*  --  2.1  --  2.1  --   --   --   --   --   --   --   --   --   --   --   --   PHOS  --   --   --   --   --   --   --   --   --   --  6.1*  --   --   --   --   --   --   --   --    < > = values in this interval not displayed.   GFR: Estimated Creatinine Clearance: 17.1 mL/min (A) (by C-G formula based on SCr of 3.8 mg/dL (H)). Recent Labs  Lab 01/10/20 0426 01/10/20 0754 01/10/20 1625 01/10/20 1644 01/17/2020 0358 01/04/2020 0410 01/07/2020 0642 01/15/2020 1221  PROCALCITON 43.79  --   --   --   --   --  29.26  --   WBC 17.9*  --   --  17.9* 18.8*  --   --  11.5*  LATICACIDVEN  --  8.6* 8.5*  --   --  10.1*  --   --     Liver Function Tests: Recent Labs   Lab 01/05/20 0345 01/10/20 1644 01/15/2020 0358 01/15/2020 1221  AST 22 3,030* 5,615* 6,642*  ALT 27 2,101* 3,466* 3,086*  ALKPHOS 93 53 66 121  BILITOT 0.6 0.8 2.7* 3.2*  PROT 5.7* 4.2* 4.0* 3.3*  ALBUMIN 2.9* 2.5* 2.4* 2.0*   No results for input(s): LIPASE, AMYLASE in the last 168 hours. No results for input(s): AMMONIA in the last 168 hours.  ABG    Component Value Date/Time   PHART 7.313 (L) 01/13/2020 1240   PCO2ART 40.4 12/24/2019 1240   PO2ART 227 (H) 01/10/2020 1240   HCO3 20.6 01/17/2020 1240   TCO2 20 (L) 12/22/2019 1250   ACIDBASEDEF 5.0 (H) 01/04/2020 1240  O2SAT 100.0 06-Feb-2020 1240     Coagulation Profile: Recent Labs  Lab 01/10/2020 1502 01/04/2020 2211 02/06/2020 1221  INR 1.5* 1.4* 4.1*    Cardiac Enzymes: Recent Labs  Lab 01/05/20 2144  CKTOTAL 40*    HbA1C: Hgb A1c MFr Bld  Date/Time Value Ref Range Status  12/31/2019 05:53 AM 5.4 4.8 - 5.6 % Final    Comment:    (NOTE) Pre diabetes:          5.7%-6.4%  Diabetes:              >6.4%  Glycemic control for   <7.0% adults with diabetes     CBG: Recent Labs  Lab 01/10/20 2055 February 06, 2020 0023 2020-02-06 0405 2020-02-06 0443 2020-02-06 0556  GLUCAP 109* 98 65* 137* 105*    Critical care time:    Performed by: Delfin Gant  Total critical care time: 45 minutes  Critical care time was exclusive of separately billable procedures and treating other patients.  Critical care was necessary to treat or prevent imminent or life-threatening deterioration.  Critical care was time spent personally by me on the following activities: development of treatment plan with patient and/or surrogate as well as nursing, discussions with consultants, evaluation of patient's response to treatment, examination of patient, obtaining history from patient or surrogate, ordering and performing treatments and interventions, ordering and review of laboratory studies, ordering and review of radiographic studies, pulse  oximetry and re-evaluation of patient's condition.  Delfin Gant, NP-C [facility] Contact / Pager information can be found on Amion  02-06-20, 4:33 PM

## 2020-01-11 NOTE — Progress Notes (Addendum)
Pharmacy Antibiotic Note  Adrian Phillips is a 69 y.o. male s/p CABG and Impella placement 10/18 with new AMS, hypoxia and hypotension, possible sepsis.  Pharmacy has been consulted for Vancomycin  dosing.  Plan: Vancomycin 1250 mg IV  Now, then 1 g IV q48h Change Cefepime 2 g IV q24h F/U renal function  Height: 5\' 10"  (177.8 cm) Weight: 66 kg (145 lb 8.1 oz) IBW/kg (Calculated) : 73  Temp (24hrs), Avg:98.7 F (37.1 C), Min:98.1 F (36.7 C), Max:99.7 F (37.6 C)  Recent Labs  Lab 01/09/20 1415 01/09/20 1415 01/09/20 1738 01/09/20 2315 01/10/20 0426 01/10/20 0754 01/10/20 1625 01/10/20 1644 12/25/2019 0358 01/20/2020 0410  WBC 15.7*   < > 16.5* 17.0* 17.9*  --   --  17.9* 18.8*  --   CREATININE 1.71*  --  1.82*  --  2.17*  --   --  2.54* 2.83*  --   LATICACIDVEN  --   --   --   --   --  8.6* 8.5*  --   --  10.1*   < > = values in this interval not displayed.    Estimated Creatinine Clearance: 23 mL/min (A) (by C-G formula based on SCr of 2.83 mg/dL (H)).    Allergies  Allergen Reactions  . Lisinopril Swelling    Angioedema in 2020    2021 01/12/2020 6:44 AM

## 2020-01-11 NOTE — Anesthesia Postprocedure Evaluation (Signed)
Anesthesia Post Note  Patient: Adrian Phillips  Procedure(s) Performed: STERNAL RE EXPLORATION WITH PUMP STANDBY, REMOVAL OF HEMATOMA (N/A Chest) TRANSESOPHAGEAL ECHOCARDIOGRAM (TEE) (N/A ) INSERTION OF DIALYSIS CATHETER USING TRIALYSIS 25F 20CM     Patient location during evaluation: SICU Anesthesia Type: General Level of consciousness: sedated Pain management: pain level controlled Vital Signs Assessment: vitals unstable Respiratory status: patient remains intubated per anesthesia plan Cardiovascular status: unstable Postop Assessment: no apparent nausea or vomiting Anesthetic complications: no   No complications documented.  Last Vitals:  Vitals:   01/16/2020 1415 01/16/2020 1425  BP: (!) 46/17 (!) 37/27  Pulse: 74   Resp: 20 20  Temp: (!) 36.3 C (!) 36.4 C  SpO2: 99%     Last Pain:  Vitals:   01/05/2020 1300  TempSrc: Core  PainSc:                  Cecile Hearing

## 2020-01-11 NOTE — Progress Notes (Signed)
  PCCM Interval Note  Called to bedside reference to worsening mental status, hypoxia, and hypotension with SBP in the 60's despite being on epi 20 mcg/min, NE 60 mcg/min, and vasopressin.    Co-ox 59 ABG 7.3/ 40/ 70/ 20, Hgb noted 7.1.  No significant output in CT drainage.  Also noted on am labs, worsening LFTs, sCr, LDH   Patient found minimally responsive and agonal.  BVM performed till anesthesia arrived and intubated patient.    TCTS notified by bedside RN  P:  Full MV support CXR/ ABG post intubation  VAP bundle PAD with fentanyl/ versed prn for RASS goal 0/-1 Plan to transfuse for Hgb > 8 Check PCT (previous 43.79) and send BCx2 Check cortisol, start empiric solu-cortef On cefepime, will broaden coverage with vancomycin Bicarb gtt   Daughter has been called and on her way to the hospital.     Posey Boyer, ACNP [facility] 01/02/2020, 6:47 AM

## 2020-01-11 NOTE — Progress Notes (Signed)
1403: Received call from OR with order for 20 mcg DDAVP.  1416: Blood bank called to notify this RN that blood products were ready. NT sent down to get products.   1423: Paged Shirlee Latch MD to notify of patient losing all pulsatility on arterial line and waveform on impella. MAP around 62-63. MD in cath lab.  1433: Paged Donata Clay MD to notify of above. Ordered to get blood products already ordered in to patient. Also received order to give enough pRBCs to replace volume lost in CTs (250 mL) and for each unit of blood, give one unit platelets.   1448: Blood products arrived. This RN checking them with Michail Jewels RN when patient went asystolic. Code blue called and CPR started immediately. See code sheet. Called Donata Clay MD to notify him of events. Laneta Simmers MD and Shirlee Latch MD to bedside.  1457: ROSC achieved. See code blue sheet for further details.   After the code blue, STAT echo performed. McLean MD adjusted position on impella to 33 cm. CRRT started at 1537.  During code blue, blood products were administered emergently using pressure bags with unit numbers as follows:  #[id number] (cryo) #[id number] (platelets) [id number] (FFP) #[id number] (platelets) #[id number] (FFP) #[id number] (cryo) #[id number] (pRBCs) #[id number] (platelets)  After events, Bartle MD ordered to send STAT CBC. Hemoglobin 8.5. Okay with not giving any more blood products than administered so far. Also okay to keep CRRT running positive d/t patient's BP low.  Family at bedside and updated on events.

## 2020-01-11 NOTE — Telephone Encounter (Signed)
FMLA form completed for [employer]( Anitra Bell) and Faxed to [phone] Indeterminate leave beginning 01/09/20 through 07/09/20/ original form mailed to home address

## 2020-01-11 NOTE — Progress Notes (Signed)
  Echocardiogram Echocardiogram Transesophageal has been performed.  Janalyn Harder 01/17/2020, 9:21 AM

## 2020-01-11 NOTE — Progress Notes (Signed)
3 Days Post-Op Procedure(s) (LRB): CORONARY ARTERY BYPASS GRAFTING (CABG) x 3  USING LEFT INTERNAL MAMMARY ARTERY AND LEFT GREATER SAPHENOUS VEIN. LIMA TO LAD, SVG TO OM1, SVG TO OM2 (N/A) PLACEMENT OF IMPELLA LEFT VENTRICULAR ASSIST DEVICE 5.5 SN [id number] (N/A) TRANSESOPHAGEAL ECHOCARDIOGRAM (TEE) (N/A) ENDOVEIN HARVEST OF GREATER SAPHENOUS VEIN (Left) Subjective: Re-intubated , drop in HB overnight CXR with increased effusions I am concerned about delayed bleeding and possible tamponade Will return to OR for mediastinal re-exploration- discussed with family and consent provided Objective: Vital signs in last 24 hours: Temp:  [98.1 F (36.7 C)-99.7 F (37.6 C)] 99 F (37.2 C) (10/21 0700) Pulse Rate:  [89-115] 115 (10/21 0700) Cardiac Rhythm: Normal sinus rhythm (10/20 2000) Resp:  [18-31] 30 (10/21 0155) BP: (61-141)/(46-116) 105/92 (10/21 0700) SpO2:  [59 %-100 %] 100 % (10/21 0700) Arterial Line BP: (60-124)/(43-73) 94/60 (10/21 0700) FiO2 (%):  [100 %] 100 % (10/21 0626)  Hemodynamic parameters for last 24 hours: PAP: (22-48)/(4-24) 33/20 CVP:  [0 mmHg-17 mmHg] 13 mmHg CO:  [4.8 L/min-5.1 L/min] 5 L/min CI:  [2.8 L/min/m2-3 L/min/m2] 2.9 L/min/m2  Intake/Output from previous day: 10/20 0701 - 10/21 0700 In: 3517.6 [I.V.:2666.9; Blood:345; IV Piggyback:249.9] Out: 1877 [Urine:897; Chest Tube:980] Intake/Output this shift: No intake/output data recorded.       Exam    General- alert on vent    Neck- no JVD, no cervical adenopathy palpable, no carotid bruit   Lungs-dimin   Cor- regular rate and rhythm, no murmur , gallop   Abdomen- soft, non-tender   Extremities - warm, non-tender, minimal edema   Neuro- oriented, appropriate, no focal weakness   Lab Results: Recent Labs    01/10/20 1644 01/10/20 2238 01/13/2020 0358 01/07/2020 0358 12/31/2019 0407 01/10/2020 0546  WBC 17.9*  --  18.8*  --   --   --   HGB 8.8*   < > 7.8*   < > 7.1* 5.4*  HCT 26.7*   < > 23.8*   < >  21.0* 16.0*  PLT 60*  --  33*  --   --   --    < > = values in this interval not displayed.   BMET:  Recent Labs    01/10/20 1644 01/10/20 2238 01/17/2020 0358 01/04/2020 0358 12/24/2019 0407 12/28/2019 0546  NA 140   < > 143   < > 142 145  K 4.8   < > 5.2*   < > 5.1 5.0  CL 106  --  108  --   --   --   CO2 18*  --  17*  --   --   --   GLUCOSE 133*  --  68*  --   --   --   BUN 40*  --  49*  --   --   --   CREATININE 2.54*  --  2.83*  --   --   --   CALCIUM 7.7*  --  7.4*  --   --   --    < > = values in this interval not displayed.    PT/INR:  Recent Labs    2020/02/05 2211  LABPROT 16.3*  INR 1.4*   ABG    Component Value Date/Time   PHART 7.222 (L) 01/12/2020 0546   HCO3 20.4 01/21/2020 0546   TCO2 22 01/19/2020 0546   ACIDBASEDEF 7.0 (H) 12/31/2019 0546   O2SAT 90.0 01/13/2020 0546   CBG (last 3)  Recent Labs  12/29/2019 0023 01/15/2020 0405 01/07/2020 0443  GLUCAP 98 65* 137*    Assessment/Plan: S/P Procedure(s) (LRB): CORONARY ARTERY BYPASS GRAFTING (CABG) x 3  USING LEFT INTERNAL MAMMARY ARTERY AND LEFT GREATER SAPHENOUS VEIN. LIMA TO LAD, SVG TO OM1, SVG TO OM2 (N/A) PLACEMENT OF IMPELLA LEFT VENTRICULAR ASSIST DEVICE 5.5 SN [id number] (N/A) TRANSESOPHAGEAL ECHOCARDIOGRAM (TEE) (N/A) ENDOVEIN HARVEST OF GREATER SAPHENOUS VEIN (Left) Delayed bleeding- possible tamponade Return to OR for possible tamponade, Rv dysfunction or both Family notified and patient d/w Dr Shirlee Latch for coordination of care  LOS: 10 days    Kathlee Nations Trigt III 12/27/2019

## 2020-01-11 NOTE — Progress Notes (Signed)
ANTICOAGULATION CONSULT NOTE   Pharmacy Consult for Bivalirudin Indication: Impella  Allergies  Allergen Reactions  . Lisinopril Swelling    Angioedema in 2020    Patient Measurements: Height: 5\' 10"  (177.8 cm) Weight: 66 kg (145 lb 8.1 oz) IBW/kg (Calculated) : 73 Heparin Dosing Weight: 56kg  Vital Signs: Temp: 99.1 F (37.3 C) (10/21 0100) BP: 110/67 (10/20 2000) Pulse Rate: 104 (10/21 0100)  Labs: Recent Labs    01/09/2020 1502 01/12/2020 2122 01/19/2020 2211 12/29/2019 2219 01/09/20 1738 01/09/20 1743 01/09/20 2315 01/09/20 2323 01/10/20 0426 01/10/20 0446 01/10/20 1121 01/10/20 1121 01/10/20 1622 01/10/20 1644 01/10/20 2238 01/21/2020 0201  HGB 10.2*  9.5*   < >  --    < > 8.2*   < > 9.4*   < > 9.2*   < > 7.8*   < >  --  8.8* 7.1*  --   HCT 30.0*  30.7*   < >  --    < > 25.2*   < > 28.7*   < > 27.2*   < > 23.0*  --   --  26.7* 21.0*  --   PLT 117*   < >  --    < > 106*   < > 100*  --  87*  --   --   --   --  60*  --   --   APTT 35  --   --    < > 53*   < >  --   --  44*  --   --   --   --  46*  --  75*  LABPROT 18.0*  --  16.3*  --   --   --   --   --   --   --   --   --   --   --   --   --   INR 1.5*  --  1.4*  --   --   --   --   --   --   --   --   --   --   --   --   --   HEPARINUNFRC  --   --   --    < > <0.10*  --   --   --  <0.10*  --   --   --  <0.10*  --   --   --   CREATININE  --    < >  --    < > 1.82*  --   --   --  2.17*  --   --   --   --  2.54*  --   --    < > = values in this interval not displayed.    Estimated Creatinine Clearance: 25.6 mL/min (A) (by C-G formula based on SCr of 2.54 mg/dL (H)).  Assessment: 69 yo male with NSTEMI s/p CABG and Impella placement, concern for HIT, for bivalirudin  Goal of Therapy:  APTT 50-60 sec Monitor platelets by anticoagulation protocol: Yes   Plan:  Decrease bivalirudin 0.03 mg/kg/hr APTT in 4 hours  77, PharmD, BCPS

## 2020-01-11 NOTE — Progress Notes (Signed)
Hypoglycemic Event  CBG: 65  Treatment: 12.5g D50  Symptoms: none  Follow-up CBG: Time:0445 CBG Result:137  Possible Reasons for Event: npo status  Comments/MD notified protocol    Amo Kuffour, Alinda Money

## 2020-01-11 NOTE — Progress Notes (Signed)
Patient is minimally responsive and has a blank stare. CCm and anesthesia called to bedside, Surgeon on call and family notified about patient's condition.

## 2020-01-11 NOTE — Progress Notes (Signed)
Patient ID: Adrian Phillips, male   DOB: 01/19/1951, 69 y.o.   MRN: [id number]  Called to bedside for PEA arrest with ACLS in progress.  Suspect due to K 7.5.  Patient had several rounds of epinephrine + calcium chloride + HCO3 as well as insulin/D50. He received Lokelma earlier.  He regained a perfusing rhythm with these interventions and was hypertensive.  Impella position reassessed by echo, was deep and retracted about 1 cm.  Impella increased back to P8, no alarms.  Currently, he is on milrinone + epinephrine 10 + norepinephrine 18 with stable MAP.    CVVH machine is in room and will be started.  Follow K.   CRITICAL CARE Performed by: Marca Ancona  Total critical care time: 35 minutes  Critical care time was exclusive of separately billable procedures and treating other patients.  Critical care was necessary to treat or prevent imminent or life-threatening deterioration.  Critical care was time spent personally by me on the following activities: development of treatment plan with patient and/or surrogate as well as nursing, discussions with consultants, evaluation of patient's response to treatment, examination of patient, obtaining history from patient or surrogate, ordering and performing treatments and interventions, ordering and review of laboratory studies, ordering and review of radiographic studies, pulse oximetry and re-evaluation of patient's condition.  Marca Ancona 01/13/2020 3:20 PM

## 2020-01-11 NOTE — Plan of Care (Signed)
  Problem: Education: °Goal: Knowledge of General Education information will improve °Description: Including pain rating scale, medication(s)/side effects and non-pharmacologic comfort measures °Outcome: Progressing °  °Problem: Health Behavior/Discharge Planning: °Goal: Ability to manage health-related needs will improve °Outcome: Progressing °  °Problem: Clinical Measurements: °Goal: Ability to maintain clinical measurements within normal limits will improve °Outcome: Progressing °Goal: Will remain free from infection °Outcome: Progressing °Goal: Diagnostic test results will improve °Outcome: Progressing °Goal: Respiratory complications will improve °Outcome: Progressing °Goal: Cardiovascular complication will be avoided °Outcome: Progressing °  °Problem: Activity: °Goal: Risk for activity intolerance will decrease °Outcome: Progressing °  °Problem: Nutrition: °Goal: Adequate nutrition will be maintained °Outcome: Progressing °  °Problem: Coping: °Goal: Level of anxiety will decrease °Outcome: Progressing °  °Problem: Elimination: °Goal: Will not experience complications related to bowel motility °Outcome: Progressing °Goal: Will not experience complications related to urinary retention °Outcome: Progressing °  °Problem: Pain Managment: °Goal: General experience of comfort will improve °Outcome: Progressing °  °Problem: Safety: °Goal: Ability to remain free from injury will improve °Outcome: Progressing °  °Problem: Skin Integrity: °Goal: Risk for impaired skin integrity will decrease °Outcome: Progressing °  °Problem: Education: °Goal: Ability to demonstrate management of disease process will improve °Outcome: Progressing °Goal: Ability to verbalize understanding of medication therapies will improve °Outcome: Progressing °Goal: Individualized Educational Video(s) °Outcome: Progressing °  °Problem: Activity: °Goal: Capacity to carry out activities will improve °Outcome: Progressing °  °Problem: Cardiac: °Goal:  Ability to achieve and maintain adequate cardiopulmonary perfusion will improve °Outcome: Progressing °  °Problem: Education: °Goal: Will demonstrate proper wound care and an understanding of methods to prevent future damage °Outcome: Progressing °Goal: Knowledge of disease or condition will improve °Outcome: Progressing °Goal: Knowledge of the prescribed therapeutic regimen will improve °Outcome: Progressing °Goal: Individualized Educational Video(s) °Outcome: Progressing °  °Problem: Activity: °Goal: Risk for activity intolerance will decrease °Outcome: Progressing °  °Problem: Cardiac: °Goal: Will achieve and/or maintain hemodynamic stability °Outcome: Progressing °  °Problem: Respiratory: °Goal: Respiratory status will improve °Outcome: Progressing °  °

## 2020-01-11 NOTE — Progress Notes (Deleted)
RN is going to walk patient around unit. Will place ALINE once patient is back in room and settled.   

## 2020-01-11 NOTE — Progress Notes (Signed)
Notified Dr. Donata Clay of potassium 7.5 and ionized calcium 0.80. Orders received for 10 mg lokelma and 1 g calcium chloride in 250 mL albumin.

## 2020-01-11 NOTE — Transfer of Care (Signed)
Immediate Anesthesia Transfer of Care Note  Patient: Adrian Phillips  Procedure(s) Performed: STERNAL RE EXPLORATION WITH PUMP STANDBY, REMOVAL OF HEMATOMA (N/A Chest) TRANSESOPHAGEAL ECHOCARDIOGRAM (TEE) (N/A ) INSERTION OF DIALYSIS CATHETER USING TRIALYSIS 83F 20CM  Patient Location: ICU  Anesthesia Type:General  Level of Consciousness: Patient remains intubated per anesthesia plan  Airway & Oxygen Therapy: Patient remains intubated per anesthesia plan and Patient placed on Ventilator (see vital sign flow sheet for setting)  Post-op Assessment: Report given to RN and Post -op Vital signs reviewed and stable  Post vital signs: Reviewed and stable  Last Vitals:  Vitals Value Taken Time  BP 123/43   Temp    Pulse 89 12/26/2019 1214  Resp 20 01/16/2020 1214  SpO2 96 % 01/10/2020 1214  Vitals shown include unvalidated device data.  Last Pain:  Vitals:   01/21/2020 0730  TempSrc: Core  PainSc:       Patients Stated Pain Goal: 2 (01/10/20 0505)  Complications: No complications documented.

## 2020-01-11 NOTE — Progress Notes (Signed)
Patient ID: Adrian Phillips, male   DOB: 07-22-1950, 69 y.o.   MRN: [id number]     Advanced Heart Failure Rounding Note  PCP-Cardiologist: Jenkins Rouge, MD   Subjective:    CABG (LIMA-LAD, SVG-OM1, SVG-dCFx) + Impella 5.5 on 10/18.   Patient with marked worsening overnight, more hypotensive and intubated.  Currently awake on vent and will follow commands.  He is now on epinephrine 30, NE 70, vasopressin 0.03. He is in NSR on amiodarone.  UOP 897 cc yesterday, creatinine up to 897.  He is on hydrocortisone 50 q6 hrs.    Hgb 7.8 today, getting 1 unit PRBCs.    He is in NSR on amiodarone gtt.   Afebrile, has been on cefepime.    Impella:  P8, good waveforms with no alarms.  Flow 4 L/min LDH 236 => 1696 => 4234 Position looked ok on echo yesterday.   [place]wan: CVP 12 PA 34/16 CI 2.9  Cardiac MRI:  1. Moderate LVE with diffuse hypokinesis worse in the septum and apex EF 26% 2. Post gadolinium images with sub-endocardial scar in the mid distal anterior wall, septum and apex. No transmural or full thickness scar suggesting possibility of LVEF recovery with revascularization.    Objective:   Weight Range: 66 kg Body mass index is 20.88 kg/m.   Vital Signs:   Temp:  [98.1 F (36.7 C)-99.7 F (37.6 C)] 99 F (37.2 C) (10/21 0700) Pulse Rate:  [89-115] 115 (10/21 0700) Resp:  [18-31] 30 (10/21 0155) BP: (61-141)/(46-116) 105/92 (10/21 0700) SpO2:  [59 %-100 %] 100 % (10/21 0700) Arterial Line BP: (60-124)/(43-73) 94/60 (10/21 0700) FiO2 (%):  [100 %] 100 % (10/21 0626) Last BM Date: 01/18/2020  Weight change: Filed Weights   12/31/2019 0500 01/09/20 0618 01/10/20 0630  Weight: 55.8 kg 63.6 kg 66 kg    Intake/Output:   Intake/Output Summary (Last 24 hours) at 01/13/2020 0751 Last data filed at 01/10/2020 0700 Gross per 24 hour  Intake 3530.4 ml  Output 1877 ml  Net 1653.4 ml      Physical Exam    General: Awake on vent.  Neck: JVP 10 cm, no thyromegaly or  thyroid nodule.  Lungs: Decreased at bases.  CV: Nondisplaced PMI.  Heart regular S1/S2, no S3/S4, no murmur.  No peripheral edema.   Abdomen: Soft, nontender, no hepatosplenomegaly, no distention.  Skin: Intact without lesions or rashes.  Neurologic: Follows commands Extremities: No clubbing or cyanosis.  HEENT: Normal.    Telemetry   NSR 90s, Personally reviewed   Labs    CBC Recent Labs    01/10/20 1644 01/10/20 2238 12/31/2019 0358 01/16/2020 0407 01/04/2020 0546 01/12/2020 0718  WBC 17.9*  --  18.8*  --   --   --   HGB 8.8*   < > 7.8*   < > 5.4* 6.5*  HCT 26.7*   < > 23.8*   < > 16.0* 19.0*  MCV 92.7  --  93.7  --   --   --   PLT 60*  --  33*  --   --   --    < > = values in this interval not displayed.   Basic Metabolic Panel Recent Labs    01/09/20 0502 01/09/20 0607 01/09/20 1738 01/09/20 1743 01/10/20 1644 01/10/20 2238 01/03/2020 0358 01/16/2020 0407 01/17/2020 0546 01/04/2020 0718  NA 138   < > 143   < > 140   < > 143   < > 145 143  K 5.3*   < > 5.5*   < > 4.8   < > 5.2*   < > 5.0 6.3*  CL 110   < > 106   < > 106  --  108  --   --   --   CO2 18*   < > 19*   < > 18*  --  17*  --   --   --   GLUCOSE 130*   < > 145*   < > 133*  --  68*  --   --   --   BUN 14   < > 22   < > 40*  --  49*  --   --   --   CREATININE 1.15   < > 1.82*   < > 2.54*  --  2.83*  --   --   --   CALCIUM 8.1*   < > 8.1*   < > 7.7*  --  7.4*  --   --   --   MG 2.1  --  2.1  --   --   --   --   --   --   --   PHOS  --   --   --   --  6.1*  --   --   --   --   --    < > = values in this interval not displayed.   Liver Function Tests Recent Labs    01/10/20 1644 01/10/2020 0358  AST 3,030* 5,615*  ALT 2,101* 3,466*  ALKPHOS 53 66  BILITOT 0.8 2.7*  PROT 4.2* 4.0*  ALBUMIN 2.5* 2.4*   No results for input(s): LIPASE, AMYLASE in the last 72 hours. Cardiac Enzymes No results for input(s): CKTOTAL, CKMB, CKMBINDEX, TROPONINI in the last 72 hours.  BNP: BNP (last 3 results) Recent Labs     01/10/2020 0151  BNP 269.4*    ProBNP (last 3 results) No results for input(s): PROBNP in the last 8760 hours.   D-Dimer No results for input(s): DDIMER in the last 72 hours. Hemoglobin A1C No results for input(s): HGBA1C in the last 72 hours. Fasting Lipid Panel No results for input(s): CHOL, HDL, LDLCALC, TRIG, CHOLHDL, LDLDIRECT in the last 72 hours. Thyroid Function Tests No results for input(s): TSH, T4TOTAL, T3FREE, THYROIDAB in the last 72 hours.  Invalid input(s): FREET3  Other results:   Imaging    ECHOCARDIOGRAM LIMITED  Result Date: 01/10/2020    ECHOCARDIOGRAM LIMITED REPORT   Patient Name:   Adrian Phillips Date of Exam: 01/10/2020 Medical Rec #:  [id number]             Height:       70.0 in Accession #:    [id number]            Weight:       145.5 lb Date of Birth:  Oct 17, 1950             BSA:          1.823 m Patient Age:    7 years              BP:           91/57 mmHg Patient Gender: M                     HR:           107 bpm. Exam Location:  Inpatient Procedure:  Limited Echo STAT ECHO Indications:    I31.3 Pericardial effusion  History:        Patient has no prior history of Echocardiogram examinations,                 most recent 01/09/2020. Risk Factors:Hypertension and                 Dyslipidemia. Impella placement.  Sonographer:    Jonelle Sidle Dance Referring Phys: [place] [facility]  1. Left ventricular ejection fraction, by estimation, is <20%. The left ventricle has severely decreased function. The left ventricle demonstrates global hypokinesis.  2. Moderate pericardial effusion, 1.1 cm posterior to LV. Marland Kitchen Moderate pericardial effusion.  3. Impella in place, appears to be 5.9cm from aortic cusps. Comparison(s): Prior images reviewed side by side. Conclusion(s)/Recommendation(s): Limited study performed. Impella assist device in place, not as well visualized as yesterday. Appears to be 5.9 cm into LV from aortic cusps, but only able to be measured  in one view. Per chart, impella has been respoitioned as a result of the images. There is a moderate pericardial effusion seen posterior to the LV. Cannot be fully assessed based on limited windows. Appears similar to echo done yesterday. FINDINGS  Left Ventricle: Left ventricular ejection fraction, by estimation, is <20%. The left ventricle has severely decreased function. The left ventricle demonstrates global hypokinesis. Pericardium: Moderate pericardial effusion, 1.1 cm posterior to LV. A moderately sized pericardial effusion is present. Aorta: Impella in place, appears to be 5.9cm from aortic cusps. Additional Comments: A venous catheter is visualized in the right atrium and right ventricle. Buford Dresser MD Electronically signed by Buford Dresser MD Signature Date/Time: 01/10/2020/10:49:06 AM    Final      Medications:     Scheduled Medications: . sodium chloride   Intravenous Once  . sodium chloride   Intravenous Once  . acetaminophen  1,000 mg Per Tube Q6H   Or  . acetaminophen (TYLENOL) oral liquid 160 mg/5 mL  1,000 mg Per Tube Q6H  . aspirin EC  325 mg Oral Daily   Or  . aspirin  324 mg Per Tube Daily  . bisacodyl  10 mg Oral Daily   Or  . bisacodyl  10 mg Rectal Daily  . chlorhexidine gluconate (MEDLINE KIT)  15 mL Mouth Rinse BID  . Chlorhexidine Gluconate Cloth  6 each Topical Daily  . dextrose      . docusate sodium  200 mg Oral Daily  . fentaNYL (SUBLIMAZE) injection  25 mcg Intravenous Once  . furosemide  20 mg Intravenous BID  . hydrocortisone sod succinate (SOLU-CORTEF) inj  50 mg Intravenous Q6H  . influenza vaccine adjuvanted  0.5 mL Intramuscular Tomorrow-1000  . insulin aspart  0-24 Units Subcutaneous Q4H  . ipratropium-albuterol  3 mL Nebulization Q6H  . mouth rinse  15 mL Mouth Rinse 10 times per day  . polyethylene glycol  17 g Oral Daily  . sodium bicarbonate      . sodium chloride flush  10-40 mL Intracatheter Q12H  . sodium chloride flush   3 mL Intravenous Q12H  . thiamine  100 mg Oral Daily   Or  . thiamine  100 mg Intravenous Daily    Infusions: . sodium chloride 20 mL/hr at 01/21/2020 0700  . sodium chloride    . sodium chloride 20 mL/hr at 01/09/20 1546  . sodium chloride 20 mL/hr at 12/30/2019 0700  . sodium chloride    . amiodarone 30 mg/hr (  12/23/2019 0700)  . bivalirudin (ANGIOMAX) infusion 0.5 mg/mL (Non-ACS indications) 0.03 mg/kg/hr (12/22/2019 0700)  . ceFEPime (MAXIPIME) IV    . dextrose 5 % Impella 5.0 Purge solution    . epinephrine 30 mcg/min (12/28/2019 0700)  . famotidine (PEPCID) IV Stopped (01/10/20 1043)  . fentaNYL infusion INTRAVENOUS 25 mcg/hr (01/12/2020 0700)  . milrinone 0.375 mcg/kg/min (12/24/2019 0700)  . norepinephrine (LEVOPHED) Adult infusion 70 mcg/min (01/17/2020 0700)  . sodium bicarbonate (isotonic) 150 mEq in D5W 1000 mL infusion    . [START ON 01/13/2020] vancomycin    . vancomycin 1,250 mg (12/26/2019 0736)  . vasopressin 0.03 Units/min (01/07/2020 0700)    PRN Medications: sodium chloride, Place/Maintain arterial line **AND** sodium chloride, fentaNYL, midazolam, midazolam, ondansetron (ZOFRAN) IV, sodium chloride flush, sodium chloride flush     Assessment/Plan   1. CAD: NSTEMI with cath showing totally occluded RCA with collaterals from LAD, 95% proximal LAD, 80% OM1 and 80% OM2.  Suspect he would be best-served by CABG.  Cardiac MRI showed significant viability, would expect to see improvement in LV function with revascularization. Now s/p CABG with LIMA-LAD, SVG-OM1, SVG-dCFX.  - Continue ASA 325 daily.  - Continue atorvastatin 80 mg daily.  2. Acute systolic CHF/post-op cardiogenic shock: Ischemic cardiomyopathy.  Echo this admission with EF < 20%, normal RV.  RHC showed CI mildly decreased at 1.93 with mildly elevated PCWP and normal RA pressure.  Pre-op cardiac MRI shows significant viability, would expect to see improved LV function with revascularization.  Now post-CABG with Impella  5.5 in place functioning normally, good position on echo yesterday.  CI 2.9 this morning with CVP 12 and co-ox 59%.  He became markedly hypotensive overnight and is now intubated.  He remains on NE 70, milrinone 0.375, vasopressin 0.03, epinephrine 30, MAP now stable.  Creatinine rising.  Cause not totally clear => no fever but cannot rule out septic shock component.  Concerned about possible fluid collection around heart with tamponade physiology (there was a pericardial effusion on echo yesterday but did not appear to be causing tamponade).  - He will return to the OR this morning for chest washout and full evaluation.   - Continue Impella P8 for now.  Position ok under echo yesterday, mildly deep but stable position. Think rise in LDH is due to bleeding/inflammation and not hemolysis from Impella.  Will reassess under TEE in OR.   - Continue milrinone + pressors, titrate as able.    3. NSVT: Noted runs this admission.  No ectopy noted now that he is on amiodarone.  - Amiodarone gtt.   4. COPD: Active smoker with significant COPD on CT.  He is on hydrocortisone 50 q6 hrs.  5. PAD: h/o right fem-pop bypass.  6. Carotid stenosis: Asymptomatic 60-79% RICA stenosis.  7. ID: Afebrile with WBCs elevated at 18.  ?Component of septic shock.  - Cultures sent.  - Has been on cefepime, adding vancomycin.  8. Anemia: Post-op blood loss. Transfuse Hgb < 8.  - 1 unit now.  9. Acute hypoxemic respiratory failure: Intubated. No definite PNA.  - CCM following.  10. AKI: Creatinine up to 2.8.  Worsening with hypotension.  Maintain MAP and CO.   CRITICAL CARE Performed by: Loralie Champagne  Total critical care time: 40 minutes  Critical care time was exclusive of separately billable procedures and treating other patients.  Critical care was necessary to treat or prevent imminent or life-threatening deterioration.  Critical care was time spent personally by me on  the following activities: development of  treatment plan with patient and/or surrogate as well as nursing, discussions with consultants, evaluation of patient's response to treatment, examination of patient, obtaining history from patient or surrogate, ordering and performing treatments and interventions, ordering and review of laboratory studies, ordering and review of radiographic studies, pulse oximetry and re-evaluation of patient's condition.   Loralie Champagne 12/25/2019 7:51 AM

## 2020-01-12 ENCOUNTER — Inpatient Hospital Stay (HOSPITAL_COMMUNITY): Payer: No Typology Code available for payment source

## 2020-01-12 DIAGNOSIS — Z95811 Presence of heart assist device: Secondary | ICD-10-CM | POA: Diagnosis not present

## 2020-01-12 DIAGNOSIS — I5021 Acute systolic (congestive) heart failure: Secondary | ICD-10-CM | POA: Diagnosis not present

## 2020-01-12 DIAGNOSIS — J9601 Acute respiratory failure with hypoxia: Secondary | ICD-10-CM | POA: Diagnosis not present

## 2020-01-12 DIAGNOSIS — R570 Cardiogenic shock: Secondary | ICD-10-CM | POA: Diagnosis not present

## 2020-01-12 LAB — PREPARE FRESH FROZEN PLASMA
Unit division: 0
Unit division: 0
Unit division: 0
Unit division: 0
Unit division: 0
Unit division: 0
Unit division: 0
Unit division: 0
Unit division: 0
Unit division: 0
Unit division: 0

## 2020-01-12 LAB — RENAL FUNCTION PANEL
Albumin: 2.2 g/dL — ABNORMAL LOW (ref 3.5–5.0)
Anion gap: 18 — ABNORMAL HIGH (ref 5–15)
BUN: 26 mg/dL — ABNORMAL HIGH (ref 8–23)
CO2: 21 mmol/L — ABNORMAL LOW (ref 22–32)
Calcium: 5.6 mg/dL — CL (ref 8.9–10.3)
Chloride: 97 mmol/L — ABNORMAL LOW (ref 98–111)
Creatinine, Ser: 1.86 mg/dL — ABNORMAL HIGH (ref 0.61–1.24)
GFR, Estimated: 39 mL/min — ABNORMAL LOW (ref >60)
Glucose, Bld: 72 mg/dL (ref 70–99)
Phosphorus: 8.2 mg/dL — ABNORMAL HIGH (ref 2.5–4.6)
Potassium: 5.7 mmol/L — ABNORMAL HIGH (ref 3.5–5.1)
Sodium: 136 mmol/L (ref 135–145)

## 2020-01-12 LAB — BPAM PLATELET PHERESIS
Blood Product Expiration Date: 202110222359
Blood Product Expiration Date: 202110222359
Blood Product Expiration Date: 202110242359
Blood Product Expiration Date: 202110232359
Blood Product Expiration Date: 202110232359
ISSUE DATE / TIME: 202110210751
ISSUE DATE / TIME: 202110210751
ISSUE DATE / TIME: 202110211515
ISSUE DATE / TIME: 202110211358
ISSUE DATE / TIME: 202110211358
Unit Type and Rh: 5100
Unit Type and Rh: 5100
Unit Type and Rh: 6200
Unit Type and Rh: 5100
Unit Type and Rh: 6200

## 2020-01-12 LAB — PREPARE PLATELET PHERESIS
Unit division: 0
Unit division: 0
Unit division: 0
Unit division: 0
Unit division: 0

## 2020-01-12 LAB — CBC WITH DIFFERENTIAL/PLATELET
Abs Immature Granulocytes: 0 10*3/uL (ref 0.00–0.07)
Band Neutrophils: 2 %
Basophils Absolute: 0 10*3/uL (ref 0.0–0.1)
Basophils Relative: 0 %
Eosinophils Absolute: 0.1 10*3/uL (ref 0.0–0.5)
Eosinophils Relative: 1 %
HCT: 36.1 % — ABNORMAL LOW (ref 39.0–52.0)
Hemoglobin: 12.4 g/dL — ABNORMAL LOW (ref 13.0–17.0)
Lymphocytes Relative: 13 %
Lymphs Abs: 1.7 10*3/uL (ref 0.7–4.0)
MCH: 30.2 pg (ref 26.0–34.0)
MCHC: 34.3 g/dL (ref 30.0–36.0)
MCV: 88 fL (ref 80.0–100.0)
Monocytes Absolute: 0.9 10*3/uL (ref 0.1–1.0)
Monocytes Relative: 7 %
Neutro Abs: 10.3 10*3/uL — ABNORMAL HIGH (ref 1.7–7.7)
Neutrophils Relative %: 77 %
Platelets: 72 10*3/uL — ABNORMAL LOW (ref 150–400)
RBC: 4.1 MIL/uL — ABNORMAL LOW (ref 4.22–5.81)
RDW: 14.8 % (ref 11.5–15.5)
WBC: 13 10*3/uL — ABNORMAL HIGH (ref 4.0–10.5)
nRBC: 7 /100 WBC — ABNORMAL HIGH
nRBC: 7.1 % — ABNORMAL HIGH (ref 0.0–0.2)

## 2020-01-12 LAB — GLUCOSE, CAPILLARY
Glucose-Capillary: 105 mg/dL — ABNORMAL HIGH (ref 70–99)
Glucose-Capillary: 74 mg/dL (ref 70–99)
Glucose-Capillary: 76 mg/dL (ref 70–99)
Glucose-Capillary: 84 mg/dL (ref 70–99)
Glucose-Capillary: 85 mg/dL (ref 70–99)
Glucose-Capillary: 91 mg/dL (ref 70–99)
Glucose-Capillary: 91 mg/dL (ref 70–99)

## 2020-01-12 LAB — POTASSIUM
Potassium: 5.2 mmol/L — ABNORMAL HIGH (ref 3.5–5.1)
Potassium: 5.6 mmol/L — ABNORMAL HIGH (ref 3.5–5.1)
Potassium: 5.6 mmol/L — ABNORMAL HIGH (ref 3.5–5.1)
Potassium: 6.8 mmol/L (ref 3.5–5.1)

## 2020-01-12 LAB — POCT I-STAT 7, (LYTES, BLD GAS, ICA,H+H)
Acid-Base Excess: 8 mmol/L — ABNORMAL HIGH (ref 0.0–2.0)
Acid-Base Excess: 1 mmol/L (ref 0.0–2.0)
Bicarbonate: 32.7 mmol/L — ABNORMAL HIGH (ref 20.0–28.0)
Bicarbonate: 26.2 mmol/L (ref 20.0–28.0)
Calcium, Ion: 0.7 mmol/L — CL (ref 1.15–1.40)
Calcium, Ion: 0.71 mmol/L — CL (ref 1.15–1.40)
HCT: 34 % — ABNORMAL LOW (ref 39.0–52.0)
HCT: 43 % (ref 39.0–52.0)
Hemoglobin: 11.6 g/dL — ABNORMAL LOW (ref 13.0–17.0)
Hemoglobin: 14.6 g/dL (ref 13.0–17.0)
O2 Saturation: 97 %
O2 Saturation: 97 %
Patient temperature: 36
Patient temperature: 35.9
Potassium: 5.8 mmol/L — ABNORMAL HIGH (ref 3.5–5.1)
Potassium: 5.5 mmol/L — ABNORMAL HIGH (ref 3.5–5.1)
Sodium: 137 mmol/L (ref 135–145)
Sodium: 135 mmol/L (ref 135–145)
TCO2: 34 mmol/L — ABNORMAL HIGH (ref 22–32)
TCO2: 27 mmol/L (ref 22–32)
pCO2 arterial: 42.4 mmHg (ref 32.0–48.0)
pCO2 arterial: 41.1 mmHg (ref 32.0–48.0)
pH, Arterial: 7.491 — ABNORMAL HIGH (ref 7.350–7.450)
pH, Arterial: 7.407 (ref 7.350–7.450)
pO2, Arterial: 76 mmHg — ABNORMAL LOW (ref 83.0–108.0)
pO2, Arterial: 82 mmHg — ABNORMAL LOW (ref 83.0–108.0)

## 2020-01-12 LAB — PREPARE CRYOPRECIPITATE
Unit division: 0
Unit division: 0
Unit division: 0

## 2020-01-12 LAB — BPAM CRYOPRECIPITATE
Blood Product Expiration Date: 202110211955
Blood Product Expiration Date: 202110211955
Blood Product Expiration Date: 202110212115
ISSUE DATE / TIME: 202110211413
ISSUE DATE / TIME: 202110211413
ISSUE DATE / TIME: 202110211706
Unit Type and Rh: 5100
Unit Type and Rh: 5100
Unit Type and Rh: 5100

## 2020-01-12 LAB — COOXEMETRY PANEL
Carboxyhemoglobin: 0.8 % (ref 0.5–1.5)
Carboxyhemoglobin: 0.5 % (ref 0.5–1.5)
Methemoglobin: 1 % (ref 0.0–1.5)
Methemoglobin: 0.8 % (ref 0.0–1.5)
O2 Saturation: 63.2 %
O2 Saturation: 65.6 %
Total hemoglobin: 13.1 g/dL (ref 12.0–16.0)
Total hemoglobin: 14.7 g/dL (ref 12.0–16.0)

## 2020-01-12 LAB — COMPREHENSIVE METABOLIC PANEL WITH GFR
ALT: 5067 U/L — ABNORMAL HIGH (ref 0–44)
AST: >10000 U/L — ABNORMAL HIGH (ref 15–41)
Albumin: 2.6 g/dL — ABNORMAL LOW (ref 3.5–5.0)
Alkaline Phosphatase: 182 U/L — ABNORMAL HIGH (ref 38–126)
Anion gap: 15 (ref 5–15)
BUN: 36 mg/dL — ABNORMAL HIGH (ref 8–23)
CO2: 26 mmol/L (ref 22–32)
Calcium: 5.8 mg/dL — CL (ref 8.9–10.3)
Chloride: 98 mmol/L (ref 98–111)
Creatinine, Ser: 2.21 mg/dL — ABNORMAL HIGH (ref 0.61–1.24)
GFR, Estimated: 31 mL/min — ABNORMAL LOW
Glucose, Bld: 97 mg/dL (ref 70–99)
Potassium: 6.4 mmol/L (ref 3.5–5.1)
Sodium: 139 mmol/L (ref 135–145)
Total Bilirubin: 6.8 mg/dL — ABNORMAL HIGH (ref 0.3–1.2)
Total Protein: 4.5 g/dL — ABNORMAL LOW (ref 6.5–8.1)

## 2020-01-12 LAB — LACTIC ACID, PLASMA
Lactic Acid, Venous: 4.9 mmol/L (ref 0.5–1.9)
Lactic Acid, Venous: 7.1 mmol/L (ref 0.5–1.9)

## 2020-01-12 LAB — BPAM FFP
Blood Product Expiration Date: 202110212359
Blood Product Expiration Date: 202110212359
Blood Product Expiration Date: 202110212359
Blood Product Expiration Date: 202110212359
Blood Product Expiration Date: 202110212359
Blood Product Expiration Date: 202110212359
Blood Product Expiration Date: 202110212359
Blood Product Expiration Date: 202110212359
Blood Product Expiration Date: 202110212359
Blood Product Expiration Date: 202110232359
Blood Product Expiration Date: 202110232359
Blood Product Expiration Date: 202110232359
Blood Product Expiration Date: 202110232359
Blood Product Expiration Date: 202110232359
Blood Product Expiration Date: 202110232359
Blood Product Expiration Date: 202110232359
Blood Product Expiration Date: 202110262359
ISSUE DATE / TIME: 202110190928
ISSUE DATE / TIME: 202110210751
ISSUE DATE / TIME: 202110210751
ISSUE DATE / TIME: 202110211406
ISSUE DATE / TIME: 202110211406
ISSUE DATE / TIME: 202110211521
ISSUE DATE / TIME: 202110211521
ISSUE DATE / TIME: 202110211711
ISSUE DATE / TIME: 202110211711
ISSUE DATE / TIME: 202110211711
ISSUE DATE / TIME: 202110211711
ISSUE DATE / TIME: 202110211846
ISSUE DATE / TIME: 202110211846
ISSUE DATE / TIME: 202110211946
ISSUE DATE / TIME: 202110211946
ISSUE DATE / TIME: 202110220252
ISSUE DATE / TIME: 202110220252
Unit Type and Rh: 5100
Unit Type and Rh: 5100
Unit Type and Rh: 9500
Unit Type and Rh: 1700
Unit Type and Rh: 5100
Unit Type and Rh: 600
Unit Type and Rh: 600
Unit Type and Rh: 6200
Unit Type and Rh: 6200
Unit Type and Rh: 6200
Unit Type and Rh: 6200
Unit Type and Rh: 6200
Unit Type and Rh: 7300
Unit Type and Rh: 7300
Unit Type and Rh: 7300
Unit Type and Rh: 7300
Unit Type and Rh: 7300

## 2020-01-12 LAB — POCT I-STAT, CHEM 8
BUN: 30 mg/dL — ABNORMAL HIGH (ref 8–23)
Calcium, Ion: 0.71 mmol/L — CL (ref 1.15–1.40)
Chloride: 97 mmol/L — ABNORMAL LOW (ref 98–111)
Creatinine, Ser: 2.2 mg/dL — ABNORMAL HIGH (ref 0.61–1.24)
Glucose, Bld: 62 mg/dL — ABNORMAL LOW (ref 70–99)
HCT: 41 % (ref 39.0–52.0)
Hemoglobin: 13.9 g/dL (ref 13.0–17.0)
Potassium: 5.6 mmol/L — ABNORMAL HIGH (ref 3.5–5.1)
Sodium: 135 mmol/L (ref 135–145)
TCO2: 23 mmol/L (ref 22–32)

## 2020-01-12 LAB — PROTIME-INR
INR: 3.5 — ABNORMAL HIGH (ref 0.8–1.2)
Prothrombin Time: 33.8 seconds — ABNORMAL HIGH (ref 11.4–15.2)

## 2020-01-12 LAB — CBC
HCT: 40.1 % (ref 39.0–52.0)
Hemoglobin: 13.1 g/dL (ref 13.0–17.0)
MCH: 29.9 pg (ref 26.0–34.0)
MCHC: 32.7 g/dL (ref 30.0–36.0)
MCV: 91.6 fL (ref 80.0–100.0)
Platelets: 54 10*3/uL — ABNORMAL LOW (ref 150–400)
RBC: 4.38 MIL/uL (ref 4.22–5.81)
RDW: 15.3 % (ref 11.5–15.5)
WBC: 16.9 10*3/uL — ABNORMAL HIGH (ref 4.0–10.5)
nRBC: 12.1 % — ABNORMAL HIGH (ref 0.0–0.2)

## 2020-01-12 LAB — COMPREHENSIVE METABOLIC PANEL
ALT: 4429 U/L — ABNORMAL HIGH (ref 0–44)
AST: >10000 U/L — ABNORMAL HIGH (ref 15–41)
Albumin: 2.3 g/dL — ABNORMAL LOW (ref 3.5–5.0)
Alkaline Phosphatase: 178 U/L — ABNORMAL HIGH (ref 38–126)
Anion gap: 16 — ABNORMAL HIGH (ref 5–15)
BUN: 30 mg/dL — ABNORMAL HIGH (ref 8–23)
CO2: 26 mmol/L (ref 22–32)
Calcium: 5.4 mg/dL — CL (ref 8.9–10.3)
Chloride: 95 mmol/L — ABNORMAL LOW (ref 98–111)
Creatinine, Ser: 2.02 mg/dL — ABNORMAL HIGH (ref 0.61–1.24)
GFR, Estimated: 35 mL/min — ABNORMAL LOW (ref >60)
Glucose, Bld: 91 mg/dL (ref 70–99)
Potassium: 5.5 mmol/L — ABNORMAL HIGH (ref 3.5–5.1)
Sodium: 137 mmol/L (ref 135–145)
Total Bilirubin: 7 mg/dL — ABNORMAL HIGH (ref 0.3–1.2)
Total Protein: 4.1 g/dL — ABNORMAL LOW (ref 6.5–8.1)

## 2020-01-12 LAB — VANCOMYCIN, RANDOM: Vancomycin Rm: 11

## 2020-01-12 LAB — APTT
aPTT: 40 seconds — ABNORMAL HIGH (ref 24–36)
aPTT: 57 seconds — ABNORMAL HIGH (ref 24–36)
aPTT: 58 seconds — ABNORMAL HIGH (ref 24–36)

## 2020-01-12 LAB — BLOOD PRODUCT ORDER (VERBAL) VERIFICATION

## 2020-01-12 LAB — PHOSPHORUS: Phosphorus: 10.2 mg/dL — ABNORMAL HIGH (ref 2.5–4.6)

## 2020-01-12 LAB — MAGNESIUM: Magnesium: 2.9 mg/dL — ABNORMAL HIGH (ref 1.7–2.4)

## 2020-01-12 LAB — LACTATE DEHYDROGENASE: LDH: >10000 U/L — ABNORMAL HIGH (ref 98–192)

## 2020-01-12 LAB — PROCALCITONIN: Procalcitonin: 28.85 ng/mL

## 2020-01-12 LAB — HEPARIN INDUCED PLATELET AB (HIT ANTIBODY): Heparin Induced Plt Ab: 0.103 OD (ref 0.000–0.400)

## 2020-01-12 MED ORDER — PRISMASOL BGK 0/2.5 32-2.5 MEQ/L IV SOLN
INTRAVENOUS | Status: DC
  Filled 2020-01-12 (×16): qty 5000

## 2020-01-12 MED ORDER — CALCIUM GLUCONATE-NACL 1-0.675 GM/50ML-% IV SOLN
1.0000 g | Freq: Once | INTRAVENOUS | Status: AC
  Administered 2020-01-12: 1000 mg via INTRAVENOUS
  Filled 2020-01-12: qty 50

## 2020-01-12 MED ORDER — PRISMASOL BGK 0/2.5 32-2.5 MEQ/L IV SOLN
INTRAVENOUS | Status: DC
  Filled 2020-01-12 (×5): qty 5000

## 2020-01-12 MED ORDER — DEXTROSE 50 % IV SOLN
12.5000 g | INTRAVENOUS | Status: AC
  Administered 2020-01-12: 12.5 g via INTRAVENOUS
  Filled 2020-01-12: qty 50

## 2020-01-12 MED ORDER — THIAMINE HCL 100 MG PO TABS
100.0000 mg | ORAL_TABLET | Freq: Every day | ORAL | Status: DC
  Administered 2020-01-13: 100 mg
  Filled 2020-01-12: qty 1

## 2020-01-12 MED ORDER — PRISMASOL BGK 0/2.5 32-2.5 MEQ/L IV SOLN
INTRAVENOUS | Status: DC
  Filled 2020-01-12 (×3): qty 5000

## 2020-01-12 MED ORDER — DOCUSATE SODIUM 50 MG/5ML PO LIQD
200.0000 mg | Freq: Every day | ORAL | Status: DC
  Administered 2020-01-13: 200 mg
  Filled 2020-01-12: qty 20

## 2020-01-12 MED ORDER — AMIODARONE HCL IN DEXTROSE 360-4.14 MG/200ML-% IV SOLN
30.0000 mg/h | INTRAVENOUS | Status: DC
  Administered 2020-01-12 – 2020-01-14 (×4): 30 mg/h via INTRAVENOUS
  Filled 2020-01-12 (×4): qty 200

## 2020-01-12 MED ORDER — THIAMINE HCL 100 MG/ML IJ SOLN
100.0000 mg | Freq: Every day | INTRAMUSCULAR | Status: DC
  Filled 2020-01-12: qty 2

## 2020-01-12 MED ORDER — ALBUMIN HUMAN 5 % IV SOLN
INTRAVENOUS | Status: AC
  Filled 2020-01-12: qty 250

## 2020-01-12 MED ORDER — POLYETHYLENE GLYCOL 3350 17 G PO PACK
17.0000 g | PACK | Freq: Every day | ORAL | Status: DC
  Administered 2020-01-13: 17 g
  Filled 2020-01-12: qty 1

## 2020-01-12 MED ORDER — CALCIUM CHLORIDE 10 % IV SOLN
INTRAVENOUS | Status: AC
  Administered 2020-01-12: 1000 mg
  Filled 2020-01-12: qty 10

## 2020-01-12 MED ORDER — VANCOMYCIN HCL 750 MG/150ML IV SOLN
750.0000 mg | INTRAVENOUS | Status: DC
  Administered 2020-01-12 – 2020-01-13 (×2): 750 mg via INTRAVENOUS
  Filled 2020-01-12 (×3): qty 150

## 2020-01-12 NOTE — Plan of Care (Signed)
°  Problem: Education: Goal: Knowledge of General Education information will improve Description: Including pain rating scale, medication(s)/side effects and non-pharmacologic comfort measures Outcome: Progressing   Problem: Health Behavior/Discharge Planning: Goal: Ability to manage health-related needs will improve Outcome: Progressing   Problem: Clinical Measurements: Goal: Ability to maintain clinical measurements within normal limits will improve Outcome: Progressing Goal: Will remain free from infection Outcome: Progressing Goal: Diagnostic test results will improve Outcome: Progressing Goal: Respiratory complications will improve Outcome: Progressing Goal: Cardiovascular complication will be avoided Outcome: Progressing   Problem: Activity: Goal: Risk for activity intolerance will decrease Outcome: Progressing   Problem: Nutrition: Goal: Adequate nutrition will be maintained Outcome: Progressing   Problem: Coping: Goal: Level of anxiety will decrease Outcome: Progressing   Problem: Elimination: Goal: Will not experience complications related to bowel motility Outcome: Progressing Goal: Will not experience complications related to urinary retention Outcome: Progressing   Problem: Pain Managment: Goal: General experience of comfort will improve Outcome: Progressing   Problem: Safety: Goal: Ability to remain free from injury will improve Outcome: Progressing   Problem: Skin Integrity: Goal: Risk for impaired skin integrity will decrease Outcome: Progressing   Problem: Education: Goal: Ability to demonstrate management of disease process will improve Outcome: Progressing Goal: Ability to verbalize understanding of medication therapies will improve Outcome: Progressing Goal: Individualized Educational Video(s) Outcome: Progressing   Problem: Activity: Goal: Capacity to carry out activities will improve Outcome: Progressing   Problem: Cardiac: Goal:  Ability to achieve and maintain adequate cardiopulmonary perfusion will improve Outcome: Progressing   Problem: Education: Goal: Will demonstrate proper wound care and an understanding of methods to prevent future damage Outcome: Progressing Goal: Knowledge of disease or condition will improve Outcome: Progressing Goal: Knowledge of the prescribed therapeutic regimen will improve Outcome: Progressing Goal: Individualized Educational Video(s) Outcome: Progressing   Problem: Activity: Goal: Risk for activity intolerance will decrease Outcome: Progressing   Problem: Cardiac: Goal: Will achieve and/or maintain hemodynamic stability Outcome: Progressing   Problem: Respiratory: Goal: Respiratory status will improve Outcome: Progressing

## 2020-01-12 NOTE — Progress Notes (Signed)
ANTICOAGULATION CONSULT NOTE   Pharmacy Consult for bivalirudin Indication: Impella  Allergies  Allergen Reactions  . Lisinopril Swelling    Angioedema in 2020    Patient Measurements: Height: 5\' 10"  (177.8 cm) Weight: 74.7 kg (164 lb 10.9 oz) IBW/kg (Calculated) : 73 Heparin Dosing Weight: 56kg  Vital Signs: Temp: 96.6 F (35.9 C) (10/22 1300) Temp Source: Core (10/22 1200) BP: 106/92 (10/22 1041) Pulse Rate: 88 (10/22 1300)  Labs: Recent Labs    01/09/20 1738 01/09/20 1743 01/10/20 0426 01/10/20 0446 01/10/20 1622 01/10/20 1644 01/13/2020 1221 12/30/2019 1221 01/13/2020 1240 01/19/2020 1526 12/22/2019 1635 01/10/2020 1638 01/06/2020 2031 12/29/2019 2031 01/12/20 0233 01/12/20 0420 01/12/20 0436 01/12/20 0826 01/12/20 0854 01/12/20 1125  HGB 8.2*   < > 9.2*   < >  --    < > 10.8*  --    < > 8.5*  --    < > 11.9*   < >  --  12.4* 11.6*  --   --   --   HCT 25.2*   < > 27.2*   < >  --    < > 33.7*  --    < > 26.3*  --    < > 35.4*  --   --  36.1* 34.0*  --   --   --   PLT 106*   < > 87*  --   --    < > 47*  --    < > 114*  --   --  104*  --   --  72*  --   --   --   --   APTT 53*   < > 44*  --   --    < > 68*   < >  --   --  61*  --   --   --  40*  --   --   --   --  57*  LABPROT  --   --   --   --   --   --  38.3*  --   --   --   --   --   --   --   --   --   --  33.8*  --   --   INR  --   --   --   --   --   --  4.1*  --   --   --   --   --   --   --   --   --   --  3.5*  --   --   HEPARINUNFRC <0.10*  --  <0.10*  --  <0.10*  --   --   --   --   --   --   --   --   --   --   --   --   --   --   --   CREATININE 1.82*   < > 2.17*  --   --    < > 3.03*   < >   < >  --  2.78*  --   --   --  2.21*  --   --   --  2.02*  --    < > = values in this interval not displayed.    Estimated Creatinine Clearance: 35.6 mL/min (A) (by C-G formula based on SCr of 2.02 mg/dL (H)).   Medical History: Past Medical History:  Diagnosis  Date  . Diverticulosis   . Hyperlipidemia   .  Hypertension   . Internal hemorrhoids   . PVD (peripheral vascular disease) (HCC)     Assessment: 68 yoM admitted with NSTEMI started on IV heparin >s/p CABG with Impella placement. Pharmacy following along anticoagulation.  Patient had complicated post op course with drop in hgb and return to OR 10/21 for washout.  PLTC dropped 200 preop to 30 10/21 now 70. HIT panel in process  D5W purge and bivalirudin running peripheral to decrease risk of impella pump thrombosis.  CT outpt stable, HGB improved 12, impella purge flow 23ml/min purge pressure 400s.   bivalirudin drip resumed this morning 0.02mg /kg/hr with aptt 57sec  aptt slightly elevated at baseline at 40sec last pm after OR     Goal of Therapy:  aptt about 50 sec for today - reevalute daily  with surgeon as patient progresses Monitor platelets by anticoagulation protocol: Yes   Plan:  Continue D5W purge  Decrease bivalirudin 0.015mg /kg/hr  aptt q12 at 5a/5p Monitor bleeding    Leota Sauers Pharm.D. CPP, BCPS Clinical Pharmacist [phone] 01/12/2020 2:14 PM

## 2020-01-12 NOTE — Progress Notes (Signed)
Spoke with Dr. Kathrene Bongo regarding potassium level.  Patient to receive sodium bicarb and calcium as ordered for potassium >7.5.  Received orders to change settings on CRRT and post filter fluid (see orders).  Increased blood flow to 250 as instructed, will see how patient tolerates. Vitals remain stable, purge flow consist at 4.9.  Will continue to monitor.

## 2020-01-12 NOTE — Progress Notes (Addendum)
Patient ID: Adrian Phillips, male   DOB: 02-22-1951, 69 y.o.   MRN: [id number]     Advanced Heart Failure Rounding Note  PCP-Cardiologist: Jenkins Rouge, MD   Subjective:    - 10/18: CABG (LIMA-LAD, SVG-OM1, SVG-dCFx) + Impella 5.5 - 10/21: S/P sternal wound wash out with removal of clot compressing RV. PEA arrest and started CVVHD  On amio 30 mg, vasopressin 0.02 units, milrinone 0.3, norepi 24 mcg, epinephrine 10.   On CVVHD. Remains intubated. Made >300 cc urine.   Lactic acid 10>4   Impella:  P8, good waveforms with no alarms.  Flow 4 L/min CPO 0.9  LDH 236 => 1696 => 4234=>10000   PAP: (23-59)/(10-32) 24/10 CVP:  [3 mmHg-32 mmHg] 4 mmHg PCWP:  [27 mmHg] 27 mmHg CO:  [3.4 L/min-7 L/min] 4.4 L/min CI:  [2 L/min/m2-4.1 L/min/m2] 2.6 L/min/m2   Cardiac MRI:  1. Moderate LVE with diffuse hypokinesis worse in the septum and apex EF 26% 2. Post gadolinium images with sub-endocardial scar in the mid distal anterior wall, septum and apex. No transmural or full thickness scar suggesting possibility of LVEF recovery with revascularization.    Objective:   Weight Range: 74.7 kg Body mass index is 23.63 kg/m.   Vital Signs:   Temp:  [93.6 F (34.2 C)-98.2 F (36.8 C)] 96.1 F (35.6 C) (10/22 0700) Pulse Rate:  [45-91] 90 (10/22 0700) Resp:  [14-23] 20 (10/22 0700) BP: (37-101)/(17-90) 65/53 (10/21 1600) SpO2:  [93 %-100 %] 97 % (10/22 0700) Arterial Line BP: (66-243)/(51-83) 148/69 (10/22 0700) FiO2 (%):  [70 %-100 %] 70 % (10/22 0144) Weight:  [74.7 kg] 74.7 kg (10/22 0500) Last BM Date: 01/03/2020  Weight change: Filed Weights   01/09/20 0618 01/10/20 0630 01/12/20 0500  Weight: 63.6 kg 66 kg 74.7 kg    Intake/Output:   Intake/Output Summary (Last 24 hours) at 01/12/2020 0751 Last data filed at 01/12/2020 0700 Gross per 24 hour  Intake 8495.47 ml  Output 1366 ml  Net 7129.47 ml      Physical Exam  CVP 4 General:  Intubated  HEENT: ETT Neck:  supple. no JVD. Carotids 2+ bilat; no bruits. No lymphadenopathy or thryomegaly appreciated. Cor: PMI nondisplaced. Regular rate & rhythm. No rubs, gallops or murmurs. Direct Impella Sternal dressing.  Lungs: clear Abdomen: soft, nontender, nondistended. No hepatosplenomegaly. No bruits or masses. Good bowel sounds. Extremities: no cyanosis, clubbing, rash, edema Neuro: opens eyes on vent.    Telemetry   A-V paced 89    Labs    CBC Recent Labs    01/18/2020 2031 01/07/2020 2031 01/12/20 0420 01/12/20 0436  WBC 12.7*  --  13.0*  --   NEUTROABS  --   --  10.3*  --   HGB 11.9*   < > 12.4* 11.6*  HCT 35.4*   < > 36.1* 34.0*  MCV 89.6  --  88.0  --   PLT 104*  --  72*  --    < > = values in this interval not displayed.   Basic Metabolic Panel Recent Labs    01/09/20 1738 01/09/20 1743 12/30/2019 1635 01/04/2020 1638 01/12/20 0233 01/12/20 0436  NA 143   < > 142   < > 139 137  K 5.5*   < > >7.5*   < > 6.4* 5.8*  CL 106   < > 103  --  98  --   CO2 19*   < > 18*  --  26  --  GLUCOSE 145*   < > 159*  --  97  --   BUN 22   < > 47*  --  36*  --   CREATININE 1.82*   < > 2.78*  --  2.21*  --   CALCIUM 8.1*   < > 8.3*  --  5.8*  --   MG 2.1  --   --   --  2.9*  --   PHOS  --    < > >30.0*  --  10.2*  --    < > = values in this interval not displayed.   Liver Function Tests Recent Labs    01/07/2020 1221 01/20/2020 1221 12/29/2019 1635 01/12/20 0233  AST 6,642*  --   --  >10,000*  ALT 3,086*  --   --  5,067*  ALKPHOS 121  --   --  182*  BILITOT 3.2*  --   --  6.8*  PROT 3.3*  --   --  4.5*  ALBUMIN 2.0*   < > 2.3* 2.6*   < > = values in this interval not displayed.   No results for input(s): LIPASE, AMYLASE in the last 72 hours. Cardiac Enzymes No results for input(s): CKTOTAL, CKMB, CKMBINDEX, TROPONINI in the last 72 hours.  BNP: BNP (last 3 results) Recent Labs    01/04/2020 0151  BNP 269.4*    ProBNP (last 3 results) No results for input(s): PROBNP in the last  8760 hours.   D-Dimer No results for input(s): DDIMER in the last 72 hours. Hemoglobin A1C No results for input(s): HGBA1C in the last 72 hours. Fasting Lipid Panel No results for input(s): CHOL, HDL, LDLCALC, TRIG, CHOLHDL, LDLDIRECT in the last 72 hours. Thyroid Function Tests No results for input(s): TSH, T4TOTAL, T3FREE, THYROIDAB in the last 72 hours.  Invalid input(s): FREET3  Other results:   Imaging    DG Chest Port 1 View  Result Date: 12/27/2019 CLINICAL DATA:  History of CABG. EXAM: PORTABLE CHEST 1 VIEW COMPARISON:  Prior study same day. FINDINGS: Endotracheal tube, NG tube, right central line, Swan-Ganz catheter, mediastinal drainage catheter bilateral chest tubes ventricular assist device in stable position. Prior CABG. Stable cardiomegaly. Diffuse bilateral pulmonary interstitial prominence again noted without interim change. IMPRESSION: 1. Lines and tubes in stable position. No pneumothorax. 2. Prior CABG. Stable cardiomegaly. 3. Diffuse bilateral interstitial prominence consistent with interstitial edema again noted without interim change. Electronically Signed   By: Marcello Moores  Register   On: 01/20/2020 14:19   DG Chest Port 1 View  Result Date: 01/06/2020 CLINICAL DATA:  Hypoxia EXAM: PORTABLE CHEST 1 VIEW COMPARISON:  January 11, 2020 study obtained earlier in the day FINDINGS: Endotracheal tube tip is 5.5 cm above the carina. Nasogastric tube present with tip and side port below the diaphragm. Impella device present, unchanged. No appreciable change in bilateral chest tube and mediastinal positioning. Swan-Ganz catheter tip is in the right main pulmonary artery. No pneumothorax. There is diffuse interstitial edema, similar to earlier study. No consolidation. Heart upper normal in size. There is aortic atherosclerosis. No adenopathy IMPRESSION: Tube and catheter positions as described without pneumothorax. Generalized interstitial edema present. No consolidation. Stable  cardiac silhouette. Aortic Atherosclerosis (ICD10-I70.0). Electronically Signed   By: Lowella Grip III M.D.   On: 12/22/2019 13:32   DG Chest Port 1 View  Result Date: 12/26/2019 CLINICAL DATA:  Sternal washout and chest tube placement EXAM: PORTABLE CHEST 1 VIEW COMPARISON:  Earlier same day FINDINGS: One right and  two left sided chest tubes are present. Impella device is present. Right IJ Swan-Ganz catheter tip now overlies interlobar pulmonary artery. Endotracheal tube is present. No pneumothorax. Persistent small left pleural effusion. Stable interstitial prominence, which may reflect edema asymmetric to the left. IMPRESSION: Interval repositioning of chest tubes. Right IJ Swan-Ganz catheter tip now overlies interlobar pulmonary artery. Similar interstitial prominence likely reflecting edema. Electronically Signed   By: Macy Mis M.D.   On: 01/12/2020 12:25   ECHOCARDIOGRAM LIMITED  Result Date: 01/17/2020    ECHOCARDIOGRAM LIMITED REPORT   Patient Name:   JORRELL KUSTER Date of Exam: 01/07/2020 Medical Rec #:  [id number]             Height:       70.0 in Accession #:    [id number]            Weight:       145.5 lb Date of Birth:  12/18/1950             BSA:          1.823 m Patient Age:    71 years              BP:           44/25 mmHg Patient Gender: M                     HR:           79 bpm. Exam Location:  Inpatient Procedure: Limited Echo Indications:    Cardiac arrest I46.9  History:        Patient has prior history of Echocardiogram examinations, most                 recent 12/24/2019. Acute MI and CAD, Prior CABG, PAD; Risk                 Factors:Hypertension, Dyslipidemia and Current Smoker. Impella                 positioning after cardiac arrest.  Sonographer:    Darlina Sicilian RDCS Referring Phys: [place]  1. Limited echocardogram for Impella positioning. Aortic valve to Impella tip distance 5.5 cm. Ena Dawley MD Electronically signed by  Ena Dawley MD Signature Date/Time: 12/27/2019/9:02:50 PM    Final      Medications:     Scheduled Medications: . sodium chloride   Intravenous Once  . sodium chloride   Intravenous Once  . sodium chloride   Intravenous Once  . sodium chloride   Intravenous Once  . acetaminophen  1,000 mg Per Tube Q6H   Or  . acetaminophen (TYLENOL) oral liquid 160 mg/5 mL  1,000 mg Per Tube Q6H  . aspirin EC  325 mg Oral Daily   Or  . aspirin  324 mg Per Tube Daily  . bisacodyl  10 mg Oral Daily   Or  . bisacodyl  10 mg Rectal Daily  . chlorhexidine gluconate (MEDLINE KIT)  15 mL Mouth Rinse BID  . Chlorhexidine Gluconate Cloth  6 each Topical Daily  . docusate sodium  200 mg Oral Daily  . fentaNYL (SUBLIMAZE) injection  25 mcg Intravenous Once  . hydrocortisone sod succinate (SOLU-CORTEF) inj  50 mg Intravenous Q6H  . influenza vaccine adjuvanted  0.5 mL Intramuscular Tomorrow-1000  . insulin aspart  0-24 Units Subcutaneous Q4H  . ipratropium-albuterol  3 mL Nebulization Q6H  . mouth rinse  15 mL  Mouth Rinse 10 times per day  . polyethylene glycol  17 g Oral Daily  . sodium chloride flush  10-40 mL Intracatheter Q12H  . sodium chloride flush  3 mL Intravenous Q12H  . sodium zirconium cyclosilicate  10 g Per Tube TID  . thiamine  100 mg Oral Daily   Or  . thiamine  100 mg Intravenous Daily    Infusions: . sodium chloride 20 mL/hr at 01/12/20 0700  . sodium chloride    . sodium chloride 20 mL/hr at 01/06/2020 0759  . sodium chloride 20 mL/hr at 01/12/20 0700  . sodium chloride    . amiodarone 30 mg/hr (01/12/20 0700)  . anticoagulant sodium citrate    . bivalirudin (ANGIOMAX) infusion 0.5 mg/mL (Non-ACS indications)    . ceFEPime (MAXIPIME) IV Stopped (01/12/20 0429)  . dextrose 5 % Impella 5.0 Purge solution    . epinephrine 10 mcg/min (01/12/20 0739)  . famotidine (PEPCID) IV    . fentaNYL infusion INTRAVENOUS 100 mcg/hr (01/12/20 0700)  . milrinone 0.3 mcg/kg/min (01/12/20  0700)  . norepinephrine (LEVOPHED) Adult infusion 28 mcg/min (01/12/20 0700)  . prismasol BGK 0/2.5 500 mL/hr at 01/12/20 0056  . prismasol BGK 0/2.5 2,000 mL/hr at 01/12/20 0738  . sodium bicarbonate (isotonic) 1000 mL infusion 500 mL/hr at 01/12/20 0620  . vancomycin    . vasopressin 0.02 Units/min (01/12/20 0700)    PRN Medications: sodium chloride, Place/Maintain arterial line **AND** sodium chloride, anticoagulant sodium citrate, fentaNYL, midazolam, midazolam, ondansetron (ZOFRAN) IV, sodium chloride, sodium chloride flush, sodium chloride flush     Assessment/Plan   1. CAD: NSTEMI with cath showing totally occluded RCA with collaterals from LAD, 95% proximal LAD, 80% OM1 and 80% OM2.  Suspect he would be best-served by CABG.  Cardiac MRI showed significant viability, would expect to see improvement in LV function with revascularization. Now s/p CABG with LIMA-LAD, SVG-OM1, SVG-dCFX.  - Continue ASA 325 daily.  - Continue atorvastatin 80 mg daily.  2. Acute systolic CHF/post-op cardiogenic shock: Ischemic cardiomyopathy.  Echo this admission with EF < 20%, normal RV.  RHC showed CI mildly decreased at 1.93 with mildly elevated PCWP and normal RA pressure.  Pre-op cardiac MRI shows significant viability, would expect to see improved LV function with revascularization.  Now post-CABG with Impella 5.5 in place functioning normally, good position on echo yesterday. 10/21 to OR for chest washout, there was hematoma compressing RV.  - S/P Sternal wash out. Remains on vasopressin + norepi + milrinone + epinephrine.     - Continue Impella P8 with good flow.  - Continue milrinone + pressors, titrate as able.    - On CVVHD.  3. NSVT:  Amiodarone gtt.   4. COPD: Active smoker with significant COPD on CT.  He is on hydrocortisone 50 q6 hrs.  5. PAD: h/o right fem-pop bypass.  6. Carotid stenosis: Asymptomatic 60-79% RICA stenosis.  7. ID: Component of septic shock. PCT 29.  - Cultures sent.    - On cefepime + vancomycin.  8. Anemia: Post-op blood loss. Transfuse Hgb < 8.  - Stable today. 9. Acute hypoxemic respiratory failure: Intubated. No definite PNA.  - CCM following.  10. AKI: Started CVVH 10/21 starting to pull today.  11. PEA Arrest 10/21 post-OR for chest washout.  12. Hyperkalemia On CVVHD. 13. Elevated LFTs: Marked elevation, suspect shock liver.  14. Lactic Acidosis 10>4 today.  15. Thrombocytopenia: Suspect inflammatory response.    Amy Clegg NP-C  01/12/2020 7:51 AM  Patient seen with NP, agree with the above note.   CVVH currently running even, still hyperkalemic on last BMET (sent again recently).  CVP 10-11 on my read with CI 2.3 and co-ox 63%.  Lactate trending down.  A-V sequential pacing on amiodarone gtt. Bivalirudin restarted this morning. Impella stable at P8 good flow, no alarms.   General: NAD, sedated on vent.  Neck: JVP 10 cm, no thyromegaly or thyroid nodule.  Lungs: Decreased at bases.  CV: Nondisplaced PMI.  Heart regular S1/S2, no S3/S4, no murmur.  No peripheral edema.   Abdomen: Soft, nontender, no hepatosplenomegaly, no distention.  Skin: Intact without lesions or rashes.  Neurologic: Will wake up, try to follow commands per nursing.  Extremities: No clubbing or cyanosis.  HEENT: Normal.   Continue pressors, per Dr. Prescott Gum maintaining MAP around 80 for renal perfusion.  Wean pressors slowly as tolerated. CO stable today off Swan, co-ox 63%.   No change to Impella, stable with no alarms and good flow.  Repositioned yesterday.  Markedly elevated LDH is likely inflammatory and not due to hemolysis. Bivalirudin started today for Impella anticoagulation (plts dropping).   CVP 10-11, start pulling gently via CVVH net 50 cc/hr today.  Continue to work on [employer], repeat BMET sent.  Hope for renal recovery, still making some urine.  Maintain MAP and CO.   PCT 29, covering for infectious etiologies with  vancomycin/cefepime.  Falling plts, 72K today.  Suspect due to inflammatory response.   Shock liver with elevated LFTs, follow.   CRITICAL CARE Performed by: Loralie Champagne  Total critical care time: 40 minutes  Critical care time was exclusive of separately billable procedures and treating other patients.  Critical care was necessary to treat or prevent imminent or life-threatening deterioration.  Critical care was time spent personally by me on the following activities: development of treatment plan with patient and/or surrogate as well as nursing, discussions with consultants, evaluation of patient's response to treatment, examination of patient, obtaining history from patient or surrogate, ordering and performing treatments and interventions, ordering and review of laboratory studies, ordering and review of radiographic studies, pulse oximetry and re-evaluation of patient's condition.  Loralie Champagne 01/12/2020 9:19 AM

## 2020-01-12 NOTE — Progress Notes (Signed)
CRITICAL VALUE ALERT  Critical Value:  Calcium  Date & Time Notied:  01/12/20 0920   Provider Notified: Donata Clay MD  Orders Received/Actions taken: see new orders   Malva Limes RN

## 2020-01-12 NOTE — Progress Notes (Signed)
ANTICOAGULATION CONSULT NOTE   Pharmacy Consult for bivalirudin Indication: Impella  Allergies  Allergen Reactions  . Lisinopril Swelling    Angioedema in 2020    Patient Measurements: Height: 5\' 10"  (177.8 cm) Weight: 74.7 kg (164 lb 10.9 oz) IBW/kg (Calculated) : 73 Heparin Dosing Weight: 56kg  Vital Signs: Temp: 96.4 F (35.8 C) (10/22 1700) Temp Source: Core (10/22 1200) BP: 83/65 (10/22 1500) Pulse Rate: 87 (10/22 1700)  Labs: Recent Labs    01/10/20 0426 01/10/20 0446 01/10/20 1622 01/10/20 1644 01/10/2020 1221 01/21/2020 1240 01/19/2020 1526 01/10/2020 1635 01/15/2020 2031 01/03/2020 2031 01/12/20 0233 01/12/20 0420 01/12/20 0436 01/12/20 0826 01/12/20 0854 01/12/20 1125 01/12/20 1628  HGB 9.2*   < >  --    < > 10.8*   < > 8.5*   < > 11.9*   < >  --  12.4* 11.6*  --   --   --   --   HCT 27.2*   < >  --    < > 33.7*   < > 26.3*   < > 35.4*  --   --  36.1* 34.0*  --   --   --   --   PLT 87*  --   --    < > 47*   < > 114*  --  104*  --   --  72*  --   --   --   --   --   APTT 44*  --   --    < > 68*  --   --    < >  --   --  40*  --   --   --   --  57* 58*  LABPROT  --   --   --   --  38.3*  --   --   --   --   --   --   --   --  33.8*  --   --   --   INR  --   --   --   --  4.1*  --   --   --   --   --   --   --   --  3.5*  --   --   --   HEPARINUNFRC <0.10*  --  <0.10*  --   --   --   --   --   --   --   --   --   --   --   --   --   --   CREATININE 2.17*  --   --    < > 3.03*   < >  --    < >  --   --  2.21*  --   --   --  2.02*  --  1.86*   < > = values in this interval not displayed.    Estimated Creatinine Clearance: 38.7 mL/min (A) (by C-G formula based on SCr of 1.86 mg/dL (H)).   Medical History: Past Medical History:  Diagnosis Date  . Diverticulosis   . Hyperlipidemia   . Hypertension   . Internal hemorrhoids   . PVD (peripheral vascular disease) (HCC)     Assessment: 17 yoM admitted with NSTEMI started on IV heparin >s/p CABG with Impella  placement. Pharmacy following along anticoagulation.  Patient had complicated post op course with drop in hgb and return to OR 10/21 for washout.  PLTC dropped 200  preop to 30 10/21 now 70. HIT panel in process  D5W purge and bivalirudin running peripheral to decrease risk of impella pump thrombosis.  CT outpt stable, HGB improved 12, impella purge flow 68ml/min purge pressure 400s.   bivalirudin drip at 0.015mg /kg/hr with aptt 58sec  aptt slightly elevated at baseline at 40sec last pm after OR     Goal of Therapy:  aptt about 50 sec for today - reevalute daily  with surgeon as patient progresses Monitor platelets by anticoagulation protocol: Yes   Plan:  Continue D5W purge  Decrease bivalirudin 0.01 mg/kg/hr  aptt q12 at 5a/5p Monitor bleeding    Jeanella Cara, PharmD, [employer] [facility] Clinical Pharmacist Please see AMION for all Pharmacists' Contact Phone Numbers 01/12/2020, 6:18 PM

## 2020-01-12 NOTE — Progress Notes (Addendum)
Initial Nutrition Assessment  DOCUMENTATION CODES:   Not applicable, Morbid obesity  INTERVENTION:   Recommend begin TF via OG tube: Vital 1.5 at 45 ml/h (1080 ml per day) Prosource TF 90 ml QID  Provides 1940 kcal, 161 gm protein, 825 ml free water daily  NUTRITION DIAGNOSIS:   Inadequate oral intake related to inability to eat as evidenced by NPO status.  GOAL:   Patient will meet greater than or equal to 90% of their needs  MONITOR:   Vent status, Skin, Labs, I & O's  REASON FOR ASSESSMENT:   Ventilator    ASSESSMENT:   69 yo male admitted with NSTEMI, HF exacerbation. PMH includes emphysema, CAD, smoker, HTN, HLD, PAD, carotid stenosis.   10/18 - S/P CABG and Impella. Developed L hemothorax, chest tube placed. 10/21 - S/P mediastinal reexploration, found significant clot on anterior mediastinal drain. Brief PEA arrest d/t hyperkalemia. CRRT emergently started. Remained on vent support post-op.  Patient is currently intubated on ventilator support, remains on CRRT. MV: 11.8 L/min Temp (24hrs), Avg:96.7 F (35.9 C), Min:93.6 F (34.2 C), Max:98.2 F (36.8 C) MAP (a-line) range: 80-87 this morning   Labs reviewed. K 5.5 (trending down), BUN 30, Creat 2.02, ionized Ca 0.7, phos 10.2 CBG: 105-91-91  Medications reviewed and include epinephrine, vasopressin, levophed, colace, solucortef, novolog, miralax, thiamine.  I/O +3.2 L since admission UOP 330 ml x 24 h OG output: 250 ml x 24 h Chest tube output 790 ml x 24 h  NUTRITION - FOCUSED PHYSICAL EXAM:  unable to complete  Diet Order:   Diet Order            Diet NPO time specified  Diet effective now                 EDUCATION NEEDS:   Not appropriate for education at this time  Skin:  Skin Assessment: Skin Integrity Issues: Skin Integrity Issues:: Stage I, Incisions Stage I: sacrum Incisions: chest and leg  Last BM:  10/18  Height:   Ht Readings from Last 1 Encounters:  01/10/2020 5\' 10"   (1.778 m)    Weight:   Wt Readings from Last 1 Encounters:  01/12/20 74.7 kg    Ideal Body Weight:  75.5 kg  BMI:  Body mass index is 23.63 kg/m.  Estimated Nutritional Needs:   Kcal:  1800  Protein:  150-175 gm  Fluid:  1.8 L    01/02/2020, RD, LDN, CNSC Please refer to Amion for contact information.

## 2020-01-12 NOTE — Progress Notes (Addendum)
NAME:  Adrian Phillips, MRN:  [id number], DOB:  1950-04-13, LOS: 11 ADMISSION DATE:  12/30/2019, CONSULTATION DATE:  01/10/20 REFERRING MD:  Marca Ancona, MD CHIEF COMPLAINT:  Acute hypoxemic respiratory failure  Brief History   69 year old male with emphysema and CAD admitted for NSTEMI and heart failure. Underwent CABG and Impella placement complicated by acute blood loss anemia secondary to left hemothorax s/p chest tube. PCCM consulted for hypoxemia  10/21 Underwent mediastinal reexploration with Dr. Morton Peters with significant clot seen on anterior mediastinal drain with brief PEA arrest after felt secondary to significant hyperkalemia with K 7.5 Received ACLS protocol with ROSC. Marland Kitchen Emergently started on CRRT  History of present illness   69 year old male active smoker who presented with shortness of breath and lower extremity edema and found with NSTEMI. Admitted for acute heart failure exacerbation/cardiogenic shock and NSTEMI. Cardiac cath showed occluded RCA, 95% pLAD, 80% OM1 and 80% OM2. Echo showed EF <20%. Underwent CABG and Impella placement complicated by acute blood loss anemia secondary to left hemothorax s/p chest tube.Underwent CABG and required Impella placement on 10/18. Extubated on 10/19. Despite diuresis and hemothorax improving, patient with persistent hypoxemia, PCCM consulted.  Patient and daughter provides history. He is an active smoker since 69 years old, smoking at least 1/2ppd. He works full time and denies any limitation in activity. At baseline he reports chronic cough with occasional sputum production and shortness of breath with heavy exertion. Occasionally wheezes. He does not use any inhalers at home. Has never been told he has COPD. He has never needed oxygen at home. He currently has fatigue, shortness of breath and wheezing.  Past Medical History  Emphysema CAD s/p CABG HTN HLD NSVT PAD s/p right fem-pop bypass Carotid stenosis  Significant  Hospital Events   10/18 CABG and Impellaplacement 10/21 Rexploration of mediastinum   Consults:  PCCM Card  Procedures:  10/12 LHC 10/18 CABG, Impella  Significant Diagnostic Tests:  CT Chest 01/04/20 - Moderate paraseptal and centrilobular emphysema  CXR 01/10/20 - Pulmonary edema and small bilateral pleural effusions, LVAD, Swan-Ganz, PICC, s/pt left chest tube x 2  Micro Data:  COVID 10/11 > Negative  MRSA PCR 10/11 > Negative  Blood culture 10/21 >  Antimicrobials:  Cefepime 10/21 > Vancomycin 10/21 >   Interim history/subjective:  Sedated on vent  No acute issues overnight   Objective   Blood pressure (!) 65/53, pulse 90, temperature (!) 96.1 F (35.6 C), resp. rate 20, height 5\' 10"  (1.778 m), weight 74.7 kg, SpO2 97 %. PAP: (23-59)/(10-32) 24/10 CVP:  [3 mmHg-32 mmHg] 4 mmHg PCWP:  [27 mmHg] 27 mmHg CO:  [3.4 L/min-7 L/min] 4.4 L/min CI:  [2 L/min/m2-4.1 L/min/m2] 2.6 L/min/m2  Vent Mode: PRVC FiO2 (%):  [70 %-100 %] 70 % Set Rate:  [20 bmp] 20 bmp Vt Set:  [580 mL] 580 mL PEEP:  [5 cmH20-8 cmH20] 8 cmH20 Plateau Pressure:  [26 cmH20-31 cmH20] 30 cmH20   Intake/Output Summary (Last 24 hours) at 01/12/2020 0745 Last data filed at 01/12/2020 0700 Gross per 24 hour  Intake 8495.47 ml  Output 1366 ml  Net 7129.47 ml   Filed Weights   01/09/20 0618 01/10/20 0630 01/12/20 0500  Weight: 63.6 kg 66 kg 74.7 kg    Physical Exam: General: Chronically ill appearing elderly male lying in bed on mechanical ventilation, in NAD HEENT: ETT, MM pink/moist, PERRL,  Neuro: Sedated on vent  CV: s1s2 regular rate and rhythm, no  murmur, rubs, or gallops,  PULM:  Clear to ascultation, tolerating vent well, no increased work of breathing  GI: soft, bowel sounds active in all 4 quadrants, non-tender, non-distended, tolerating TF Extremities: warm/dry, no edema  Skin: no rashes or lesions  Resolved Hospital Problem list     Assessment & Plan:   Acute hypoxemic  respiratory failure secondary COPD exacerbation and pulm edema -Moderate paraseptal and centrilobular emphysema with superimposed pulmonary edema on chest imaging. ABG reviewed. CVP 4-5. -Intubated perioperative and remained on vent post op   P: Continue ventilator support with lung protective strategies  Wean PEEP and FiO2 for sats greater than 90%. Head of bed elevated 30 degrees. Plateau pressures less than 30 cm H20.  Follow intermittent chest x-ray and ABG.   SAT/SBT as tolerated, mentation preclude extubation  Ensure adequate pulmonary hygiene  Follow cultures  VAP bundle in place  PAD protocol Continue steroids Continue bronchodilators PRN  Cardiogenic shock secondary to NSTEMI  -S/p 3v CABG  Acute systolic CHF/postop cardiogenic shock  -ECHO with EF 20% Postoperative tamponade status post reexploration 10/21 NSVT P: Heart failure and cardiothoracic surgery following Continue inotropic support with milrinone Impella remains in place Continue vasopressor support with epinephrine, Levophed, and vasopressin Follow co-ox  Status post multiple transfusions follow coagulopathy closely Continue amiodarone drip Optimize potassium and magnesium Continue IV heparin  PEA arrest -Likely secondary to severe hyperkalemia. -Postoperatively patient experienced a brief PEA arrest, received several rounds of epinephrine, calcium chloride, bicarb, insulin/dextrose.  He also received Lokelma and was immediately started on CRRT P: Continue treatment for cardiogenic shock as above Continue CRRT as below Closely monitor electrolytes and supplement as needed  AKI with oliguria -Patient has no renal dysfunction at baseline, AKI in the setting of CABG and Impella placement with prolonged hypotension likely ATN Hyperkalemia -Potassium peaked at 7.5 resulting in short PEA arrest P: Nephrology following, appreciate assistance,  CRRT per nephrology Follow potassium levels closely Avoid  nephrotoxins Ensure adequate renal perfusion  ABLA secondary to left hemothorax  -Has received 5Plt, 7FFP, 12PRBC, 4cryo Thrombocytopenia P: No further blood products overnight Hemoglobin remained stable Continue to monitor H&H  Severe transaminitis -Shock liver secondary to hypovolemia and cardiogenic shock P: AST greater than 10,000, ALT 5000, alkaline phosphatase 1 8210/22 Management of cardiogenic shock as above Follow liver panel  Avoid hepatotoxins  Tobacco abuse P: Cessation education will be provided once appropriate  Best practice:  Diet: Per primary Pain/Anxiety/Delirium protocol (if indicated): Per primary VAP protocol (if indicated): NA DVT prophylaxis: Heparin GI prophylaxis: Per primary Glucose control: Per primary Mobility: As tolerated Code Status: Full Family Communication: Per primary  Disposition: Per primary  Labs   CBC: Recent Labs  Lab 01/13/2020 0358 12/22/2019 0407 01/10/2020 1221 12/22/2019 1240 01/21/2020 1526 01/20/2020 1526 01/05/2020 1638 01/12/2020 1851 01/06/2020 2031 01/12/20 0420 01/12/20 0436  WBC 18.8*  --  11.5*  --  10.3  --   --   --  12.7* 13.0*  --   NEUTROABS  --   --   --   --   --   --   --   --   --  10.3*  --   HGB 7.8*   < > 10.8*   < > 8.5*   < > 8.8* 10.2* 11.9* 12.4* 11.6*  HCT 23.8*   < > 33.7*   < > 26.3*   < > 26.0* 30.0* 35.4* 36.1* 34.0*  MCV 93.7  --  93.1  --  94.6  --   --   --  89.6 88.0  --   PLT 33*  --  47*  --  114*  --   --   --  104* 72*  --    < > = values in this interval not displayed.    Basic Metabolic Panel: Recent Labs  Lab 01/10/2020 2122 01/17/2020 2127 01/09/20 0502 01/09/20 0607 01/09/20 1738 01/09/20 1743 01/10/20 1644 01/10/20 2238 01/16/2020 0358 12/23/2019 0407 12/22/2019 0932 12/30/2019 1035 01/04/2020 1221 01/20/2020 1240 12/26/2019 1250 01/13/2020 1502 01/12/2020 1635 01/17/2020 1635 01/10/2020 1638 01/16/2020 1800 01/03/2020 1851 01/09/2020 2032 01/12/20 0007 01/12/20 0233 01/12/20 0436  NA 139   <  > 138   < > 143   < > 140   < > 143   < > 142   < > 141   < > 138   < > 142  --  139  --  138  --   --  139 137  K 5.1   < > 5.3*   < > 5.5*   < > 4.8   < > 5.2*   < > 5.8*   < > 7.3*   < > 7.5*   < > >7.5*   < > 7.9*   < > 7.9* >7.5* 6.8* 6.4* 5.8*  CL 111   < > 110   < > 106   < > 106  --  108   < > 105  --  105  --  101  --  103  --   --   --   --   --   --  98  --   CO2 21*   < > 18*   < > 19*   < > 18*  --  17*  --   --   --  18*  --   --   --  18*  --   --   --   --   --   --  26  --   GLUCOSE 155*   < > 130*   < > 145*   < > 133*  --  68*   < > 68*  --  114*  --  114*  --  159*  --   --   --   --   --   --  97  --   BUN 14   < > 14   < > 22   < > 40*  --  49*   < > 51*  --  51*  --  54*  --  47*  --   --   --   --   --   --  36*  --   CREATININE 1.02   < > 1.15   < > 1.82*   < > 2.54*  --  2.83*   < > 3.40*  --  3.03*  --  3.80*  --  2.78*  --   --   --   --   --   --  2.21*  --   CALCIUM 8.5*   < > 8.1*   < > 8.1*   < > 7.7*  --  7.4*  --   --   --  6.0*  --   --   --  8.3*  --   --   --   --   --   --  5.8*  --   MG 2.6*  --  2.1  --  2.1  --   --   --   --   --   --   --   --   --   --   --   --   --   --   --   --   --   --  2.9*  --   PHOS  --   --   --   --   --   --  6.1*  --   --   --   --   --   --   --   --   --  >30.0*  --   --   --   --   --   --  10.2*  --    < > = values in this interval not displayed.   GFR: Estimated Creatinine Clearance: 32.6 mL/min (A) (by C-G formula based on SCr of 2.21 mg/dL (H)). Recent Labs  Lab 01/10/20 0426 01/10/20 0754 01/10/20 1625 01/10/20 1644 01/10/2020 0358 01/20/2020 0410 01/12/2020 8016 01/17/2020 1221 01/21/2020 1526 01/20/2020 1622 01/19/2020 2031 01/12/20 0233 01/12/20 0420  PROCALCITON 43.79  --   --   --   --   --  29.26  --   --   --   --  28.85  --   WBC 17.9*  --   --    < >   < >  --   --  11.5* 10.3  --  12.7*  --  13.0*  LATICACIDVEN  --    < > 8.5*  --   --  10.1*  --   --   --  10.9*  --  4.9*  --    < > = values in this  interval not displayed.    Liver Function Tests: Recent Labs  Lab 01/10/20 1644 01/05/2020 0358 01/04/2020 1221 12/31/2019 1635 01/12/20 0233  AST 3,030* 5,615* 6,642*  --  >10,000*  ALT 2,101* 3,466* 3,086*  --  5,067*  ALKPHOS 53 66 121  --  182*  BILITOT 0.8 2.7* 3.2*  --  6.8*  PROT 4.2* 4.0* 3.3*  --  4.5*  ALBUMIN 2.5* 2.4* 2.0* 2.3* 2.6*   No results for input(s): LIPASE, AMYLASE in the last 168 hours. No results for input(s): AMMONIA in the last 168 hours.  ABG    Component Value Date/Time   PHART 7.491 (H) 01/12/2020 0436   PCO2ART 42.4 01/12/2020 0436   PO2ART 76 (L) 01/12/2020 0436   HCO3 32.7 (H) 01/12/2020 0436   TCO2 34 (H) 01/12/2020 0436   ACIDBASEDEF 3.0 (H) 01/13/2020 1851   O2SAT 97.0 01/12/2020 0436     Coagulation Profile: Recent Labs  Lab 12/23/2019 1502 01/13/2020 2211 12/27/2019 1221  INR 1.5* 1.4* 4.1*    Cardiac Enzymes: Recent Labs  Lab 01/05/20 2144  CKTOTAL 40*    HbA1C: Hgb A1c MFr Bld  Date/Time Value Ref Range Status  01/17/2020 05:53 AM 5.4 4.8 - 5.6 % Final    Comment:    (NOTE) Pre diabetes:          5.7%-6.4%  Diabetes:              >6.4%  Glycemic control for   <7.0% adults with diabetes     CBG: Recent Labs  Lab 12/27/2019 1226 01/06/2020 1649 12/29/2019 2016 01/12/20 0012 01/12/20 0435  GLUCAP 109* 143* 162* 105* 91    Critical care time:    Performed by: Delfin Gant  Total critical care time: 38 minutes  Critical care time was exclusive of separately billable procedures and treating other patients.  Critical care was necessary to treat or prevent imminent or life-threatening deterioration.  Critical care was time spent personally by me on the following activities: development of treatment plan with patient and/or surrogate as well as nursing, discussions with consultants, evaluation of patient's response to treatment, examination of patient, obtaining history from patient or surrogate, ordering and  performing treatments and interventions, ordering and review of laboratory studies, ordering and review of radiographic studies, pulse oximetry and re-evaluation of patient's condition.  Delfin Gant, NP-C [facility] Contact / Pager information can be found on Amion  01/12/2020, 7:45 AM   Pulmonary critical care attending:  This is a 69 year old male, emphysema, CAD, NSTEMI, heart failure.  Underwent CABG ultimately complicated by acute blood loss anemia from left hemothorax.  Supported postoperatively with Impella.  Patient underwent on 01/13/2020 mediastinal exploration with clot removal from the anterior mediastinum, suffered brief PEA cardiac arrest following.  Found to be hyperkalemic with a K of 7.5.  Patient was started on CVVHD yesterday.  At this point remains critically ill on mechanical life support, CVVHD and Impella P8.  Cardiac catheterization showed an occluded RCA with 95% LAD occlusion, 80% M1, 80% OM 2.  With a depressed ejection fraction of less than 20%.  At baseline.  BP (!) 106/92   Pulse 88   Temp (!) 97 F (36.1 C)   Resp 20   Ht  (1.778 m)   Wt 74.7 kg   SpO2 97%   BMI 23.63 kg/m   General: Critically ill intubated on mechanical life support Heart: Regular rhythm S1-S2 Lungs: Bilateral mechanically ventilated breath sounds Extremities: Trace dependent edema.  Neuro: Sedated on mechanical support.  Labs reviewed Chest x-ray: Bilateral vascular congestion, trace left pleural effusion.  Assessment: Acute hypoxemic respiratory failure requiring intubation and mechanical ventilation History of COPD, Pulmonary edema Cardiogenic shock secondary to NSTEMI Acute systolic heart failure, postop cardiogenic shock secondary to above Postoperative tamponade status post hematoma removal on 1021 Cardiac arrest, PEA, presume related to hyperkalemia Acute renal failure requiring CVVHD secondary to above. Acute blood loss anemia left  hemothorax, chest tube in place.  Plan: Patient remains on full mechanical life support critically ill Continue CVVHD per nephrology Impella device management per advanced heart failure service. Vasopressors to maintain mean arterial pressure greater than 65 mmHg. Continue to wean PEEP and FiO2 as tolerated to maintain sats greater than 90%. Continue to observe daily labs, AST ALT improving.  This patient is critically ill with multiple organ system failure; which, requires frequent high complexity decision making, assessment, support, evaluation, and titration of therapies. This was completed through the application of advanced monitoring technologies and extensive interpretation of multiple databases. During this encounter critical care time was devoted to patient care services described in this note for 34 minutes.  Josephine Igo, DO [facility] 01/12/2020 2:29 PM

## 2020-01-12 NOTE — Progress Notes (Signed)
CRITICAL VALUE ALERT  Critical Value:  Potassium 6.8 Date & Time Notied:  01/12/2020@0047  Provider Notified: Dr. Ruben Reason Orders Received/Actions taken: No new orders received at this time.  Spoke with Dr. Kathrene Bongo, relayed lab value.  Monitoring continues, no changes initiated at this time.

## 2020-01-12 NOTE — Progress Notes (Signed)
Patient ID: FIN HUPP, male   DOB: 01/06/1951, 69 y.o.   MRN: [id number] [facility] Evening Rounds:  Labile hemodynamics but PAP and CVP low. Milrinone 0.3, epi 10, NE 22, Vaso 0.02  Impella at P8.   Co-ox 66 this pm.  On CRRT with goal -50/hr but with low filling pressure and BP lability may need to keep even.  Ionized Ca2+ 0.7. Replacing. Lactic acid 7.1  CBC    Component Value Date/Time   WBC 13.0 (H) 01/12/2020 0420   RBC 4.10 (L) 01/12/2020 0420   HGB 11.6 (L) 01/12/2020 0436   HCT 34.0 (L) 01/12/2020 0436   PLT 72 (L) 01/12/2020 0420   MCV 88.0 01/12/2020 0420   MCH 30.2 01/12/2020 0420   MCHC 34.3 01/12/2020 0420   RDW 14.8 01/12/2020 0420   LYMPHSABS 1.7 01/12/2020 0420   MONOABS 0.9 01/12/2020 0420   EOSABS 0.1 01/12/2020 0420   BASOSABS 0.0 01/12/2020 0420   BMET    Component Value Date/Time   NA 136 01/12/2020 1628   K 5.7 (H) 01/12/2020 1628   CL 97 (L) 01/12/2020 1628   CO2 21 (L) 01/12/2020 1628   GLUCOSE 72 01/12/2020 1628   BUN 26 (H) 01/12/2020 1628   CREATININE 1.86 (H) 01/12/2020 1628   CALCIUM 5.6 (LL) 01/12/2020 1628   GFRNONAA 39 (L) 01/12/2020 1628   GFRAA >60 12/05/2019 1719

## 2020-01-12 NOTE — Progress Notes (Signed)
CRITICAL VALUE ALERT  Critical Value:  Calcium   Date & Time Notied:  01/12/20 1725   Provider Notified: Laneta Simmers MD   Orders Received/Actions taken: draw for ionized calcium   Malva Limes RN

## 2020-01-12 NOTE — Progress Notes (Signed)
Adrian Phillips Progress Note    Assessment/ Plan:   1. 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  Management per primary service 2. Acute systolic CHF/cardiogenic shock on epi, levo, and vasopressin.  Also on milrinone.  With Impella support 3. VDRF.  Intubated, vent management per primary service.  On 70% FiO2 4. Acute kidney injury likely related to ATN.  Will continue with CRRT, changing bags as below.  Can aim for net even to net -50 mL/h, he is 7.1 L positive 5. Hyperkalemia-improved.  Secondary to AKI and numerous blood products.  Changing bags to 2K/2.5 calcium can stop Lokelma for now.  If potassium remains the same or rebounds up would restart Lokelma 10 g 3 times daily 6. Anion gap metabolic acidosis secondary to lactic acidosis and AKI, improved.  Stopping bicarb.  Lactic acid trending down 7. NSVT: On amnio drip 8. Acute liver injury likely related to shock liver  Subjective:   Seen and examined on CRRT. Tolerating CRRT thus far. No acute events overnight, K improving. UOP 366m, net positive 7.1L since yesterday. More on the alkalotic side with last blood gas.  Remains on pressors.  Discussed with daughter at the bedside   Objective:   BP (!) 166/74 Comment: aline reading  Pulse 90   Temp (!) 95.7 F (35.4 C) (Core)   Resp 20   Ht _0  (1.778 m)   Wt 74.7 kg   SpO2 97%   BMI 23.63 kg/m   Intake/Output Summary (Last 24 hours) at 01/12/2020 0946 Last data filed at 01/12/2020 0900 Gross per 24 hour  Intake 7337.21 ml  Output 1350 ml  Net 5987.21 ml   Weight change:   Physical Exam: Gen:ill appearing, sedated/intubated CVS:s1s2, rrr Resp:cta bl, intubated AWUX:LKGM nt/nd Ext:no edema Neuro: sedated, intermittently opens eyes  Imaging: DG Chest Port 1 View  Result Date: 01/12/2020 CLINICAL DATA:  CHF EXAM: PORTABLE CHEST 1 VIEW COMPARISON:  01/06/2020 FINDINGS: Endotracheal tube, enteric tube, right IJ Swan-Ganz catheter, additional right  central line, right PICC line, bilateral chest tubes, and Impella device are again identified. No pneumothorax. Trace left pleural effusion is unchanged. Similar diffuse interstitial prominence. Stable heart size. IMPRESSION: No significant interval change. Stable interstitial prominence likely reflecting edema and trace left pleural effusion. Electronically Signed   By: Adrian Phillips.   On: 01/12/2020 08:17   DG Chest Port 1 View  Result Date: 01/03/2020 CLINICAL DATA:  History of CABG. EXAM: PORTABLE CHEST 1 VIEW COMPARISON:  Prior study same day. FINDINGS: Endotracheal tube, NG tube, right central line, Swan-Ganz catheter, mediastinal drainage catheter bilateral chest tubes ventricular assist device in stable position. Prior CABG. Stable cardiomegaly. Diffuse bilateral pulmonary interstitial prominence again noted without interim change. IMPRESSION: 1. Lines and tubes in stable position. No pneumothorax. 2. Prior CABG. Stable cardiomegaly. 3. Diffuse bilateral interstitial prominence consistent with interstitial edema again noted without interim change. Electronically Signed   By: Adrian Phillips   On: 01/19/2020 14:19   DG Chest Port 1 View  Result Date: 12/22/2019 CLINICAL DATA:  Hypoxia EXAM: PORTABLE CHEST 1 VIEW COMPARISON:  January 11, 2020 study obtained earlier in the day FINDINGS: Endotracheal tube tip is 5.5 cm above the carina. Nasogastric tube present with tip and side port below the diaphragm. Impella device present, unchanged. No appreciable change in bilateral chest tube and mediastinal positioning. Swan-Ganz catheter tip is in the right main pulmonary artery. No pneumothorax. There is diffuse interstitial edema, similar to earlier study. No consolidation. Heart upper  normal in size. There is aortic atherosclerosis. No adenopathy IMPRESSION: Tube and catheter positions as described without pneumothorax. Generalized interstitial edema present. No consolidation. Stable cardiac  silhouette. Aortic Atherosclerosis (ICD10-I70.0). Electronically Signed   By: Adrian Phillips.   On: 01/10/2020 13:32   DG Chest Port 1 View  Result Date: 01/13/2020 CLINICAL DATA:  Sternal washout and chest tube placement EXAM: PORTABLE CHEST 1 VIEW COMPARISON:  Earlier same day FINDINGS: One right and two left sided chest tubes are present. Impella device is present. Right IJ Swan-Ganz catheter tip now overlies interlobar pulmonary artery. Endotracheal tube is present. No pneumothorax. Persistent small left pleural effusion. Stable interstitial prominence, which may reflect edema asymmetric to the left. IMPRESSION: Interval repositioning of chest tubes. Right IJ Swan-Ganz catheter tip now overlies interlobar pulmonary artery. Similar interstitial prominence likely reflecting edema. Electronically Signed   By: Adrian Phillips Phillips.   On: 12/26/2019 12:25   DG CHEST PORT 1 VIEW  Result Date: 01/20/2020 CLINICAL DATA:  Hypoxia EXAM: PORTABLE CHEST 1 VIEW COMPARISON:  January 11, 2020 FINDINGS: Endotracheal tube tip is 4.9 cm above the carina. Nasogastric tube tip and side port are below the diaphragm. Swan-Ganz catheter tip is in the right main pulmonary artery. Impella device present, unchanged in position. There is a mediastinal drain. There are 2 chest tubes on the left and a chest tube on the right. No appreciable pneumothorax. There is a small left pleural effusion. There are scattered areas of interstitial thickening, likely representing a degree of interstitial edema. No consolidation. Heart is mildly enlarged with pulmonary vascular normal. There is aortic atherosclerosis. No adenopathy. No bone lesions. IMPRESSION: Tube and catheter positions as described without pneumothorax. Small left pleural effusion with mild interstitial edema bilaterally. No consolidation. Stable cardiac prominence. Aortic Atherosclerosis (ICD10-I70.0). Electronically Signed   By: Adrian Phillips.   On:  01/13/2020 07:57   DG CHEST PORT 1 VIEW  Result Date: 12/29/2019 CLINICAL DATA:  CHF. EXAM: PORTABLE CHEST 1 VIEW COMPARISON:  01/10/2020. FINDINGS: Swan-Ganz catheter tip now noted over the right pulmonary artery. Two left chest tubes, right chest tube, mediastinal drainage catheter, right PICC line, left ventricular assist device in stable position. Prior CABG. Stable cardiomegaly. Bilateral pulmonary edema/infiltrates again noted. Interim worsening on the right. Progressive right pleural effusion. Improved left pleural effusion. No pneumothorax. IMPRESSION: 1. Swan-Ganz catheter tip now noted over the right pulmonary artery. Remaining lines and tubes including bilateral chest tubes in stable position. No pneumothorax. 2. Prior CABG. Stable cardiomegaly. 3. Bilateral pulmonary edema/infiltrates again noted. Interim worsening on the right. Progressive right pleural effusion. Improved left pleural effusion. Electronically Signed   By: Adrian Moores  Phillips   On: 01/16/2020 07:53   ECHOCARDIOGRAM LIMITED  Result Date: 01/12/2020    ECHOCARDIOGRAM LIMITED REPORT   Patient Name:   Adrian Phillips Date of Exam: 01/07/2020 Medical Rec #:  [id number]             Height:       70.0 in Accession #:    [id number]            Weight:       145.5 lb Date of Birth:  12-03-1950             BSA:          1.823 m Patient Age:    37 years              BP:  44/25 mmHg Patient Gender: M                     HR:           79 bpm. Exam Location:  Inpatient Procedure: Limited Echo Indications:    Cardiac arrest I46.9  History:        Patient has prior history of Echocardiogram examinations, most                 recent 12/26/2019. Acute MI and CAD, Prior CABG, PAD; Risk                 Factors:Hypertension, Dyslipidemia and Current Smoker. Impella                 positioning after cardiac arrest.  Sonographer:    Darlina Sicilian RDCS Referring Phys: [place]  1. Limited echocardogram for  Impella positioning. Aortic valve to Impella tip distance 5.5 cm. Ena Dawley MD Electronically signed by Ena Dawley MD Signature Date/Time: 01/09/2020/9:02:50 PM    Final     Labs: BMET Recent Labs  Lab 01/10/20 1191 01/10/20 0446 01/10/20 1644 01/10/20 2238 12/30/2019 0358 01/15/2020 0407 12/25/2019 0932 01/19/2020 1035 01/15/2020 1221 12/29/2019 1240 01/13/2020 1250 12/30/2019 1250 01/07/2020 1502 01/13/2020 1502 01/07/2020 1635 01/20/2020 1635 12/30/2019 1638 12/27/2019 1638 12/29/2019 1800 01/20/2020 1851 01/06/2020 2032 01/12/20 0007 01/12/20 0233 01/12/20 0436 01/12/20 0854  NA 142   < > 140   < > 143   < > 142   < > 141   < > 138   < > 144  --  142  --  139  --   --  138  --   --  139 137 137  K 5.1   < > 4.8   < > 5.2*   < > 5.8*   < > 7.3*   < > 7.5*   < > 7.0*   < > >7.5*   < > 7.9*   < > >7.5* 7.9* >7.5* 6.8* 6.4* 5.8* 5.5*  CL 107   < > 106  --  108  --  105  --  105  --  101  --   --   --  103  --   --   --   --   --   --   --  98  --  95*  CO2 23  --  18*  --  17*  --   --   --  18*  --   --   --   --   --  18*  --   --   --   --   --   --   --  26  --  26  GLUCOSE 149*   < > 133*  --  68*  --  68*  --  114*  --  114*  --   --   --  159*  --   --   --   --   --   --   --  97  --  91  BUN 29*   < > 40*  --  49*  --  51*  --  51*  --  54*  --   --   --  47*  --   --   --   --   --   --   --  36*  --  30*  CREATININE 2.17*   < > 2.54*  --  2.83*  --  3.40*  --  3.03*  --  3.80*  --   --   --  2.78*  --   --   --   --   --   --   --  2.21*  --  2.02*  CALCIUM 8.1*  --  7.7*  --  7.4*  --   --   --  6.0*  --   --   --   --   --  8.3*  --   --   --   --   --   --   --  5.8*  --  5.4*  PHOS  --   --  6.1*  --   --   --   --   --   --   --   --   --   --   --  >30.0*  --   --   --   --   --   --   --  10.2*  --   --    < > = values in this interval not displayed.   CBC Recent Labs  Lab 12/31/2019 1221 12/28/2019 1240 01/03/2020 1526 01/15/2020 1638 12/31/2019 1851 01/16/2020 2031 01/12/20 0420  01/12/20 0436  WBC 11.5*  --  10.3  --   --  12.7* 13.0*  --   NEUTROABS  --   --   --   --   --   --  10.3*  --   HGB 10.8*   < > 8.5*   < > 10.2* 11.9* 12.4* 11.6*  HCT 33.7*   < > 26.3*   < > 30.0* 35.4* 36.1* 34.0*  MCV 93.1  --  94.6  --   --  89.6 88.0  --   PLT 47*  --  114*  --   --  104* 72*  --    < > = values in this interval not displayed.    Medications:    . sodium chloride   Intravenous Once  . sodium chloride   Intravenous Once  . acetaminophen  1,000 mg Per Tube Q6H   Or  . acetaminophen (TYLENOL) oral liquid 160 mg/5 mL  1,000 mg Per Tube Q6H  . aspirin EC  325 mg Oral Daily   Or  . aspirin  324 mg Per Tube Daily  . bisacodyl  10 mg Oral Daily   Or  . bisacodyl  10 mg Rectal Daily  . chlorhexidine gluconate (MEDLINE KIT)  15 mL Mouth Rinse BID  . Chlorhexidine Gluconate Cloth  6 each Topical Daily  . docusate sodium  200 mg Oral Daily  . fentaNYL (SUBLIMAZE) injection  25 mcg Intravenous Once  . hydrocortisone sod succinate (SOLU-CORTEF) inj  50 mg Intravenous Q6H  . influenza vaccine adjuvanted  0.5 mL Intramuscular Tomorrow-1000  . insulin aspart  0-24 Units Subcutaneous Q4H  . ipratropium-albuterol  3 mL Nebulization Q6H  . mouth rinse  15 mL Mouth Rinse 10 times per day  . polyethylene glycol  17 g Oral Daily  . sodium chloride flush  10-40 mL Intracatheter Q12H  . sodium chloride flush  3 mL Intravenous Q12H  . sodium zirconium cyclosilicate  10 g Per Tube TID  . thiamine  100 mg Oral Daily   Or  . thiamine  100 mg Intravenous Daily      Gean Quint, MD [facility]  Phillips 01/12/2020, 9:46 AM

## 2020-01-12 NOTE — Progress Notes (Signed)
Pharmacy Antibiotic Note  Adrian Phillips is a 69 y.o. male admitted on 01/21/2020 with sepsis/PNA. Pharmacy has been consulted for dosing.  CRRT started yesterday.   Vancomycin level 11 this morning--appears to be clearing as expected via CRRT.   Plan: Vancomycin 750 mg IV q24h  Height: 5\' 10"  (177.8 cm) Weight: 66 kg (145 lb 8.1 oz) IBW/kg (Calculated) : 73  Temp (24hrs), Avg:97.2 F (36.2 C), Min:93.6 F (34.2 C), Max:99.1 F (37.3 C)  Recent Labs  Lab 01/10/20 0426 01/10/20 0754 01/10/20 1625 01/10/20 1644 01/10/20 1644 12/25/2019 0358 01/18/2020 0358 12/28/2019 0410 01/01/2020 0932 01/12/2020 1221 01/04/2020 1250 01/01/2020 1526 12/23/2019 1622 01/02/2020 1635 01/17/2020 2031 01/12/20 0233 01/12/20 0234  WBC   < >  --   --  17.9*  --  18.8*  --   --   --  11.5*  --  10.3  --   --  12.7*  --   --   CREATININE   < >  --   --  2.54*   < > 2.83*   < >  --  3.40* 3.03* 3.80*  --   --  2.78*  --  2.21*  --   LATICACIDVEN  --  8.6* 8.5*  --   --   --   --  10.1*  --   --   --   --  10.9*  --   --  4.9*  --   VANCORANDOM  --   --   --   --   --   --   --   --   --   --   --   --   --   --   --   --  11   < > = values in this interval not displayed.    Estimated Creatinine Clearance: 29.4 mL/min (A) (by C-G formula based on SCr of 2.21 mg/dL (H)).    Allergies  Allergen Reactions  . Lisinopril Swelling    Angioedema in 2020    Antimicrobials this admission: Rocephin 10/15 >> 10/19 Cefepime 10/20 >> Vancomycin 10/18 (pre/post op); 10/21 >>  Dose adjustments this admission: n/a  Microbiology results: 10/11 Fluvid >> neg 10/11 MRSA PCR >> neg 10/21 BCx >> ng<12h  11/21, PharmD, BCPS

## 2020-01-12 NOTE — Progress Notes (Signed)
1 Day Post-Op Procedure(s) (LRB): STERNAL RE EXPLORATION WITH PUMP STANDBY, REMOVAL OF HEMATOMA (N/A) TRANSESOPHAGEAL ECHOCARDIOGRAM (TEE) (N/A) INSERTION OF DIALYSIS CATHETER USING TRIALYSIS 85F 20CM Subjective  Sedated on vent - responds to voice Impella flows improved, coox improved CXR improved CRT to remove K Low dose bival started for 5.5 Objective: Vital signs in last 24 hours: Temp:  [93.6 F (34.2 C)-98.2 F (36.8 C)] 96.1 F (35.6 C) (10/22 1200) Pulse Rate:  [45-96] 88 (10/22 1200) Cardiac Rhythm: A-V Sequential paced (10/22 0800) Resp:  [14-23] 20 (10/22 1200) BP: (37-166)/(17-92) 106/92 (10/22 1041) SpO2:  [93 %-100 %] 96 % (10/22 1200) Arterial Line BP: (66-243)/(51-83) 124/66 (10/22 1200) FiO2 (%):  [70 %-100 %] 70 % (10/22 1042) Weight:  [74.7 kg] 74.7 kg (10/22 0500)  Hemodynamic parameters for last 24 hours: PAP: (21-59)/(8-32) 22/9 CVP:  [3 mmHg-32 mmHg] 5 mmHg PCWP:  [27 mmHg] 27 mmHg CO:  [3.4 L/min-7 L/min] 3.8 L/min CI:  [2 L/min/m2-4.1 L/min/m2] 2.2 L/min/m2  Intake/Output from previous day: 10/21 0701 - 10/22 0700 In: 8513.5 [I.V.:5277; Blood:2412; NG/GT:50; IV Piggyback:550.1] Out: 1366 [Urine:330; Emesis/NG output:250; Chest Tube:790] Intake/Output this shift: Total I/O In: 1064.2 [I.V.:664.2; Other:60; NG/GT:90; IV Piggyback:250] Out: 494 [Urine:10; Other:454; Chest Tube:30]  EXAM Responds to voice Lungs clear A-V paced No airleak from chest tubes extrem warm  Lab Results: Recent Labs    01/13/2020 2031 12/24/2019 2031 01/12/20 0420 01/12/20 0436  WBC 12.7*  --  13.0*  --   HGB 11.9*   < > 12.4* 11.6*  HCT 35.4*   < > 36.1* 34.0*  PLT 104*  --  72*  --    < > = values in this interval not displayed.   BMET:  Recent Labs    01/12/20 0233 01/12/20 0233 01/12/20 0436 01/12/20 0436 01/12/20 0854 01/12/20 1125  NA 139   < > 137  --  137  --   K 6.4*   < > 5.8*   < > 5.5* 5.2*  CL 98  --   --   --  95*  --   CO2 26  --   --    --  26  --   GLUCOSE 97  --   --   --  91  --   BUN 36*  --   --   --  30*  --   CREATININE 2.21*  --   --   --  2.02*  --   CALCIUM 5.8*  --   --   --  5.4*  --    < > = values in this interval not displayed.    PT/INR:  Recent Labs    01/12/20 0826  LABPROT 33.8*  INR 3.5*   ABG    Component Value Date/Time   PHART 7.491 (H) 01/12/2020 0436   HCO3 32.7 (H) 01/12/2020 0436   TCO2 34 (H) 01/12/2020 0436   ACIDBASEDEF 3.0 (H) 01/07/2020 1851   O2SAT 97.0 01/12/2020 0436   CBG (last 3)  Recent Labs    01/12/20 0435 01/12/20 0750 01/12/20 1145  GLUCAP 91 91 84    Assessment/Plan: S/P Procedure(s) (LRB): STERNAL RE EXPLORATION WITH PUMP STANDBY, REMOVAL OF HEMATOMA (N/A) TRANSESOPHAGEAL ECHOCARDIOGRAM (TEE) (N/A) INSERTION OF DIALYSIS CATHETER USING TRIALYSIS 85F 20CM Continue support  Delay tube feeding until LFts start  improve as marker for mesenteric hypoperfusion   LOS: 11 days    Adrian Phillips 01/12/2020

## 2020-01-12 NOTE — Progress Notes (Signed)
CRITICAL VALUE ALERT  Critical Value:  Potassium >7.5  Date & Time Notied:  01/15/2020@2133   Provider Notified: Dr. Ruben Reason at 2140  Orders Received/Actions taken: see orders

## 2020-01-13 ENCOUNTER — Inpatient Hospital Stay (HOSPITAL_COMMUNITY): Payer: No Typology Code available for payment source

## 2020-01-13 DIAGNOSIS — J9601 Acute respiratory failure with hypoxia: Secondary | ICD-10-CM | POA: Diagnosis not present

## 2020-01-13 DIAGNOSIS — I5021 Acute systolic (congestive) heart failure: Secondary | ICD-10-CM | POA: Diagnosis not present

## 2020-01-13 DIAGNOSIS — R570 Cardiogenic shock: Secondary | ICD-10-CM | POA: Diagnosis not present

## 2020-01-13 DIAGNOSIS — Z95811 Presence of heart assist device: Secondary | ICD-10-CM | POA: Diagnosis not present

## 2020-01-13 LAB — RENAL FUNCTION PANEL
Albumin: 1.7 g/dL — ABNORMAL LOW (ref 3.5–5.0)
Anion gap: 18 — ABNORMAL HIGH (ref 5–15)
BUN: 18 mg/dL (ref 8–23)
CO2: 14 mmol/L — ABNORMAL LOW (ref 22–32)
Calcium: 5.9 mg/dL — CL (ref 8.9–10.3)
Chloride: 102 mmol/L (ref 98–111)
Creatinine, Ser: 1.54 mg/dL — ABNORMAL HIGH (ref 0.61–1.24)
GFR, Estimated: 49 mL/min — ABNORMAL LOW (ref >60)
Glucose, Bld: 78 mg/dL (ref 70–99)
Phosphorus: 8.6 mg/dL — ABNORMAL HIGH (ref 2.5–4.6)
Potassium: 6.6 mmol/L (ref 3.5–5.1)
Sodium: 134 mmol/L — ABNORMAL LOW (ref 135–145)

## 2020-01-13 LAB — POCT I-STAT 7, (LYTES, BLD GAS, ICA,H+H)
Acid-base deficit: 6 mmol/L — ABNORMAL HIGH (ref 0.0–2.0)
Acid-base deficit: 13 mmol/L — ABNORMAL HIGH (ref 0.0–2.0)
Acid-base deficit: 12 mmol/L — ABNORMAL HIGH (ref 0.0–2.0)
Acid-base deficit: 12 mmol/L — ABNORMAL HIGH (ref 0.0–2.0)
Acid-base deficit: 12 mmol/L — ABNORMAL HIGH (ref 0.0–2.0)
Acid-base deficit: 13 mmol/L — ABNORMAL HIGH (ref 0.0–2.0)
Bicarbonate: 20.1 mmol/L (ref 20.0–28.0)
Bicarbonate: 13.5 mmol/L — ABNORMAL LOW (ref 20.0–28.0)
Bicarbonate: 14.9 mmol/L — ABNORMAL LOW (ref 20.0–28.0)
Bicarbonate: 15.1 mmol/L — ABNORMAL LOW (ref 20.0–28.0)
Bicarbonate: 14.6 mmol/L — ABNORMAL LOW (ref 20.0–28.0)
Bicarbonate: 14.3 mmol/L — ABNORMAL LOW (ref 20.0–28.0)
Calcium, Ion: 0.77 mmol/L — CL (ref 1.15–1.40)
Calcium, Ion: 0.67 mmol/L — CL (ref 1.15–1.40)
Calcium, Ion: 0.82 mmol/L — CL (ref 1.15–1.40)
Calcium, Ion: 0.83 mmol/L — CL (ref 1.15–1.40)
Calcium, Ion: 0.83 mmol/L — CL (ref 1.15–1.40)
Calcium, Ion: 0.82 mmol/L — CL (ref 1.15–1.40)
HCT: 36 % — ABNORMAL LOW (ref 39.0–52.0)
HCT: 27 % — ABNORMAL LOW (ref 39.0–52.0)
HCT: 27 % — ABNORMAL LOW (ref 39.0–52.0)
HCT: 30 % — ABNORMAL LOW (ref 39.0–52.0)
HCT: 31 % — ABNORMAL LOW (ref 39.0–52.0)
HCT: 32 % — ABNORMAL LOW (ref 39.0–52.0)
Hemoglobin: 12.2 g/dL — ABNORMAL LOW (ref 13.0–17.0)
Hemoglobin: 9.2 g/dL — ABNORMAL LOW (ref 13.0–17.0)
Hemoglobin: 9.2 g/dL — ABNORMAL LOW (ref 13.0–17.0)
Hemoglobin: 10.2 g/dL — ABNORMAL LOW (ref 13.0–17.0)
Hemoglobin: 10.5 g/dL — ABNORMAL LOW (ref 13.0–17.0)
Hemoglobin: 10.9 g/dL — ABNORMAL LOW (ref 13.0–17.0)
O2 Saturation: 95 %
O2 Saturation: 93 %
O2 Saturation: 93 %
O2 Saturation: 93 %
O2 Saturation: 93 %
O2 Saturation: 93 %
Patient temperature: 35.4
Patient temperature: 37
Patient temperature: 36.3
Patient temperature: 35.5
Patient temperature: 35
Patient temperature: 34.6
Potassium: 6.3 mmol/L (ref 3.5–5.1)
Potassium: 6.5 mmol/L (ref 3.5–5.1)
Potassium: 6.1 mmol/L — ABNORMAL HIGH (ref 3.5–5.1)
Potassium: 6.5 mmol/L (ref 3.5–5.1)
Potassium: 6.3 mmol/L (ref 3.5–5.1)
Potassium: 6.3 mmol/L (ref 3.5–5.1)
Sodium: 133 mmol/L — ABNORMAL LOW (ref 135–145)
Sodium: 136 mmol/L (ref 135–145)
Sodium: 134 mmol/L — ABNORMAL LOW (ref 135–145)
Sodium: 132 mmol/L — ABNORMAL LOW (ref 135–145)
Sodium: 133 mmol/L — ABNORMAL LOW (ref 135–145)
Sodium: 132 mmol/L — ABNORMAL LOW (ref 135–145)
TCO2: 21 mmol/L — ABNORMAL LOW (ref 22–32)
TCO2: 14 mmol/L — ABNORMAL LOW (ref 22–32)
TCO2: 16 mmol/L — ABNORMAL LOW (ref 22–32)
TCO2: 16 mmol/L — ABNORMAL LOW (ref 22–32)
TCO2: 16 mmol/L — ABNORMAL LOW (ref 22–32)
TCO2: 15 mmol/L — ABNORMAL LOW (ref 22–32)
pCO2 arterial: 36.5 mmHg (ref 32.0–48.0)
pCO2 arterial: 31.5 mmHg — ABNORMAL LOW (ref 32.0–48.0)
pCO2 arterial: 35 mmHg (ref 32.0–48.0)
pCO2 arterial: 34.6 mmHg (ref 32.0–48.0)
pCO2 arterial: 33.4 mmHg (ref 32.0–48.0)
pCO2 arterial: 33.5 mmHg (ref 32.0–48.0)
pH, Arterial: 7.341 — ABNORMAL LOW (ref 7.350–7.450)
pH, Arterial: 7.24 — ABNORMAL LOW (ref 7.350–7.450)
pH, Arterial: 7.232 — ABNORMAL LOW (ref 7.350–7.450)
pH, Arterial: 7.239 — ABNORMAL LOW (ref 7.350–7.450)
pH, Arterial: 7.238 — ABNORMAL LOW (ref 7.350–7.450)
pH, Arterial: 7.225 — ABNORMAL LOW (ref 7.350–7.450)
pO2, Arterial: 74 mmHg — ABNORMAL LOW (ref 83.0–108.0)
pO2, Arterial: 77 mmHg — ABNORMAL LOW (ref 83.0–108.0)
pO2, Arterial: 77 mmHg — ABNORMAL LOW (ref 83.0–108.0)
pO2, Arterial: 71 mmHg — ABNORMAL LOW (ref 83.0–108.0)
pO2, Arterial: 72 mmHg — ABNORMAL LOW (ref 83.0–108.0)
pO2, Arterial: 69 mmHg — ABNORMAL LOW (ref 83.0–108.0)

## 2020-01-13 LAB — POTASSIUM
Potassium: 6.2 mmol/L — ABNORMAL HIGH (ref 3.5–5.1)
Potassium: 6.5 mmol/L (ref 3.5–5.1)
Potassium: 6.8 mmol/L (ref 3.5–5.1)

## 2020-01-13 LAB — COMPREHENSIVE METABOLIC PANEL
ALT: 3723 U/L — ABNORMAL HIGH (ref 0–44)
AST: 8659 U/L — ABNORMAL HIGH (ref 15–41)
Albumin: 1.9 g/dL — ABNORMAL LOW (ref 3.5–5.0)
Alkaline Phosphatase: 253 U/L — ABNORMAL HIGH (ref 38–126)
Anion gap: 16 — ABNORMAL HIGH (ref 5–15)
BUN: 21 mg/dL (ref 8–23)
CO2: 17 mmol/L — ABNORMAL LOW (ref 22–32)
Calcium: 5.7 mg/dL — CL (ref 8.9–10.3)
Chloride: 101 mmol/L (ref 98–111)
Creatinine, Ser: 1.67 mg/dL — ABNORMAL HIGH (ref 0.61–1.24)
GFR, Estimated: 44 mL/min — ABNORMAL LOW (ref >60)
Glucose, Bld: 55 mg/dL — ABNORMAL LOW (ref 70–99)
Potassium: 6.3 mmol/L (ref 3.5–5.1)
Sodium: 134 mmol/L — ABNORMAL LOW (ref 135–145)
Total Bilirubin: 7.3 mg/dL — ABNORMAL HIGH (ref 0.3–1.2)
Total Protein: 3.5 g/dL — ABNORMAL LOW (ref 6.5–8.1)

## 2020-01-13 LAB — GLUCOSE, CAPILLARY
Glucose-Capillary: 136 mg/dL — ABNORMAL HIGH (ref 70–99)
Glucose-Capillary: 145 mg/dL — ABNORMAL HIGH (ref 70–99)
Glucose-Capillary: 146 mg/dL — ABNORMAL HIGH (ref 70–99)
Glucose-Capillary: 160 mg/dL — ABNORMAL HIGH (ref 70–99)
Glucose-Capillary: 172 mg/dL — ABNORMAL HIGH (ref 70–99)
Glucose-Capillary: 187 mg/dL — ABNORMAL HIGH (ref 70–99)
Glucose-Capillary: 36 mg/dL — CL (ref 70–99)
Glucose-Capillary: 36 mg/dL — CL (ref 70–99)
Glucose-Capillary: 40 mg/dL — CL (ref 70–99)
Glucose-Capillary: 54 mg/dL — ABNORMAL LOW (ref 70–99)
Glucose-Capillary: 59 mg/dL — ABNORMAL LOW (ref 70–99)
Glucose-Capillary: 62 mg/dL — ABNORMAL LOW (ref 70–99)

## 2020-01-13 LAB — APTT
aPTT: 60 seconds — ABNORMAL HIGH (ref 24–36)
aPTT: 57 seconds — ABNORMAL HIGH (ref 24–36)
aPTT: 58 seconds — ABNORMAL HIGH (ref 24–36)

## 2020-01-13 LAB — COOXEMETRY PANEL
Carboxyhemoglobin: 0.5 % (ref 0.5–1.5)
Carboxyhemoglobin: 0.6 % (ref 0.5–1.5)
Methemoglobin: 0.8 % (ref 0.0–1.5)
Methemoglobin: 1 % (ref 0.0–1.5)
O2 Saturation: 72.2 %
O2 Saturation: 65.1 %
Total hemoglobin: 11.2 g/dL — ABNORMAL LOW (ref 12.0–16.0)
Total hemoglobin: 10.1 g/dL — ABNORMAL LOW (ref 12.0–16.0)

## 2020-01-13 LAB — CBC
HCT: 38.4 % — ABNORMAL LOW (ref 39.0–52.0)
Hemoglobin: 11.8 g/dL — ABNORMAL LOW (ref 13.0–17.0)
MCH: 29.6 pg (ref 26.0–34.0)
MCHC: 30.7 g/dL (ref 30.0–36.0)
MCV: 96.2 fL (ref 80.0–100.0)
Platelets: 40 10*3/uL — ABNORMAL LOW (ref 150–400)
RBC: 3.99 MIL/uL — ABNORMAL LOW (ref 4.22–5.81)
RDW: 15.9 % — ABNORMAL HIGH (ref 11.5–15.5)
WBC: 18.8 10*3/uL — ABNORMAL HIGH (ref 4.0–10.5)
nRBC: 16.8 % — ABNORMAL HIGH (ref 0.0–0.2)

## 2020-01-13 LAB — PROTIME-INR
INR: 5.5 (ref 0.8–1.2)
Prothrombin Time: 48.7 seconds — ABNORMAL HIGH (ref 11.4–15.2)

## 2020-01-13 LAB — LACTIC ACID, PLASMA: Lactic Acid, Venous: 9.6 mmol/L (ref 0.5–1.9)

## 2020-01-13 LAB — LACTATE DEHYDROGENASE: LDH: 9748 U/L — ABNORMAL HIGH (ref 98–192)

## 2020-01-13 LAB — PROCALCITONIN: Procalcitonin: 24.29 ng/mL

## 2020-01-13 LAB — MAGNESIUM: Magnesium: 2.6 mg/dL — ABNORMAL HIGH (ref 1.7–2.4)

## 2020-01-13 MED ORDER — DEXTROSE 50 % IV SOLN
INTRAVENOUS | Status: AC
  Administered 2020-01-13: 50 mL
  Filled 2020-01-13: qty 50

## 2020-01-13 MED ORDER — CALCIUM GLUCONATE-NACL 1-0.675 GM/50ML-% IV SOLN
1.0000 g | Freq: Once | INTRAVENOUS | Status: AC
  Administered 2020-01-13: 1000 mg via INTRAVENOUS
  Filled 2020-01-13: qty 50

## 2020-01-13 MED ORDER — DEXTROSE 50 % IV SOLN
12.5000 g | Freq: Once | INTRAVENOUS | Status: AC
  Administered 2020-01-13: 12.5 g via INTRAVENOUS

## 2020-01-13 MED ORDER — SODIUM ZIRCONIUM CYCLOSILICATE 10 G PO PACK
10.0000 g | PACK | Freq: Three times a day (TID) | ORAL | Status: DC
  Administered 2020-01-13 (×3): 10 g via ORAL
  Filled 2020-01-13 (×4): qty 1

## 2020-01-13 MED ORDER — CALCIUM GLUCONATE 10 % IV SOLN
1.0000 g | Freq: Once | INTRAVENOUS | Status: DC
  Filled 2020-01-13: qty 10

## 2020-01-13 MED ORDER — VITAMIN K1 10 MG/ML IJ SOLN
2.5000 mg | Freq: Once | INTRAVENOUS | Status: AC
  Administered 2020-01-13: 2.5 mg via INTRAVENOUS
  Filled 2020-01-13: qty 0.25

## 2020-01-13 MED ORDER — NAPHAZOLINE-GLYCERIN 0.012-0.2 % OP SOLN
1.0000 [drp] | Freq: Four times a day (QID) | OPHTHALMIC | Status: DC | PRN
  Filled 2020-01-13: qty 15

## 2020-01-13 MED ORDER — DEXTROSE 50 % IV SOLN: 25.0000 g | INTRAVENOUS | Status: AC

## 2020-01-13 MED ORDER — PRISMASOL BGK 0/2.5 32-2.5 MEQ/L IV SOLN
INTRAVENOUS | Status: DC
  Filled 2020-01-13 (×11): qty 5000

## 2020-01-13 MED ORDER — DEXTROSE 50 % IV SOLN
25.0000 g | INTRAVENOUS | Status: AC
  Administered 2020-01-13: 25 g via INTRAVENOUS

## 2020-01-13 MED ORDER — DEXTROSE 50 % IV SOLN
INTRAVENOUS | Status: AC
  Administered 2020-01-13: 50 mL
  Filled 2020-01-13: qty 100

## 2020-01-13 MED ORDER — SODIUM CHLORIDE 0.9% IV SOLUTION: Freq: Once | INTRAVENOUS | Status: AC

## 2020-01-13 MED ORDER — CALCIUM CHLORIDE 10 % IV SOLN
1.0000 g | Freq: Once | INTRAVENOUS | Status: AC
  Administered 2020-01-13: 1 g via INTRAVENOUS

## 2020-01-13 MED ORDER — PRISMASOL BGK 0/2.5 32-2.5 MEQ/L REPLACEMENT SOLN
Status: DC
  Filled 2020-01-13 (×6): qty 5000

## 2020-01-13 MED ORDER — PRISMASOL BGK 0/2.5 32-2.5 MEQ/L REPLACEMENT SOLN
Status: DC
  Filled 2020-01-13 (×2): qty 5000

## 2020-01-13 MED ORDER — CALCIUM GLUCONATE-NACL 2-0.675 GM/100ML-% IV SOLN
2.0000 g | Freq: Once | INTRAVENOUS | Status: AC
  Administered 2020-01-13: 2000 mg via INTRAVENOUS
  Filled 2020-01-13: qty 100

## 2020-01-13 MED ORDER — SODIUM CHLORIDE 0.9 % IV SOLN
0.0040 mg/kg/h | INTRAVENOUS | Status: DC
  Administered 2020-01-13: 0.007 mg/kg/h via INTRAVENOUS
  Filled 2020-01-13: qty 250

## 2020-01-13 MED ORDER — POLYVINYL ALCOHOL 1.4 % OP SOLN
2.0000 [drp] | OPHTHALMIC | Status: DC | PRN
  Administered 2020-01-14: 2 [drp] via OPHTHALMIC
  Filled 2020-01-13: qty 15

## 2020-01-13 NOTE — Progress Notes (Signed)
Patient glucose 54 at 0400, as per standing orders for hypoglycemia, patient given 12.5g of D50.  Rechecked at 0415, glucose 145.

## 2020-01-13 NOTE — Progress Notes (Signed)
Spoke with Thedore Mins, MD Critical k 6.8 despite Lokelma this AM. Verbal order to change Prefilter to a 0K/ 2.5 Calcium at the same rate. Will start once available. MD also made aware of pt's hypoglycemia episodes today resulting in 3 amps of D50. MD aware. Will continue to monitor and check BG frequently.   Delories Heinz, RN

## 2020-01-13 NOTE — Progress Notes (Signed)
Adrian Phillips Progress Note    Assessment/ Plan:   1. 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  Management per primary service 2. Acute systolic CHF/cardiogenic shock on epi, levo, and vasopressin.  Also on milrinone.  With Impella support 3. VDRF, pulm edema & COPD.  Intubated, vent management per primary service. 4. Acute kidney injury likely related to ATN.  Will continue with CRRT.  Keep net even for now given high pressor requirements 5. Hyperkalemia.  Secondary to AKI and numerous blood products.  Was on 0k bath, now all 2k/2.5cal, will increase DFR to 2L/hr. Continue with lokelma 10g tid 6. Anion gap metabolic acidosis secondary to lactic acidosis and AKI. Lactic acid trending back up 7. NSVT: On amio drip 8. Acute liver injury likely related to shock liver 9. ABLA, left hemothorax. Chest tube in place  Subjective:   Seen and examined on CRRT. Tolerating CRRT thus far. Running net even. No issues with impella. Worsening lactic acidosis and K. Still requiring pressor support. Net positive ~1.1L   Objective:   BP (!) 83/65 (BP Location: Left Arm)   Pulse 69   Temp 99.5 F (37.5 C)   Resp (!) 23   Ht 5' 10"  (1.778 m)   Wt 78.9 kg   SpO2 94%   BMI 24.96 kg/m   Intake/Output Summary (Last 24 hours) at 01/13/2020 1220 Last data filed at 01/13/2020 1200 Gross per 24 hour  Intake 3730.25 ml  Output 2137 ml  Net 1593.25 ml   Weight change: 4.2 kg  Physical Exam: Gen:ill appearing, sedated/intubated CVS:s1s2, rrr Resp:cta bl, intubated QZR:AQTM, nt/nd Ext:2+ edema bl le's Neuro: sedated, intermittently opens eyes  Imaging: DG Chest Port 1 View  Result Date: 01/13/2020 CLINICAL DATA:  LVAD EXAM: PORTABLE CHEST 1 VIEW COMPARISON:  Yesterday FINDINGS: Endotracheal tube with tip at the clavicular heads. The enteric tube reaches the stomach. Left ventricular assist device in stable position. Swan-Ganz catheter from the right with tip at the right main  pulmonary artery. Chest tubes in place with no visible pneumothorax. Right-sided PICC and dialysis catheter in unremarkable position. Mild interstitial edema is non progressed. Stable heart size. CABG. IMPRESSION: Stable hardware positioning and interstitial edema. No visible pneumothorax. Electronically Signed   By: Monte Fantasia M.D.   On: 01/13/2020 08:42   DG Chest Port 1 View  Result Date: 01/12/2020 CLINICAL DATA:  CHF EXAM: PORTABLE CHEST 1 VIEW COMPARISON:  01/21/2020 FINDINGS: Endotracheal tube, enteric tube, right IJ Swan-Ganz catheter, additional right central line, right PICC line, bilateral chest tubes, and Impella device are again identified. No pneumothorax. Trace left pleural effusion is unchanged. Similar diffuse interstitial prominence. Stable heart size. IMPRESSION: No significant interval change. Stable interstitial prominence likely reflecting edema and trace left pleural effusion. Electronically Signed   By: Macy Mis M.D.   On: 01/12/2020 08:17   DG Chest Port 1 View  Result Date: 01/10/2020 CLINICAL DATA:  History of CABG. EXAM: PORTABLE CHEST 1 VIEW COMPARISON:  Prior study same day. FINDINGS: Endotracheal tube, NG tube, right central line, Swan-Ganz catheter, mediastinal drainage catheter bilateral chest tubes ventricular assist device in stable position. Prior CABG. Stable cardiomegaly. Diffuse bilateral pulmonary interstitial prominence again noted without interim change. IMPRESSION: 1. Lines and tubes in stable position. No pneumothorax. 2. Prior CABG. Stable cardiomegaly. 3. Diffuse bilateral interstitial prominence consistent with interstitial edema again noted without interim change. Electronically Signed   By: Marcello Moores  Register   On: 12/27/2019 14:19   ECHOCARDIOGRAM LIMITED  Result Date: 12/23/2019  ECHOCARDIOGRAM LIMITED REPORT   Patient Name:   Adrian Phillips Date of Exam: 12/24/2019 Medical Rec #:  [id number]             Height:       70.0 in  Accession #:    [id number]            Weight:       145.5 lb Date of Birth:  Dec 13, 1950             BSA:          1.823 m Patient Age:    69 years              BP:           44/25 mmHg Patient Gender: M                     HR:           79 bpm. Exam Location:  Inpatient Procedure: Limited Echo Indications:    Cardiac arrest I46.9  History:        Patient has prior history of Echocardiogram examinations, most                 recent 12/31/2019. Acute MI and CAD, Prior CABG, PAD; Risk                 Factors:Hypertension, Dyslipidemia and Current Smoker. Impella                 positioning after cardiac arrest.  Sonographer:    Darlina Sicilian RDCS Referring Phys: [place]  1. Limited echocardogram for Impella positioning. Aortic valve to Impella tip distance 5.5 cm. Ena Dawley MD Electronically signed by Ena Dawley MD Signature Date/Time: 01/07/2020/9:02:50 PM    Final     Labs: BMET Recent Labs  Lab 01/10/20 1644 01/10/20 2238 01/07/2020 0358 12/24/2019 0407 12/28/2019 1221 01/12/2020 1240 01/04/2020 1250 12/22/2019 1502 12/31/2019 1635 12/22/2019 1638 01/12/20 0233 01/12/20 0436 01/12/20 0854 01/12/20 1125 01/12/20 1628 01/12/20 1628 01/12/20 1639 01/12/20 1727 01/12/20 1934 01/12/20 2340 01/13/20 0322 01/13/20 0351 01/13/20 1140  NA 140   < > 143   < > 141   < > 138   < > 142   < > 139   < > 137  --  136  --  135 135  --   --  134* 133* 136  K 4.8   < > 5.2*   < > 7.3*   < > 7.5*   < > >7.5*   < > 6.4*   < > 5.5*   < > 5.7*   < > 5.5* 5.6* 5.6* 6.2* 6.3* 6.3* 6.5*  CL 106  --  108   < > 105   < > 101  --  103  --  98  --  95*  --  97*  --   --  97*  --   --  101  --   --   CO2 18*  --  17*  --  18*  --   --   --  18*  --  26  --  26  --  21*  --   --   --   --   --  17*  --   --   GLUCOSE 133*  --  68*   < > 114*   < > 114*  --  159*  --  97  --  91  --  72  --   --  62*  --   --  55*  --   --   BUN 40*  --  49*   < > 51*   < > 54*  --  47*  --  36*  --  30*   --  26*  --   --  30*  --   --  21  --   --   CREATININE 2.54*  --  2.83*   < > 3.03*   < > 3.80*  --  2.78*  --  2.21*  --  2.02*  --  1.86*  --   --  2.20*  --   --  1.67*  --   --   CALCIUM 7.7*  --  7.4*  --  6.0*  --   --   --  8.3*  --  5.8*  --  5.4*  --  5.6*  --   --   --   --   --  5.7*  --   --   PHOS 6.1*  --   --   --   --   --   --   --  >30.0*  --  10.2*  --   --   --  8.2*  --   --   --   --   --   --   --   --    < > = values in this interval not displayed.   CBC Recent Labs  Lab 01/12/2020 2031 12/27/2019 2031 01/12/20 0420 01/12/20 0436 01/12/20 1924 01/13/20 0351 01/13/20 0820 01/13/20 1140  WBC 12.7*  --  13.0*  --  16.9*  --  18.8*  --   NEUTROABS  --   --  10.3*  --   --   --   --   --   HGB 11.9*   < > 12.4*   < > 13.1 12.2* 11.8* 9.2*  HCT 35.4*   < > 36.1*   < > 40.1 36.0* 38.4* 27.0*  MCV 89.6  --  88.0  --  91.6  --  96.2  --   PLT 104*  --  72*  --  54*  --  40*  --    < > = values in this interval not displayed.    Medications:    . sodium chloride   Intravenous Once  . sodium chloride   Intravenous Once  . acetaminophen  1,000 mg Per Tube Q6H   Or  . acetaminophen (TYLENOL) oral liquid 160 mg/5 mL  1,000 mg Per Tube Q6H  . bisacodyl  10 mg Oral Daily   Or  . bisacodyl  10 mg Rectal Daily  . chlorhexidine gluconate (MEDLINE KIT)  15 mL Mouth Rinse BID  . Chlorhexidine Gluconate Cloth  6 each Topical Daily  . docusate  200 mg Per Tube Daily  . fentaNYL (SUBLIMAZE) injection  25 mcg Intravenous Once  . hydrocortisone sod succinate (SOLU-CORTEF) inj  50 mg Intravenous Q6H  . influenza vaccine adjuvanted  0.5 mL Intramuscular Tomorrow-1000  . insulin aspart  0-24 Units Subcutaneous Q4H  . ipratropium-albuterol  3 mL Nebulization Q6H  . mouth rinse  15 mL Mouth Rinse 10 times per day  . polyethylene glycol  17 g Per Tube Daily  . sodium chloride flush  10-40 mL Intracatheter Q12H  . sodium chloride flush  3 mL Intravenous Q12H  .  sodium zirconium  cyclosilicate  10 g Oral TID  . thiamine  100 mg Per Tube Daily   Or  . thiamine  100 mg Intravenous Daily      Gean Quint, MD [facility] 01/13/2020, 12:20 PM

## 2020-01-13 NOTE — Progress Notes (Signed)
2 Days Post-Op Procedure(s) (LRB): STERNAL RE EXPLORATION WITH PUMP STANDBY, REMOVAL OF HEMATOMA (N/A) TRANSESOPHAGEAL ECHOCARDIOGRAM (TEE) (N/A) INSERTION OF DIALYSIS CATHETER USING TRIALYSIS 42F 20CM Subjective:  He has had worsening metabolic acidosis and increased pressor requirement today. CI 2.8 PA 28/18, CVP 14. Co-ox 72% early this am. PH 7.24, PCO2 32, PO277, HCO3 14 BE -13.  Rhythm has been variable. Sometimes appears sinus 90's but then has some bigeminy with non-perfused extra beats. Have been DDD pacing off and on depending on rhythm. He does not tolerate bigeminy.    Now on milrinone 0.3, NE 30, epi 20. Doses were escalated today due to hypotension.  Ionized calcium 0.67 and repleted. Lactic acid 9.6 Procalcitonin 24.29  INR up to 5.6 with AST 8659 and ALT 3723, LDH 9748. Some hypoglycemia this afternoon. Plts 40K  Objective: Vital signs in last 24 hours: Temp:  [95 F (35 C)-99.9 F (37.7 C)] 98.6 F (37 C) (10/23 1256) Pulse Rate:  [35-94] 61 (10/23 1256) Cardiac Rhythm: A-V Sequential paced (10/22 2000) Resp:  [15-24] 22 (10/23 1335) BP: (83)/(65) 83/65 (10/22 1500) SpO2:  [92 %-100 %] 95 % (10/23 1256) Arterial Line BP: (48-187)/(39-73) 86/39 (10/23 1335) FiO2 (%):  [50 %-70 %] 50 % (10/23 1146) Weight:  [78.9 kg] 78.9 kg (10/23 0500)  Hemodynamic parameters for last 24 hours: PAP: (16-32)/(8-20) 28/18 CVP:  [0 mmHg-18 mmHg] 14 mmHg PCWP:  [24 mmHg-25 mmHg] 25 mmHg CO:  [3.5 L/min-4.6 L/min] 3.5 L/min CI:  [2 L/min/m2-2.7 L/min/m2] 2 L/min/m2  Intake/Output from previous day: 10/22 0701 - 10/23 0700 In: 4259.5 [I.V.:2794.2; NG/GT:90; IV Piggyback:500] Out: 2503 [Urine:15; Emesis/NG output:150; Chest Tube:620] Intake/Output this shift: Total I/O In: 1397.6 [I.V.:793.7; Blood:324; Other:79.8; IV Piggyback:200.1] Out: 128 [Chest Tube:140]  General appearance: intubated and sedated on vent. Not responsive Heart: irregularly irregular rhythm Lungs:  clear to auscultation bilaterally Abdomen: firm, no BS. Extremities: anasarca Wound: dressing dry  Lab Results: Recent Labs    01/12/20 1924 01/13/20 0351 01/13/20 0820 01/13/20 1140  WBC 16.9*  --  18.8*  --   HGB 13.1   < > 11.8* 9.2*  HCT 40.1   < > 38.4* 27.0*  PLT 54*  --  40*  --    < > = values in this interval not displayed.   BMET:  Recent Labs    01/12/20 1628 01/12/20 1639 01/12/20 1727 01/12/20 1934 01/13/20 0322 01/13/20 0322 01/13/20 0351 01/13/20 1140  NA 136   < > 135   < > 134*   < > 133* 136  K 5.7*   < > 5.6*   < > 6.3*   < > 6.3* 6.5*  CL 97*   < > 97*  --  101  --   --   --   CO2 21*  --   --   --  17*  --   --   --   GLUCOSE 72   < > 62*  --  55*  --   --   --   BUN 26*   < > 30*  --  21  --   --   --   CREATININE 1.86*   < > 2.20*  --  1.67*  --   --   --   CALCIUM 5.6*  --   --   --  5.7*  --   --   --    < > = values in this interval not displayed.    PT/INR:  Recent Labs    01/13/20 0322  LABPROT 48.7*  INR 5.5*   ABG    Component Value Date/Time   PHART 7.240 (L) 01/13/2020 1140   HCO3 13.5 (L) 01/13/2020 1140   TCO2 14 (L) 01/13/2020 1140   ACIDBASEDEF 13.0 (H) 01/13/2020 1140   O2SAT 93.0 01/13/2020 1140   CBG (last 3)  Recent Labs    01/13/20 0925 01/13/20 1150 01/13/20 1214  GLUCAP 160* 59* 172*   CXR: mild interstitial edema.  Assessment/Plan: S/P Procedure(s) (LRB): STERNAL RE EXPLORATION WITH PUMP STANDBY, REMOVAL OF HEMATOMA (N/A) TRANSESOPHAGEAL ECHOCARDIOGRAM (TEE) (N/A) INSERTION OF DIALYSIS CATHETER USING TRIALYSIS 58F 20CM  Acute systolic CHF due to ischemic cardiomyopathy and postop cardiogenic shock requiring Impella. His CI, Co-ox and filling pressures look ok but he has refractory hypotension requiring very high dose vasopressors to maintain barely acceptable BP. Will markedly elevated PCT this looks like septic/vasodilatory shock of undetermined etiology. I think the only option at this point is to  continue maximal BP support as we are now and wait. Continue Maxipime and vanc.   AKI: Continue CRRT to keep even, remove K+ and acid.   Shock liver: Given FFP and vit K due to markedly elevated INR.  Severe lactic acidosis: May be due to septic shock, high vasopressor requirement. Ischemic gut always a possibility.   Marked thrombocytopenia: HIT negative. Likely due to inflammatory response and liver failure.  He remains critically ill with multi-system organ failure on maximal support. Discussed with daughter. She said she doesn't think he would want all this done and is going to talk to family about continuing present support vs comfort care only.   LOS: 12 days    Alleen Borne 01/13/2020

## 2020-01-13 NOTE — Progress Notes (Signed)
Spoke with Dr. Thedore Mins, notified regarding abnormal lab values, orders received.

## 2020-01-13 NOTE — Progress Notes (Signed)
Patient ID: Adrian Phillips, male   DOB: 11-13-1950, 69 y.o.   MRN: [id number]     Advanced Heart Failure Rounding Note  PCP-Cardiologist: Jenkins Rouge, MD   Subjective:    - 10/18: CABG (LIMA-LAD, SVG-OM1, SVG-dCFx) + Impella 5.5 - 10/21: S/P sternal wound wash out with removal of clot compressing RV. PEA arrest and started CVVHD  On amio 30, vasopressin 0.02 units, milrinone 0.3, norepi 30, epinephrine 10. CVVH running even, minimal UOP.  Still hyperkalemic with lactate 9.6.  INR 5.5, got 2.5 mg vitamin K and FFP this morning. PCT 24.   LFTs remain high but trending down.  Plts 54K, HIT negative.   Impella:  P8, good waveforms with no alarms.  Flow 4 L/min  LDH 236 => 1696 => 4234=>10000 => 9748  Swan numbers: CVP 7-8 PA 24/15 CI 2.1 Co-ox 72%  Cardiac MRI:  1. Moderate LVE with diffuse hypokinesis worse in the septum and apex EF 26% 2. Post gadolinium images with sub-endocardial scar in the mid distal anterior wall, septum and apex. No transmural or full thickness scar suggesting possibility of LVEF recovery with revascularization.    Objective:   Weight Range: 78.9 kg Body mass index is 24.96 kg/m.   Vital Signs:   Temp:  [95 F (35 C)-97.3 F (36.3 C)] 96.3 F (35.7 C) (10/23 0830) Pulse Rate:  [35-96] 87 (10/23 0830) Resp:  [18-24] 22 (10/23 0830) BP: (83-106)/(65-92) 83/65 (10/22 1500) SpO2:  [92 %-100 %] 94 % (10/23 0830) Arterial Line BP: (70-187)/(52-73) 99/57 (10/23 0830) FiO2 (%):  [50 %-70 %] 50 % (10/23 0740) Weight:  [78.9 kg] 78.9 kg (10/23 0500) Last BM Date: 12/27/2019  Weight change: Filed Weights   01/10/20 0630 01/12/20 0500 01/13/20 0500  Weight: 66 kg 74.7 kg 78.9 kg    Intake/Output:   Intake/Output Summary (Last 24 hours) at 01/13/2020 0846 Last data filed at 01/13/2020 0800 Gross per 24 hour  Intake 3630.51 ml  Output 2575 ml  Net 1055.51 ml      Physical Exam  CVP 7-8 General: Sedated on vent Neck: No JVD, no  thyromegaly or thyroid nodule.  Lungs: Decreased dependently CV: Nondisplaced PMI.  Heart regular S1/S2, no S3/S4, no murmur.  No peripheral edema.   Abdomen: Soft, nontender, no hepatosplenomegaly, no distention.  Skin: Intact without lesions or rashes.  Neurologic: Sedated on vent.  Extremities: No clubbing or cyanosis.  HEENT: Normal.    Telemetry   A paced 80 (personally reviewed)    Labs    CBC Recent Labs    01/12/20 0420 01/12/20 0436 01/12/20 1924 01/13/20 0351  WBC 13.0*  --  16.9*  --   NEUTROABS 10.3*  --   --   --   HGB 12.4*   < > 13.1 12.2*  HCT 36.1*   < > 40.1 36.0*  MCV 88.0  --  91.6  --   PLT 72*  --  54*  --    < > = values in this interval not displayed.   Basic Metabolic Panel Recent Labs    01/12/20 0233 01/12/20 0436 01/12/20 1628 01/12/20 1639 01/12/20 1727 01/12/20 1934 01/13/20 0322 01/13/20 0351  NA 139   < > 136   < > 135   < > 134* 133*  K 6.4*   < > 5.7*   < > 5.6*   < > 6.3* 6.3*  CL 98   < > 97*   < > 97*  --  101  --   CO2 26   < > 21*  --   --   --  17*  --   GLUCOSE 97   < > 72   < > 62*  --  55*  --   BUN 36*   < > 26*   < > 30*  --  21  --   CREATININE 2.21*   < > 1.86*   < > 2.20*  --  1.67*  --   CALCIUM 5.8*   < > 5.6*  --   --   --  5.7*  --   MG 2.9*  --   --   --   --   --  2.6*  --   PHOS 10.2*  --  8.2*  --   --   --   --   --    < > = values in this interval not displayed.   Liver Function Tests Recent Labs    01/12/20 0854 01/12/20 0854 01/12/20 1628 01/13/20 0322  AST >10,000*  --   --  8,659*  ALT 4,429*  --   --  3,723*  ALKPHOS 178*  --   --  253*  BILITOT 7.0*  --   --  7.3*  PROT 4.1*  --   --  3.5*  ALBUMIN 2.3*   < > 2.2* 1.9*   < > = values in this interval not displayed.   No results for input(s): LIPASE, AMYLASE in the last 72 hours. Cardiac Enzymes No results for input(s): CKTOTAL, CKMB, CKMBINDEX, TROPONINI in the last 72 hours.  BNP: BNP (last 3 results) Recent Labs     01/10/2020 0151  BNP 269.4*    ProBNP (last 3 results) No results for input(s): PROBNP in the last 8760 hours.   D-Dimer No results for input(s): DDIMER in the last 72 hours. Hemoglobin A1C No results for input(s): HGBA1C in the last 72 hours. Fasting Lipid Panel No results for input(s): CHOL, HDL, LDLCALC, TRIG, CHOLHDL, LDLDIRECT in the last 72 hours. Thyroid Function Tests No results for input(s): TSH, T4TOTAL, T3FREE, THYROIDAB in the last 72 hours.  Invalid input(s): FREET3  Other results:   Imaging    DG Chest Port 1 View  Result Date: 01/13/2020 CLINICAL DATA:  LVAD EXAM: PORTABLE CHEST 1 VIEW COMPARISON:  Yesterday FINDINGS: Endotracheal tube with tip at the clavicular heads. The enteric tube reaches the stomach. Left ventricular assist device in stable position. Swan-Ganz catheter from the right with tip at the right main pulmonary artery. Chest tubes in place with no visible pneumothorax. Right-sided PICC and dialysis catheter in unremarkable position. Mild interstitial edema is non progressed. Stable heart size. CABG. IMPRESSION: Stable hardware positioning and interstitial edema. No visible pneumothorax. Electronically Signed   By: Monte Fantasia M.D.   On: 01/13/2020 08:42     Medications:     Scheduled Medications: . sodium chloride   Intravenous Once  . sodium chloride   Intravenous Once  . sodium chloride   Intravenous Once  . acetaminophen  1,000 mg Per Tube Q6H   Or  . acetaminophen (TYLENOL) oral liquid 160 mg/5 mL  1,000 mg Per Tube Q6H  . aspirin EC  325 mg Oral Daily   Or  . aspirin  324 mg Per Tube Daily  . bisacodyl  10 mg Oral Daily   Or  . bisacodyl  10 mg Rectal Daily  . chlorhexidine gluconate (MEDLINE KIT)  15 mL Mouth  Rinse BID  . Chlorhexidine Gluconate Cloth  6 each Topical Daily  . docusate  200 mg Per Tube Daily  . fentaNYL (SUBLIMAZE) injection  25 mcg Intravenous Once  . hydrocortisone sod succinate (SOLU-CORTEF) inj  50 mg  Intravenous Q6H  . influenza vaccine adjuvanted  0.5 mL Intramuscular Tomorrow-1000  . insulin aspart  0-24 Units Subcutaneous Q4H  . ipratropium-albuterol  3 mL Nebulization Q6H  . mouth rinse  15 mL Mouth Rinse 10 times per day  . polyethylene glycol  17 g Per Tube Daily  . sodium chloride flush  10-40 mL Intracatheter Q12H  . sodium chloride flush  3 mL Intravenous Q12H  . sodium zirconium cyclosilicate  10 g Oral TID  . thiamine  100 mg Per Tube Daily   Or  . thiamine  100 mg Intravenous Daily    Infusions: . sodium chloride Stopped (01/12/20 2100)  . sodium chloride    . sodium chloride 20 mL/hr at 01/13/20 0700  . sodium chloride 10 mL/hr at 01/12/20 2102  . amiodarone 30 mg/hr (01/13/20 0800)  . anticoagulant sodium citrate    . bivalirudin (ANGIOMAX) infusion 0.5 mg/mL (Non-ACS indications) 0.01 mg/kg/hr (01/13/20 0800)  . ceFEPime (MAXIPIME) IV Stopped (01/13/20 0453)  . dextrose 5 % Impella 5.0 Purge solution    . epinephrine 10 mcg/min (01/13/20 0800)  . fentaNYL infusion INTRAVENOUS 100 mcg/hr (01/13/20 0800)  . milrinone 0.3 mcg/kg/min (01/13/20 0800)  . norepinephrine (LEVOPHED) Adult infusion 27 mcg/min (01/13/20 0800)  . phytonadione (VITAMIN K) IV    . prismasol BGK 2/2.5 dialysis solution 1,500 mL/hr at 01/13/20 0724  . prismasol BGK 2/2.5 replacement solution 500 mL/hr at 01/13/20 0723  . prismasol BGK 2/2.5 replacement solution 300 mL/hr at 01/13/20 0227  . vancomycin Stopped (01/12/20 0911)  . vasopressin 0.02 Units/min (01/13/20 0800)    PRN Medications: sodium chloride, Place/Maintain arterial line **AND** sodium chloride, anticoagulant sodium citrate, fentaNYL, midazolam, midazolam, ondansetron (ZOFRAN) IV, sodium chloride, sodium chloride flush, sodium chloride flush     Assessment/Plan   1. CAD: NSTEMI with cath showing totally occluded RCA with collaterals from LAD, 95% proximal LAD, 80% OM1 and 80% OM2.  Suspect he would be best-served by CABG.   Cardiac MRI showed significant viability, would expect to see improvement in LV function with revascularization. Now s/p CABG with LIMA-LAD, SVG-OM1, SVG-dCFX.  - Continue ASA 325 daily.  - Hold statin while LFTs markedly elevated.  2. Acute systolic CHF/post-op cardiogenic shock: Ischemic cardiomyopathy.  Echo this admission with EF < 20%, normal RV.  RHC showed CI mildly decreased at 1.93 with mildly elevated PCWP and normal RA pressure.  Pre-op cardiac MRI shows significant viability, would expect to see improved LV function with revascularization.  Now post-CABG with Impella 5.5 in place functioning normally. 10/21 to OR for chest washout, there was hematoma compressing RV.  Remains on vasopressin 0.02, NE 30, epinephrine 10, milrinone 0.3.  Co-ox 72% with CI 2.1 today, filling pressures not elevated.  Concern for component of septic/vasodilatory shock.  - Wean pressors as tolerated.  - Continue Impella P8 with good flow/no alarms.  On bivalirudin.  - Running CVVH even.  3. NSVT:  Amiodarone gtt.   4. COPD: Active smoker with significant COPD on CT.  He is on hydrocortisone 50 q6 hrs.  5. PAD: h/o right fem-pop bypass.  6. Carotid stenosis: Asymptomatic 60-79% RICA stenosis.  7. ID: Concern for component of septic shock. PCT 29 => 24. Cultures so far NGTD.  -  On cefepime + vancomycin, continue.  8. Anemia: Post-op blood loss. Transfuse Hgb < 8.  9. Acute hypoxemic respiratory failure: Intubated. No definite PNA.  - CCM following.  10. AKI: Started CVVH 10/21.  Filling pressures are normal with CVP 7-8, would run CVVH even today with high pressor requirement.  11. PEA Arrest: 10/21 post-OR for chest washout.  12. Hyperkalemia: Persistent.  On CVVH and getting Lokelma today.  13. Elevated LFTs: Marked elevation, suspect shock liver. Trending down.  14. Lactic Acidosis: Persistent.  ?Underlying septic shock, ?ischemic bowel.  Supportive care as above.  15. Thrombocytopenia: Suspect inflammatory  response.  HIT negative.  16. Coagulopathy: INR 5.5, due to shock liver.  - Got vitamin K and FFP today.  17. Left hemothorax: Chest tube in place.   CRITICAL CARE Performed by: Loralie Champagne  Total critical care time: 40 minutes  Critical care time was exclusive of separately billable procedures and treating other patients.  Critical care was necessary to treat or prevent imminent or life-threatening deterioration.  Critical care was time spent personally by me on the following activities: development of treatment plan with patient and/or surrogate as well as nursing, discussions with consultants, evaluation of patient's response to treatment, examination of patient, obtaining history from patient or surrogate, ordering and performing treatments and interventions, ordering and review of laboratory studies, ordering and review of radiographic studies, pulse oximetry and re-evaluation of patient's condition.  Loralie Champagne 01/13/2020 8:46 AM

## 2020-01-13 NOTE — Progress Notes (Signed)
CRITICAL VALUE ALERT  Critical Value:  Ca 5.9, K 6.6  Date & Time Notied:  18:05  Provider Notified: Arta Silence, MD  Orders Received/Actions taken: Awaiting new orders

## 2020-01-13 NOTE — Progress Notes (Signed)
Several areas on patient's arms, bilateral legs, groin, penis and scrotum noted to have blisters forming,  With several that have popped and weeping.  Covered open blisters with xeroform.    Patient continues to have periods of intermittent loss of pulsatility, but no alarms noted on Impella.  Spoke with Impella rep, Marchelle Folks, with regard to theses changes.  During these episodes blood pressure remains unchanged, but noted that patient had some ventricular rhythm changes, not sustained. No drop in purge flow noted during these episodes.  Changed pacer to AAI helped to resolve some of the loss of pulsatility.   Will continue to monitor.

## 2020-01-13 NOTE — Progress Notes (Addendum)
ANTICOAGULATION CONSULT NOTE   Pharmacy Consult for bivalirudin Indication: Impella  Allergies  Allergen Reactions  . Lisinopril Swelling    Angioedema in 2020    Patient Measurements: Height: 5\' 10"  (177.8 cm) Weight: 78.9 kg (173 lb 15.1 oz) IBW/kg (Calculated) : 73 Heparin Dosing Weight: 56kg  Vital Signs: Temp: 96.3 F (35.7 C) (10/23 0830) Pulse Rate: 87 (10/23 0830)  Labs: Recent Labs    01/10/20 1622 01/10/20 1644 01/16/2020 1221 16-Jan-2020 1240 Jan 16, 2020 2031 01/12/20 0233 01/12/20 0420 01/12/20 0436 01/12/20 0826 01/12/20 0854 01/12/20 1125 01/12/20 1628 01/12/20 1639 01/12/20 1727 01/12/20 1727 01/12/20 1924 01/13/20 0322 01/13/20 0351  HGB  --    < > 10.8*   < > 11.9*  --  12.4*   < >  --   --   --   --    < > 13.9   < > 13.1  --  12.2*  HCT  --    < > 33.7*   < > 35.4*  --  36.1*   < >  --   --   --   --    < > 41.0  --  40.1  --  36.0*  PLT  --    < > 47*   < > 104*  --  72*  --   --   --   --   --   --   --   --  54*  --   --   APTT  --    < > 68*   < >  --    < >  --    < >  --   --  57* 58*  --   --   --   --  60*  --   LABPROT  --   --  38.3*  --   --   --   --   --  33.8*  --   --   --   --   --   --   --  48.7*  --   INR  --   --  4.1*  --   --   --   --   --  3.5*  --   --   --   --   --   --   --  5.5*  --   HEPARINUNFRC <0.10*  --   --   --   --   --   --   --   --   --   --   --   --   --   --   --   --   --   CREATININE  --    < > 3.03*   < >  --    < >  --   --   --    < >  --  1.86*  --  2.20*  --   --  1.67*  --    < > = values in this interval not displayed.    Estimated Creatinine Clearance: 43.1 mL/min (A) (by C-G formula based on SCr of 1.67 mg/dL (H)).   Medical History: Past Medical History:  Diagnosis Date  . Diverticulosis   . Hyperlipidemia   . Hypertension   . Internal hemorrhoids   . PVD (peripheral vascular disease) (HCC)     Assessment: 34 yoM admitted with NSTEMI started on IV heparin >s/p CABG with Impella  placement. Pharmacy following  along anticoagulation plans. Patient had complicated post op course with drop in hgb and return to OR 10/21 for washout.  PLTC dropped 200 preop to 30 10/21. HIT panel sent and pt started on bivalirudin with D5W purge alone.  aPTT 60 seconds this morning, CT output ongoing, INR 5.5 likely due to mix of bivalirudin and shocked liver. FFP and vitamin K ordered, pltc remains low at 40k. HIT antibody negative. Discussed with CVTS and HF - will keep bivalirudin for now with low platelets despite HIT screen negative.  Goal of Therapy:  APTT ~50 seconds Monitor platelets by anticoagulation protocol: Yes   Plan:  Continue D5W purge  Decrease bivalirudin 0.008 mg/kg/h Check aPTT in 4h  ADDENDUM: Repeat aPTT 57 seconds, will drop bivalirudin slightly.  Plan: Reduce bivalirudin to 0.007 mg/kg/h Recheck aPTT in ~4h   Fredonia Highland, PharmD, BCPS Clinical Pharmacist [phone] Please check AMION for all [facility] Pharmacy numbers 01/13/2020

## 2020-01-13 NOTE — Progress Notes (Signed)
CRITICAL VALUE ALERT  Critical Value:  k- 6.5  Date & Time Notied:  01/13/20 2030  Provider Notified: Arlean Hopping  Orders Received/Actions taken: no new orders

## 2020-01-13 NOTE — Progress Notes (Signed)
Patient ID: TALTON DELPRIORE, male   DOB: 04/25/1950, 69 y.o.   MRN: [id number] TCTS Evening Rounds:  BP better this evening with MAP 70's, 119/66 PA 23/15 CI 2.3 Co-ox 65%  NE 30 Epi 10  Milrinone 0.3 Vaso 0.04 Impella P8 with stable flow 3.8  Persistent severe acidosis with BE -12, 7.24. sats 93% K+ 6.5 Hypoglycemic today requiring D50. Ionized calcium 0.83 Hgb 10.2  CRRT running positive  Family at bedside.

## 2020-01-13 NOTE — Progress Notes (Signed)
Patient ID: Adrian Phillips, male   DOB: 08/18/1950, 69 y.o.   MRN: [id number] TCTS:  I discussed patient's critical state again with daughter at the bedside. She is in agreement with do not resuscitate order in case he develops loss of rhythm or BP. Otherwise we will continue present support for now.

## 2020-01-13 NOTE — Progress Notes (Signed)
Pt's o2 sats reading 44% despite multiple changes of o2 probes and locati0ons. Respiratory at bedsiode. ABG gotten to verify o2 sats ok. O2 on ABG reading 93%. Will watch closely.  Delories Heinz, RN

## 2020-01-13 NOTE — Progress Notes (Signed)
NAME:  Adrian Phillips, MRN:  [id number], DOB:  1950/11/05, LOS: 12 ADMISSION DATE:  01/17/2020, CONSULTATION DATE:  01/10/20 REFERRING MD:  Marca Ancona, MD CHIEF COMPLAINT:  Acute hypoxemic respiratory failure  Brief History   69 year old male with emphysema and CAD admitted for NSTEMI and heart failure. Underwent CABG and Impella placement complicated by acute blood loss anemia secondary to left hemothorax s/p chest tube. PCCM consulted for hypoxemia  10/21 Underwent mediastinal reexploration with Dr. Morton Peters with significant clot seen on anterior mediastinal drain with brief PEA arrest after felt secondary to significant hyperkalemia with K 7.5 Received ACLS protocol with ROSC. Marland Kitchen Emergently started on CRRT  History of present illness   69 year old male active smoker who presented with shortness of breath and lower extremity edema and found with NSTEMI. Admitted for acute heart failure exacerbation/cardiogenic shock and NSTEMI. Cardiac cath showed occluded RCA, 95% pLAD, 80% OM1 and 80% OM2. Echo showed EF <20%. Underwent CABG and Impella placement complicated by acute blood loss anemia secondary to left hemothorax s/p chest tube.Underwent CABG and required Impella placement on 10/18. Extubated on 10/19. Despite diuresis and hemothorax improving, patient with persistent hypoxemia, PCCM consulted.  Patient and daughter provides history. He is an active smoker since 69 years old, smoking at least 1/2ppd. He works full time and denies any limitation in activity. At baseline he reports chronic cough with occasional sputum production and shortness of breath with heavy exertion. Occasionally wheezes. He does not use any inhalers at home. Has never been told he has COPD. He has never needed oxygen at home. He currently has fatigue, shortness of breath and wheezing.  Past Medical History  Emphysema CAD s/p CABG HTN HLD NSVT PAD s/p right fem-pop bypass Carotid stenosis  Significant  Hospital Events   10/18 CABG and Impellaplacement 10/21 Rexploration of mediastinum   Consults:  PCCM Card  Procedures:  10/12 LHC 10/18 CABG, Impella  Significant Diagnostic Tests:  CT Chest 01/04/20 - Moderate paraseptal and centrilobular emphysema  CXR 01/10/20 - Pulmonary edema and small bilateral pleural effusions, LVAD, Swan-Ganz, PICC, s/pt left chest tube x 2  Micro Data:  COVID 10/11 > Negative  MRSA PCR 10/11 > Negative  Blood culture 10/21 >  Antimicrobials:  Cefepime 10/21 > Vancomycin 10/21 >   Interim history/subjective:   Patient remains critically ill intubated on mechanical life support, Impella, CVVHD.  Objective   Blood pressure (!) 83/65, pulse 89, temperature (!) 96.3 F (35.7 C), resp. rate (!) 22, height  (1.778 m), weight 78.9 kg, SpO2 92 %. PAP: (16-29)/(8-16) 24/14 CVP:  [0 mmHg-13 mmHg] 10 mmHg PCWP:  [24 mmHg-25 mmHg] 25 mmHg CO:  [3.5 L/min-4.6 L/min] 3.5 L/min CI:  [2 L/min/m2-2.7 L/min/m2] 2 L/min/m2  Vent Mode: PRVC FiO2 (%):  [50 %-70 %] 50 % Set Rate:  [20 bmp] 20 bmp Vt Set:  [580 mL] 580 mL PEEP:  [8 cmH20] 8 cmH20 Plateau Pressure:  [27 cmH20-31 cmH20] 31 cmH20   Intake/Output Summary (Last 24 hours) at 01/13/2020 0913 Last data filed at 01/13/2020 0900 Gross per 24 hour  Intake 3493.77 ml  Output 2579 ml  Net 914.77 ml   Filed Weights   01/10/20 0630 01/12/20 0500 01/13/20 0500  Weight: 66 kg 74.7 kg 78.9 kg    Physical Exam: General: Chronically ill-appearing elderly male intubated on mechanical life support HEENT: Endotracheal tube in place, does not open eyes to voice or stimulation Neuro: Sedated on mechanical support,  I can get him to withdrawal to pain CV: Regular rhythm, S1-S2 PULM: Bilateral mechanically ventilated breath sounds GI: Soft nontender nondistended Extremities: No significant edema Skin: No obvious rash  Resolved Hospital Problem list     Assessment & Plan:   Acute hypoxemic  respiratory failure secondary COPD exacerbation and acute pulmonary edema, post PEA cardiac arrest -Moderate paraseptal and centrilobular emphysema with superimposed pulmonary edema on chest imaging. ABG reviewed. CVP 4-5. P:  Patient remains on full mechanical vent support Continue lung protective ventilation strategy. Wean PEEP and FiO2 to maintain sats greater than 90%. Head of bed elevated greater than 30 degrees Monitor plateau pressures Intermittent chest x-ray as needed SAT SBT as tolerated. PAD protocol for sedations. Continue steroids Continue as needed bronchodilators.  Cardiogenic shock secondary to NSTEMI  -S/p 3v CABG  Acute systolic CHF/postop cardiogenic shock  -ECHO with EF 20% Postoperative tamponade status post reexploration 10/21 NSVT Worsening multiorgan failure secondary to above. P: Continue inotropic support with milrinone, vasopressor support with epinephrine Levophed and vasopressin. Follow Cholox Patient currently on Impella P8. Continue amiodarone for arrhythmias. Remains on full dose anticoagulation with heparin.  PEA arrest -Likely secondary to severe hyperkalemia. -Postoperatively patient experienced a brief PEA arrest, received several rounds of epinephrine, calcium chloride, bicarb, insulin/dextrose.  He also received Lokelma and was immediately started on CRRT P: Continue CVVHD. Unfortunately this has been minimally responsive to continuous dialysis. Discussed with nephrology. Additional dose of Lokelma this morning.  AKI with oliguria Hyperkalemia -Potassium peaked at 7.5 resulting in short PEA arrest P: CVVHD per nephrology.  ABLA secondary to left hemothorax  -Has received 5Plt, 7FFP, 12PRBC, 4cryo Thrombocytopenia P: Hemoglobin stable. No additional blood products at this time. Continue to monitor H&H.  Severe transaminitis -Shock liver secondary to hypovolemia and cardiogenic shock P: Related to shock state. Continue to  follow.  Tobacco abuse P: Cessation education will be provided once appropriate  Hypoglycemia P: Likely related to hepatic dysfunction. As needed D50 pushes. . Prognosis, poor Worsening multiorgan failure. Plan: Discussed case with Dr. Laneta Simmers from cardiothoracic surgery. He agrees and will plan to discuss goals of care with patient's family at bedside. Would agree with DNR status or even considerations for palliation.  Best practice:  Diet: Per primary Pain/Anxiety/Delirium protocol (if indicated): Per primary VAP protocol (if indicated): NA DVT prophylaxis: Heparin GI prophylaxis: Per primary Glucose control: Per primary Mobility: As tolerated Code Status: Full Family Communication: Per primary  Disposition: Per primary  Labs   CBC: Recent Labs  Lab 12/28/2019 1221 01/12/2020 1240 12/27/2019 1526 12/30/2019 1638 01/17/2020 2031 01/04/2020 2031 01/12/20 0420 01/12/20 0420 01/12/20 0436 01/12/20 1639 01/12/20 1727 01/12/20 1924 01/13/20 0351  WBC 11.5*  --  10.3  --  12.7*  --  13.0*  --   --   --   --  16.9*  --   NEUTROABS  --   --   --   --   --   --  10.3*  --   --   --   --   --   --   HGB 10.8*   < > 8.5*   < > 11.9*   < > 12.4*   < > 11.6* 14.6 13.9 13.1 12.2*  HCT 33.7*   < > 26.3*   < > 35.4*   < > 36.1*   < > 34.0* 43.0 41.0 40.1 36.0*  MCV 93.1  --  94.6  --  89.6  --  88.0  --   --   --   --  91.6  --   PLT 47*  --  114*  --  104*  --  72*  --   --   --   --  54*  --    < > = values in this interval not displayed.    Basic Metabolic Panel: Recent Labs  Lab 01/16/2020 2122 12/24/2019 2127 01/09/20 0502 01/09/20 0607 01/09/20 1738 01/09/20 1743 01/10/20 1644 01/10/20 2238 12/26/2019 1635 01/15/2020 1638 01/12/20 0233 01/12/20 0436 01/12/20 0854 01/12/20 1125 01/12/20 1628 01/12/20 1628 01/12/20 1639 01/12/20 1639 01/12/20 1727 01/12/20 1934 01/12/20 2340 01/13/20 0322 01/13/20 0351  NA 139   < > 138   < > 143   < > 140   < > 142   < > 139   < >  137   < > 136  --  135  --  135  --   --  134* 133*  K 5.1   < > 5.3*   < > 5.5*   < > 4.8   < > >7.5*   < > 6.4*   < > 5.5*   < > 5.7*   < > 5.5*   < > 5.6* 5.6* 6.2* 6.3* 6.3*  CL 111   < > 110   < > 106   < > 106   < > 103   < > 98  --  95*  --  97*  --   --   --  97*  --   --  101  --   CO2 21*   < > 18*   < > 19*   < > 18*   < > 18*  --  26  --  26  --  21*  --   --   --   --   --   --  17*  --   GLUCOSE 155*   < > 130*   < > 145*   < > 133*   < > 159*   < > 97  --  91  --  72  --   --   --  62*  --   --  55*  --   BUN 14   < > 14   < > 22   < > 40*   < > 47*   < > 36*  --  30*  --  26*  --   --   --  30*  --   --  21  --   CREATININE 1.02   < > 1.15   < > 1.82*   < > 2.54*   < > 2.78*   < > 2.21*  --  2.02*  --  1.86*  --   --   --  2.20*  --   --  1.67*  --   CALCIUM 8.5*   < > 8.1*   < > 8.1*   < > 7.7*   < > 8.3*  --  5.8*  --  5.4*  --  5.6*  --   --   --   --   --   --  5.7*  --   MG 2.6*  --  2.1  --  2.1  --   --   --   --   --  2.9*  --   --   --   --   --   --   --   --   --   --  2.6*  --   PHOS  --   --   --   --   --   --  6.1*  --  >30.0*  --  10.2*  --   --   --  8.2*  --   --   --   --   --   --   --   --    < > = values in this interval not displayed.   GFR: Estimated Creatinine Clearance: 43.1 mL/min (A) (by C-G formula based on SCr of 1.67 mg/dL (H)). Recent Labs  Lab 01/10/20 0426 01/10/20 0754 01/18/2020 0410 01/20/2020 0642 12/30/2019 1221 01/19/2020 1526 01/07/2020 1622 12/24/2019 2031 01/12/20 0233 01/12/20 0420 01/12/20 1628 01/12/20 1924 01/13/20 0322 01/13/20 0326  PROCALCITON 43.79  --   --  29.26  --   --   --   --  28.85  --   --   --  24.29  --   WBC 17.9*   < >  --   --    < > 10.3  --  12.7*  --  13.0*  --  16.9*  --   --   LATICACIDVEN  --    < >   < >  --   --   --  10.9*  --  4.9*  --  7.1*  --   --  9.6*   < > = values in this interval not displayed.    Liver Function Tests: Recent Labs  Lab 01/17/2020 0358 12/24/2019 0358 01/17/2020 1221  01/20/2020 1221 01/07/2020 1635 01/12/20 0233 01/12/20 0854 01/12/20 1628 01/13/20 0322  AST 5,615*  --  6,642*  --   --  >10,000* >10,000*  --  8,659*  ALT 3,466*  --  3,086*  --   --  5,067* 4,429*  --  3,723*  ALKPHOS 66  --  121  --   --  182* 178*  --  253*  BILITOT 2.7*  --  3.2*  --   --  6.8* 7.0*  --  7.3*  PROT 4.0*  --  3.3*  --   --  4.5* 4.1*  --  3.5*  ALBUMIN 2.4*   < > 2.0*   < > 2.3* 2.6* 2.3* 2.2* 1.9*   < > = values in this interval not displayed.   No results for input(s): LIPASE, AMYLASE in the last 168 hours. No results for input(s): AMMONIA in the last 168 hours.  ABG    Component Value Date/Time   PHART 7.341 (L) 01/13/2020 0351   PCO2ART 36.5 01/13/2020 0351   PO2ART 74 (L) 01/13/2020 0351   HCO3 20.1 01/13/2020 0351   TCO2 21 (L) 01/13/2020 0351   ACIDBASEDEF 6.0 (H) 01/13/2020 0351   O2SAT 95.0 01/13/2020 0351     Coagulation Profile: Recent Labs  Lab 12/30/2019 1502 01/16/2020 2211 01/15/2020 1221 01/12/20 0826 01/13/20 0322  INR 1.5* 1.4* 4.1* 3.5* 5.5*    Cardiac Enzymes: No results for input(s): CKTOTAL, CKMB, CKMBINDEX, TROPONINI in the last 168 hours.  HbA1C: Hgb A1c MFr Bld  Date/Time Value Ref Range Status  12/22/2019 05:53 AM 5.4 4.8 - 5.6 % Final    Comment:    (NOTE) Pre diabetes:          5.7%-6.4%  Diabetes:              >6.4%  Glycemic control for   <7.0% adults with diabetes     CBG: Recent Labs  Lab 01/12/20 1637 01/12/20 1943 01/12/20 2340 01/13/20 0348 01/13/20 0417  GLUCAP 74 85 76 54* 145*    This patient is critically ill with multiple organ system failure; which, requires frequent high complexity decision making, assessment, support, evaluation, and titration of therapies. This was completed through the application of advanced monitoring technologies and extensive interpretation of multiple databases. During this encounter critical care time was devoted to patient care services described in this note for 34  minutes.  Josephine Igo, DO [facility] 01/13/2020 9:13 AM

## 2020-01-13 NOTE — Progress Notes (Signed)
ANTICOAGULATION CONSULT NOTE   Pharmacy Consult for bivalirudin Indication: Impella  Allergies  Allergen Reactions  . Lisinopril Swelling    Angioedema in 2020    Patient Measurements: Height: 5\' 10"  (177.8 cm) Weight: 78.9 kg (173 lb 15.1 oz) IBW/kg (Calculated) : 73 Heparin Dosing Weight: 56kg  Vital Signs: Temp: 95.2 F (35.1 C) (10/23 1908) Pulse Rate: 40 (10/23 1620)  Labs: Recent Labs    27-Jan-2020 1221 Jan 27, 2020 1240 01/12/20 0420 01/12/20 0436 01/12/20 0826 01/12/20 0854 01/12/20 1628 01/12/20 1727 01/12/20 1727 01/12/20 1924 01/13/20 0322 01/13/20 0351 01/13/20 0820 01/13/20 0820 01/13/20 1140 01/13/20 1140 01/13/20 1349 01/13/20 1459 01/13/20 1659 01/13/20 1712 01/13/20 1813  HGB 10.8*   < > 12.4*   < >  --    < >  --  13.9   < > 13.1  --    < > 11.8*   < > 9.2*   < >  --  9.2*  --  10.2*  --   HCT 33.7*   < > 36.1*   < >  --    < >  --  41.0   < > 40.1  --    < > 38.4*   < > 27.0*  --   --  27.0*  --  30.0*  --   PLT 47*   < > 72*  --   --   --   --   --   --  54*  --   --  40*  --   --   --   --   --   --   --   --   APTT 68*   < >  --   --   --    < >   < >  --   --   --  60*  --   --   --   --   --  57*  --   --   --  58*  LABPROT 38.3*  --   --   --  33.8*  --   --   --   --   --  48.7*  --   --   --   --   --   --   --   --   --   --   INR 4.1*  --   --   --  3.5*  --   --   --   --   --  5.5*  --   --   --   --   --   --   --   --   --   --   CREATININE 3.03*   < >  --   --   --    < >  --  2.20*  --   --  1.67*  --   --   --   --   --   --   --  1.54*  --   --    < > = values in this interval not displayed.    Estimated Creatinine Clearance: 46.7 mL/min (A) (by C-G formula based on SCr of 1.54 mg/dL (H)).   Medical History: Past Medical History:  Diagnosis Date  . Diverticulosis   . Hyperlipidemia   . Hypertension   . Internal hemorrhoids   . PVD (peripheral vascular disease) (HCC)     Assessment: 44 yoM admitted with NSTEMI started  on IV heparin >  s/p CABG with Impella placement. Pharmacy following along anticoagulation plans. Patient had complicated post op course with drop in hgb and return to OR 10/21 for washout.  PLTC dropped 200 preop to 30 10/21. HIT panel sent and pt started on bivalirudin with D5W purge alone.  CT output ongoing, INR 5.5 likely due to mix of bivalirudin and shocked liver. FFP and vitamin K received, pltc remains low at 40k. HIT antibody negative. Discussed with CVTS and HF - will keep bivalirudin for now with low platelets despite HIT screen negative.  aPTT 58 seconds this evening, no issues with impella flow/purge pressure, has slight pink tinged sputum per nursing (will monitor closely). No infusion issues.   Goal of Therapy:  APTT ~50 seconds Monitor platelets by anticoagulation protocol: Yes   Plan:  Continue D5W purge  Decrease bivalirudin 0.006 mg/kg/h Check aPTT in 4h  Sherron Monday, PharmD, BCCCP Clinical Pharmacist  Phone: [phone] 01/13/2020 7:29 PM  Please check AMION for all [facility] Pharmacy phone numbers After 10:00 PM, call Main Pharmacy [phone]

## 2020-01-13 NOTE — Progress Notes (Signed)
CRITICAL VALUE ALERT  Critical Value:  INR 5.5  Date & Time Notied:  01/13/2020@0658    Provider Notified:  pending  Orders Received/Actions taken: pending

## 2020-01-14 LAB — TYPE AND SCREEN
ABO/RH(D): O POS
Antibody Screen: NEGATIVE
Unit division: 0
Unit division: 0
Unit division: 0
Unit division: 0
Unit division: 0
Unit division: 0
Unit division: 0
Unit division: 0
Unit division: 0
Unit division: 0
Unit division: 0
Unit division: 0
Unit division: 0
Unit division: 0
Unit division: 0
Unit division: 0
Unit division: 0

## 2020-01-14 LAB — BPAM RBC
Blood Product Expiration Date: 202111202359
Blood Product Expiration Date: 202111202359
Blood Product Expiration Date: 202111202359
Blood Product Expiration Date: 202111232359
Blood Product Expiration Date: 202111232359
Blood Product Expiration Date: 202111232359
Blood Product Expiration Date: 202111232359
Blood Product Expiration Date: 202111232359
Blood Product Expiration Date: 202111232359
Blood Product Expiration Date: 202111232359
Blood Product Expiration Date: 202111232359
Blood Product Expiration Date: 202111232359
Blood Product Expiration Date: 202111232359
Blood Product Expiration Date: 202111232359
Blood Product Expiration Date: 202111232359
Blood Product Expiration Date: 202111232359
Blood Product Expiration Date: 202111232359
ISSUE DATE / TIME: 202110210642
ISSUE DATE / TIME: 202110210751
ISSUE DATE / TIME: 202110210751
ISSUE DATE / TIME: 202110210751
ISSUE DATE / TIME: 202110210751
ISSUE DATE / TIME: 202110211456
ISSUE DATE / TIME: 202110211456
ISSUE DATE / TIME: 202110211456
ISSUE DATE / TIME: 202110211456
ISSUE DATE / TIME: 202110211514
ISSUE DATE / TIME: 202110211514
ISSUE DATE / TIME: 202110211514
ISSUE DATE / TIME: 202110211514
ISSUE DATE / TIME: 202110212050
ISSUE DATE / TIME: 202110212050
ISSUE DATE / TIME: 202110221805
ISSUE DATE / TIME: 202110232021
Unit Type and Rh: 5100
Unit Type and Rh: 5100
Unit Type and Rh: 5100
Unit Type and Rh: 5100
Unit Type and Rh: 5100
Unit Type and Rh: 5100
Unit Type and Rh: 5100
Unit Type and Rh: 5100
Unit Type and Rh: 5100
Unit Type and Rh: 5100
Unit Type and Rh: 5100
Unit Type and Rh: 5100
Unit Type and Rh: 5100
Unit Type and Rh: 5100
Unit Type and Rh: 5100
Unit Type and Rh: 5100
Unit Type and Rh: 5100

## 2020-01-14 LAB — PREPARE FRESH FROZEN PLASMA
Unit division: 0
Unit division: 0

## 2020-01-14 LAB — RENAL FUNCTION PANEL
Albumin: 1.6 g/dL — ABNORMAL LOW (ref 3.5–5.0)
Anion gap: 19 — ABNORMAL HIGH (ref 5–15)
BUN: 12 mg/dL (ref 8–23)
CO2: 18 mmol/L — ABNORMAL LOW (ref 22–32)
Calcium: 5.7 mg/dL — CL (ref 8.9–10.3)
Chloride: 95 mmol/L — ABNORMAL LOW (ref 98–111)
Creatinine, Ser: 1.15 mg/dL (ref 0.61–1.24)
GFR, Estimated: >60 mL/min (ref >60)
Glucose, Bld: 92 mg/dL (ref 70–99)
Phosphorus: 9.8 mg/dL — ABNORMAL HIGH (ref 2.5–4.6)
Potassium: 6.7 mmol/L (ref 3.5–5.1)
Sodium: 132 mmol/L — ABNORMAL LOW (ref 135–145)

## 2020-01-14 LAB — GLUCOSE, CAPILLARY
Glucose-Capillary: 189 mg/dL — ABNORMAL HIGH (ref 70–99)
Glucose-Capillary: 40 mg/dL — CL (ref 70–99)
Glucose-Capillary: 82 mg/dL (ref 70–99)
Glucose-Capillary: 205 mg/dL — ABNORMAL HIGH (ref 70–99)
Glucose-Capillary: 46 mg/dL — ABNORMAL LOW (ref 70–99)

## 2020-01-14 LAB — POCT I-STAT 7, (LYTES, BLD GAS, ICA,H+H)
Acid-base deficit: 5 mmol/L — ABNORMAL HIGH (ref 0.0–2.0)
Bicarbonate: 20.6 mmol/L (ref 20.0–28.0)
Calcium, Ion: 0.72 mmol/L — CL (ref 1.15–1.40)
HCT: 31 % — ABNORMAL LOW (ref 39.0–52.0)
Hemoglobin: 10.5 g/dL — ABNORMAL LOW (ref 13.0–17.0)
O2 Saturation: 94 %
Patient temperature: 34
Potassium: 6.2 mmol/L — ABNORMAL HIGH (ref 3.5–5.1)
Sodium: 129 mmol/L — ABNORMAL LOW (ref 135–145)
TCO2: 22 mmol/L (ref 22–32)
pCO2 arterial: 32.8 mmHg (ref 32.0–48.0)
pH, Arterial: 7.393 (ref 7.350–7.450)
pO2, Arterial: 61 mmHg — ABNORMAL LOW (ref 83.0–108.0)

## 2020-01-14 LAB — COMPREHENSIVE METABOLIC PANEL
ALT: 2786 U/L — ABNORMAL HIGH (ref 0–44)
AST: 7023 U/L — ABNORMAL HIGH (ref 15–41)
Albumin: 1.5 g/dL — ABNORMAL LOW (ref 3.5–5.0)
Alkaline Phosphatase: 530 U/L — ABNORMAL HIGH (ref 38–126)
Anion gap: 22 — ABNORMAL HIGH (ref 5–15)
BUN: 12 mg/dL (ref 8–23)
CO2: 16 mmol/L — ABNORMAL LOW (ref 22–32)
Calcium: 5.7 mg/dL — CL (ref 8.9–10.3)
Chloride: 93 mmol/L — ABNORMAL LOW (ref 98–111)
Creatinine, Ser: 1.23 mg/dL (ref 0.61–1.24)
GFR, Estimated: >60 mL/min (ref >60)
Glucose, Bld: 93 mg/dL (ref 70–99)
Potassium: 6.7 mmol/L (ref 3.5–5.1)
Sodium: 131 mmol/L — ABNORMAL LOW (ref 135–145)
Total Bilirubin: 6.7 mg/dL — ABNORMAL HIGH (ref 0.3–1.2)
Total Protein: 3.1 g/dL — ABNORMAL LOW (ref 6.5–8.1)

## 2020-01-14 LAB — BPAM FFP
Blood Product Expiration Date: 202110232359
Blood Product Expiration Date: 202110232359
ISSUE DATE / TIME: 202110230832
ISSUE DATE / TIME: 202110230832
Unit Type and Rh: 600
Unit Type and Rh: 600

## 2020-01-14 LAB — POTASSIUM
Potassium: 6.5 mmol/L (ref 3.5–5.1)
Potassium: 6.8 mmol/L (ref 3.5–5.1)

## 2020-01-14 LAB — APTT
aPTT: 65 seconds — ABNORMAL HIGH (ref 24–36)
aPTT: 68 seconds — ABNORMAL HIGH (ref 24–36)

## 2020-01-14 LAB — CULTURE, RESPIRATORY W GRAM STAIN
Culture: NORMAL
Special Requests: NORMAL

## 2020-01-14 LAB — PROTIME-INR
INR: 3.9 — ABNORMAL HIGH (ref 0.8–1.2)
Prothrombin Time: 37.3 seconds — ABNORMAL HIGH (ref 11.4–15.2)

## 2020-01-14 LAB — MAGNESIUM: Magnesium: 2.7 mg/dL — ABNORMAL HIGH (ref 1.7–2.4)

## 2020-01-14 LAB — COOXEMETRY PANEL
Carboxyhemoglobin: 0.7 % (ref 0.5–1.5)
Methemoglobin: 1 % (ref 0.0–1.5)
O2 Saturation: 65.2 %
Total hemoglobin: 9.9 g/dL — ABNORMAL LOW (ref 12.0–16.0)

## 2020-01-14 LAB — LACTATE DEHYDROGENASE: LDH: 9601 U/L — ABNORMAL HIGH (ref 98–192)

## 2020-01-14 MED ORDER — DEXTROSE 50 % IV SOLN: 25.0000 g | INTRAVENOUS | Status: AC

## 2020-01-14 MED ORDER — HALOPERIDOL LACTATE 5 MG/ML IJ SOLN: 0.5000 mg | INTRAMUSCULAR | Status: DC | PRN

## 2020-01-14 MED ORDER — STERILE WATER FOR INJECTION IV SOLN
INTRAVENOUS | Status: DC
  Filled 2020-01-14 (×4): qty 150

## 2020-01-14 MED ORDER — GLYCOPYRROLATE 0.2 MG/ML IJ SOLN: 0.2000 mg | INTRAMUSCULAR | Status: DC | PRN

## 2020-01-14 MED ORDER — HALOPERIDOL LACTATE 2 MG/ML PO CONC
0.5000 mg | ORAL | Status: DC | PRN
  Filled 2020-01-14: qty 0.3

## 2020-01-14 MED ORDER — DEXTROSE 50 % IV SOLN
INTRAVENOUS | Status: AC
  Administered 2020-01-14: 50 mL
  Filled 2020-01-14: qty 50

## 2020-01-14 MED ORDER — MORPHINE SULFATE (PF) 2 MG/ML IV SOLN: 1.0000 mg | INTRAVENOUS | Status: DC | PRN

## 2020-01-14 MED ORDER — GLYCOPYRROLATE 1 MG PO TABS
1.0000 mg | ORAL_TABLET | ORAL | Status: DC | PRN
  Filled 2020-01-14: qty 1

## 2020-01-14 MED ORDER — HALOPERIDOL 0.5 MG PO TABS
0.5000 mg | ORAL_TABLET | ORAL | Status: DC | PRN
  Filled 2020-01-14: qty 1

## 2020-01-14 MED ORDER — ONDANSETRON HCL 4 MG/2ML IJ SOLN: 4.0000 mg | Freq: Four times a day (QID) | INTRAMUSCULAR | Status: DC | PRN

## 2020-01-14 MED ORDER — DEXTROSE 10 % IV SOLN: INTRAVENOUS | Status: DC

## 2020-01-14 MED ORDER — LORAZEPAM 2 MG/ML PO CONC: 1.0000 mg | ORAL | Status: DC | PRN

## 2020-01-14 MED ORDER — ACETAMINOPHEN 325 MG PO TABS: 650.0000 mg | ORAL_TABLET | Freq: Four times a day (QID) | ORAL | Status: DC | PRN

## 2020-01-14 MED ORDER — BIOTENE DRY MOUTH MT LIQD: 15.0000 mL | OROMUCOSAL | Status: DC | PRN

## 2020-01-14 MED ORDER — DEXTROSE 50 % IV SOLN
25.0000 g | INTRAVENOUS | Status: AC
  Administered 2020-01-14: 25 g via INTRAVENOUS

## 2020-01-14 MED ORDER — ONDANSETRON 4 MG PO TBDP
4.0000 mg | ORAL_TABLET | Freq: Four times a day (QID) | ORAL | Status: DC | PRN
  Filled 2020-01-14: qty 1

## 2020-01-14 MED ORDER — ACETAMINOPHEN 650 MG RE SUPP: 650.0000 mg | Freq: Four times a day (QID) | RECTAL | Status: DC | PRN

## 2020-01-14 MED ORDER — LORAZEPAM 1 MG PO TABS: 1.0000 mg | ORAL_TABLET | ORAL | Status: DC | PRN

## 2020-01-14 MED ORDER — LORAZEPAM 2 MG/ML IJ SOLN: 1.0000 mg | INTRAMUSCULAR | Status: DC | PRN

## 2020-01-14 MED ORDER — STERILE WATER FOR INJECTION IV SOLN
INTRAVENOUS | Status: DC
  Filled 2020-01-14 (×9): qty 150

## 2020-01-14 MED ORDER — POLYVINYL ALCOHOL 1.4 % OP SOLN
1.0000 [drp] | Freq: Four times a day (QID) | OPHTHALMIC | Status: DC | PRN
  Filled 2020-01-14: qty 15

## 2020-01-15 ENCOUNTER — Encounter (HOSPITAL_COMMUNITY): Payer: Self-pay | Admitting: Cardiothoracic Surgery

## 2020-01-15 LAB — SEROTONIN RELEASE ASSAY (SRA)
SRA .2 IU/mL UFH Ser-aCnc: 1 % (ref 0–20)
SRA 100IU/mL UFH Ser-aCnc: 2 % (ref 0–20)

## 2020-01-15 NOTE — Addendum Note (Signed)
Addendum  created 01/15/20 0805 by Adair Laundry, CRNA   Order list changed

## 2020-01-16 LAB — CULTURE, BLOOD (ROUTINE X 2)
Culture: NO GROWTH
Culture: NO GROWTH
Special Requests: ADEQUATE

## 2020-01-16 LAB — PATHOLOGIST SMEAR REVIEW

## 2020-01-16 SURGERY — REMOVAL, CARDIAC ASSIST DEVICE, IMPELLA
Anesthesia: General | Site: Chest

## 2020-01-16 MED FILL — Medication: Qty: 1 | Status: AC

## 2020-01-19 ENCOUNTER — Encounter (HOSPITAL_COMMUNITY): Payer: Self-pay | Admitting: *Deleted

## 2020-01-22 NOTE — Progress Notes (Signed)
CRITICAL VALUE ALERT  Critical Value:  k- 6.7 and Ionized Ca- 0.72  Date & Time Notied:  01/07/2020 0430  Provider Notified: Arlean Hopping  Orders Received/Actions taken: no new orders

## 2020-01-22 NOTE — Progress Notes (Signed)
eLink Physician-Brief Progress Note Patient Name: Adrian Phillips DOB: May 08, 1950 MRN: [id number]   Date of Service  01/09/2020  HPI/Events of Note  Family wants to discuss with bed side ICU team for withdrawal of care.  Course from 1th to now:  NSTEMI-CABG- PEA arrest-shock.    eICU Interventions  Notified Dr Joni Reining.      Intervention Category Minor Interventions: Other:  [name] 01/19/2020, 5:30 AM

## 2020-01-22 NOTE — Progress Notes (Signed)
ANTICOAGULATION CONSULT NOTE   Pharmacy Consult for Bivalirudin Indication: Impella  Allergies  Allergen Reactions  . Lisinopril Swelling    Angioedema in 2020    Patient Measurements: Height: 5\' 10"  (177.8 cm) Weight: 78.9 kg (173 lb 15.1 oz) IBW/kg (Calculated) : 73 Heparin Dosing Weight: 56kg  Vital Signs: Temp: 94.6 F (34.8 C) (10/24 0100) Temp Source: Core (10/24 0000) Pulse Rate: 40 (10/24 0100)  Labs: Recent Labs    12/22/2019 1221 01/10/2020 1240 01/12/20 0420 01/12/20 0436 01/12/20 0826 01/12/20 0854 01/12/20 1628 01/12/20 1727 01/12/20 1727 01/12/20 1924 01/13/20 0322 01/13/20 0351 01/13/20 0820 01/13/20 1140 01/13/20 1349 01/13/20 1459 01/13/20 1659 01/13/20 1712 01/13/20 1712 01/13/20 1813 01/13/20 1947 01/13/20 2323 01/13/20 2340  HGB 10.8*   < > 12.4*   < >  --    < >  --  13.9   < > 13.1  --    < > 11.8*   < >  --    < >  --  10.2*   < >  --  10.5* 10.9*  --   HCT 33.7*   < > 36.1*   < >  --    < >  --  41.0   < > 40.1  --    < > 38.4*   < >  --    < >  --  30.0*  --   --  31.0* 32.0*  --   PLT 47*   < > 72*  --   --   --   --   --   --  54*  --   --  40*  --   --   --   --   --   --   --   --   --   --   APTT 68*   < >  --   --   --    < >   < >  --   --   --  60*   < >  --   --  57*  --   --   --   --  58*  --   --  68*  LABPROT 38.3*  --   --   --  33.8*  --   --   --   --   --  48.7*  --   --   --   --   --   --   --   --   --   --   --   --   INR 4.1*  --   --   --  3.5*  --   --   --   --   --  5.5*  --   --   --   --   --   --   --   --   --   --   --   --   CREATININE 3.03*   < >  --   --   --    < >  --  2.20*  --   --  1.67*  --   --   --   --   --  1.54*  --   --   --   --   --   --    < > = values in this interval not displayed.    Estimated Creatinine Clearance: 46.7 mL/min (A) (by C-G formula based on SCr of  1.54 mg/dL (H)).   Medical History: Past Medical History:  Diagnosis Date  . Diverticulosis   . Hyperlipidemia   .  Hypertension   . Internal hemorrhoids   . PVD (peripheral vascular disease) (HCC)     Assessment: 59 yoM admitted with NSTEMI started on IV heparin >s/p CABG with Impella placement. Pharmacy following along anticoagulation plans. Patient had complicated post op course with drop in hgb and return to OR 10/21 for washout.  PLTC dropped 200 preop to 30 10/21. HIT panel sent and pt started on bivalirudin with D5W purge alone.  CT output ongoing, INR 5.5 likely due to mix of bivalirudin and shocked liver. FFP and vitamin K received, pltc remains low at 40k. HIT antibody negative. Discussed with CVTS and HF - will keep bivalirudin for now with low platelets despite HIT screen negative.  aPTT 58 seconds this evening, no issues with impella flow/purge pressure, has slight pink tinged sputum per nursing (will monitor closely). No infusion issues.   10/24 AM update:  APTT 68  Goal of Therapy:  APTT ~50 seconds Monitor platelets by anticoagulation protocol: Yes   Plan:  Continue D5W purge  Decrease bivalirudin to 0.004 mg/kg/hr Check aPTT in 4h  Abran Duke, PharmD, BCPS Clinical Pharmacist Phone: [phone]

## 2020-01-22 NOTE — Progress Notes (Signed)
125 mL of fentanyl wasted in stericycle. Witnessed by Tammy Sours,. RN.

## 2020-01-22 NOTE — Progress Notes (Signed)
eLink Physician-Brief Progress Note Patient Name: Adrian Phillips DOB: 26-Apr-1950 MRN: [id number]   Date of Service  12/23/2019  HPI/Events of Note  Hypoglycemia. Cardiogenic shock On CRRT-bicarb.   eICU Interventions  Start D10 at 10 ml/hr. 2 hrly glucose     Intervention Category Intermediate Interventions: Other:  [name] 12/28/2019, 2:22 AM

## 2020-01-22 NOTE — Progress Notes (Signed)
eLink Physician-Brief Progress Note Patient Name: Adrian Phillips DOB: [birth year] MRN: [id number]   Date of Service  2020/02/07  HPI/Events of Note  Persisting hypoglycemia, elevated Liver functions . Recent CABG status.   eICU Interventions  D 10 increased to 20 ml/hr.      Intervention Category Intermediate Interventions: Other:  [name] 07-Feb-2020, 5:00 AM

## 2020-01-22 NOTE — Progress Notes (Signed)
eLink Physician-Brief Progress Note Patient Name: Adrian Phillips DOB: [birth year] MRN: [id number]   Date of Service  01/05/2020  HPI/Events of Note  RN discussed that family at bed side , wanting withdrawal of care.  He called Dr Laneta Simmers and he is in agreement for comfort care.  Asking for further comfort care order.  eICU Interventions  Talked to his daughter, Adrian Phillips ( eldest, mother deceased) over the phone and placing above comfort measures.      Intervention Category Intermediate Interventions: Communication with other healthcare providers and/or family  [name] 01/19/2020, 5:42 AM

## 2020-01-22 NOTE — Progress Notes (Signed)
°   01/10/2020 0621  Clinical Encounter Type  Visited With Patient and family together  Visit Type Patient actively dying;Death  Referral From Nurse  Consult/Referral To Chaplain  Spiritual Encounters  Spiritual Needs Grief support;Emotional;Prayer  Chaplain responded to the page for Adrian Phillips and Family.  Mr. Jamison was coming to end of life and family requested prayer. The nurse staff was so compassionate, patient and supported the family until there departure.  Chaplain offered emotional and grief support.  Chaplain Oaklen Thiam Morgan-Simpson [phone]

## 2020-01-22 NOTE — Progress Notes (Signed)
CRITICAL VALUE ALERT  Critical Value:  k- 6.5  Bicarb- 14.3   Date & Time Notied:  [place]  Provider Notified: 12/28/2019 0015  Orders Received/Actions taken: new orders received

## 2020-01-22 NOTE — Procedures (Signed)
Extubation Procedure Note  Patient Details:   Name: Adrian Phillips DOB: Jan 08, 1951 MRN: [id number]   Airway Documentation:  Airway 7.5 mm (Active)  Secured at (cm) 22 cm 12/28/2019 0358  Measured From Lips 01/18/2020 0358  Secured Location Left 01/03/2020 0358  Secured By [employer] 12/27/2019 0358  Tube Holder Repositioned Yes 01/02/2020 0358  Cuff Pressure (cm H2O) 30 cm H2O 01/13/20 0740  Site Condition Dry 01/03/2020 0358   Vent end date: 01/04/2020 Vent end time: 0632   Evaluation  O2 sats: currently acceptable Complications: No apparent complications Patient did tolerate procedure well. Bilateral Breath Sounds: Clear, Diminished   No  Rushie Chestnut [facility] 01/01/2020, 6:33 AM

## 2020-01-22 NOTE — Discharge Summary (Signed)
NAME: Adrian Phillips, Adrian Phillips MEDICAL RECORD [id number] ACCOUNT [id number] DATE OF BIRTH:[birth year] FACILITY: [facility] LOCATION: [facility] PHYSICIAN:Derron Pipkins VAN TRIGT III, MD  DISCHARGE SUMMARY  DATE OF DISCHARGE:  2020/02/11  DATE OF ADMISSION:  01/29/20   DATE OF DEATH:  2020-02-11  ADMISSION DIAGNOSES: 1.  Acute systolic heart failure with pulmonary edema and lower extremity edema. 2.  Non-ST elevation myocardial infarction. 3.  Acute respiratory failure requiring BiPAP and oxygen 4.  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 5.  History of active smoking and ETOH abuse. 6.  History of peripheral vascular disease, status post femoral popliteal bypass grafting with vein,  2005.  OPERATIONS AND PROCEDURES: 1.  Left and right heart catheterization, 01/09/2020. 2.  Cardiac MRI viability study on 01/03/2020. 3.  Coronary artery bypass grafting x3 with placement of percutaneous left ventricular assist device, Impella 5.5 on 01/04/2020. 4.  Mediastinal exploration and removal of mediastinal hematoma, 01/09/2020.  HOSPITAL COURSE:  The patient is a 69 year old male who was admitted to the ED with acute respiratory failure and positive cardiac enzymes.  He had a history of hypertension, peripheral vascular disease, active smoking and ETOH abuse.  He was found to  have low ejection fraction of 20% by echo with moderate to severe MR.  After diuresis, his symptoms improved and he underwent cardiac catheterization, which showed significant multivessel coronary artery disease with low EF.  He underwent a right heart  catheterization, which showed low cardiac output and moderately elevated filling pressures.  He was evaluated by the advanced heart failure team and placed on milrinone and diuresis and underwent a cardiac viability MRI.  This showed mixed scar and  viability, but it was felt he had potential for improvement in LV function with revascularization.  He was not a  candidate for percutaneous PCI due to vessel anatomy and high-risk coronary bypass surgery was recommended.  The patient had a Plavix washout  and optimization of his poor systolic function with milrinone preoperatively.  On 10/18, he underwent CABG x3 with simultaneous placement of a direct percutaneous left ventricular assist device, Impella 5.5.  He separated from cardiopulmonary bypass with good hemodynamics.  He did have a significant coagulopathy, but responded to  blood product transfusion.  The patient was in stable condition the next morning, but had a left pleural effusion of blood from the coagulopathy, which was drained with a chest tube.  He subsequently underwent extubation and had good progression over the  next 24 hours.  On the morning of the 3rd postoperative day, he had increased respiratory insufficiency, elevation in BUN and creatinine consistent with acute renal insufficiency and hyperkalemia.  Chest x-ray showed reaccumulation of pleural effusion.   Echocardiogram showed no significant pericardial effusion.  However, it was felt he probably had a component of cardiac tamponade resulting in his poor clinical course and was brought back to the operating room for reexploration.  The patient was found  to have moderate RV dysfunction and a fairly small anterior mediastinal thrombus, which was removed and bilateral pleural effusions, which were drained.  He was severely thrombocytopenic as a component of his postoperative coagulopathy.  The patient had  the chest closed in the OR with maintenance of hemodynamics and returned to the ICU.  Over the next 48 hours, the patient had evidence of low blood pressure from low peripheral vascular resistance, high cardiac output and elevated white count and lactic acid consistent with sepsis.  He was treated with broad spectrum antibiotics.  He had  elevated  LFTs and amylase consistent with mesenteric ischemia.  The patient continued to do poorly  despite support with inotropes, LVAD, and hemodialysis, which was started on the 3rd postoperative day for his hyperkalemia.  On the morning of the 6th  postoperative day, the patient's family felt that he was receiving support he would not wish consistent with his preoperative discussion with his family about goals of care.  At that point, the family requested comfort care only and his dialysis and  ventilator were discontinued and he was given pain medicine and peacefully expired.  POSTOPERATIVE DIAGNOSES: 1.  Acute systolic heart failure with respiratory distress. 2.  Non-ST elevation myocardial infarction. 3.  Acute pulmonary edema with respiratory distress requiring BiPAP. 4.  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 5.  History of alcohol and tobacco abuse.   6.  History of peripheral vascular disease, status post femoral-popliteal bypass. 7.  Postoperative coagulopathy and severe thrombocytopenia, platelet count 40,000. 8.  Postoperative acute organ failure with acute renal failure requiring hemodialysis. 9.  Postoperative vasodilatation with high cardiac output consistent with sepsis.  VN/NUANCE D:01/16/2020 T:01/16/2020 JOB:[id number]

## 2020-01-22 DEATH — deceased

## 2022-02-09 IMAGING — DX DG CHEST 1V PORT
1 series · 1 of 1 positions shown · non-contrast
Comparison: 12/05/2019

CLINICAL DATA: Dyspnea

EXAM:
PORTABLE CHEST 1 VIEW

[chest ap]
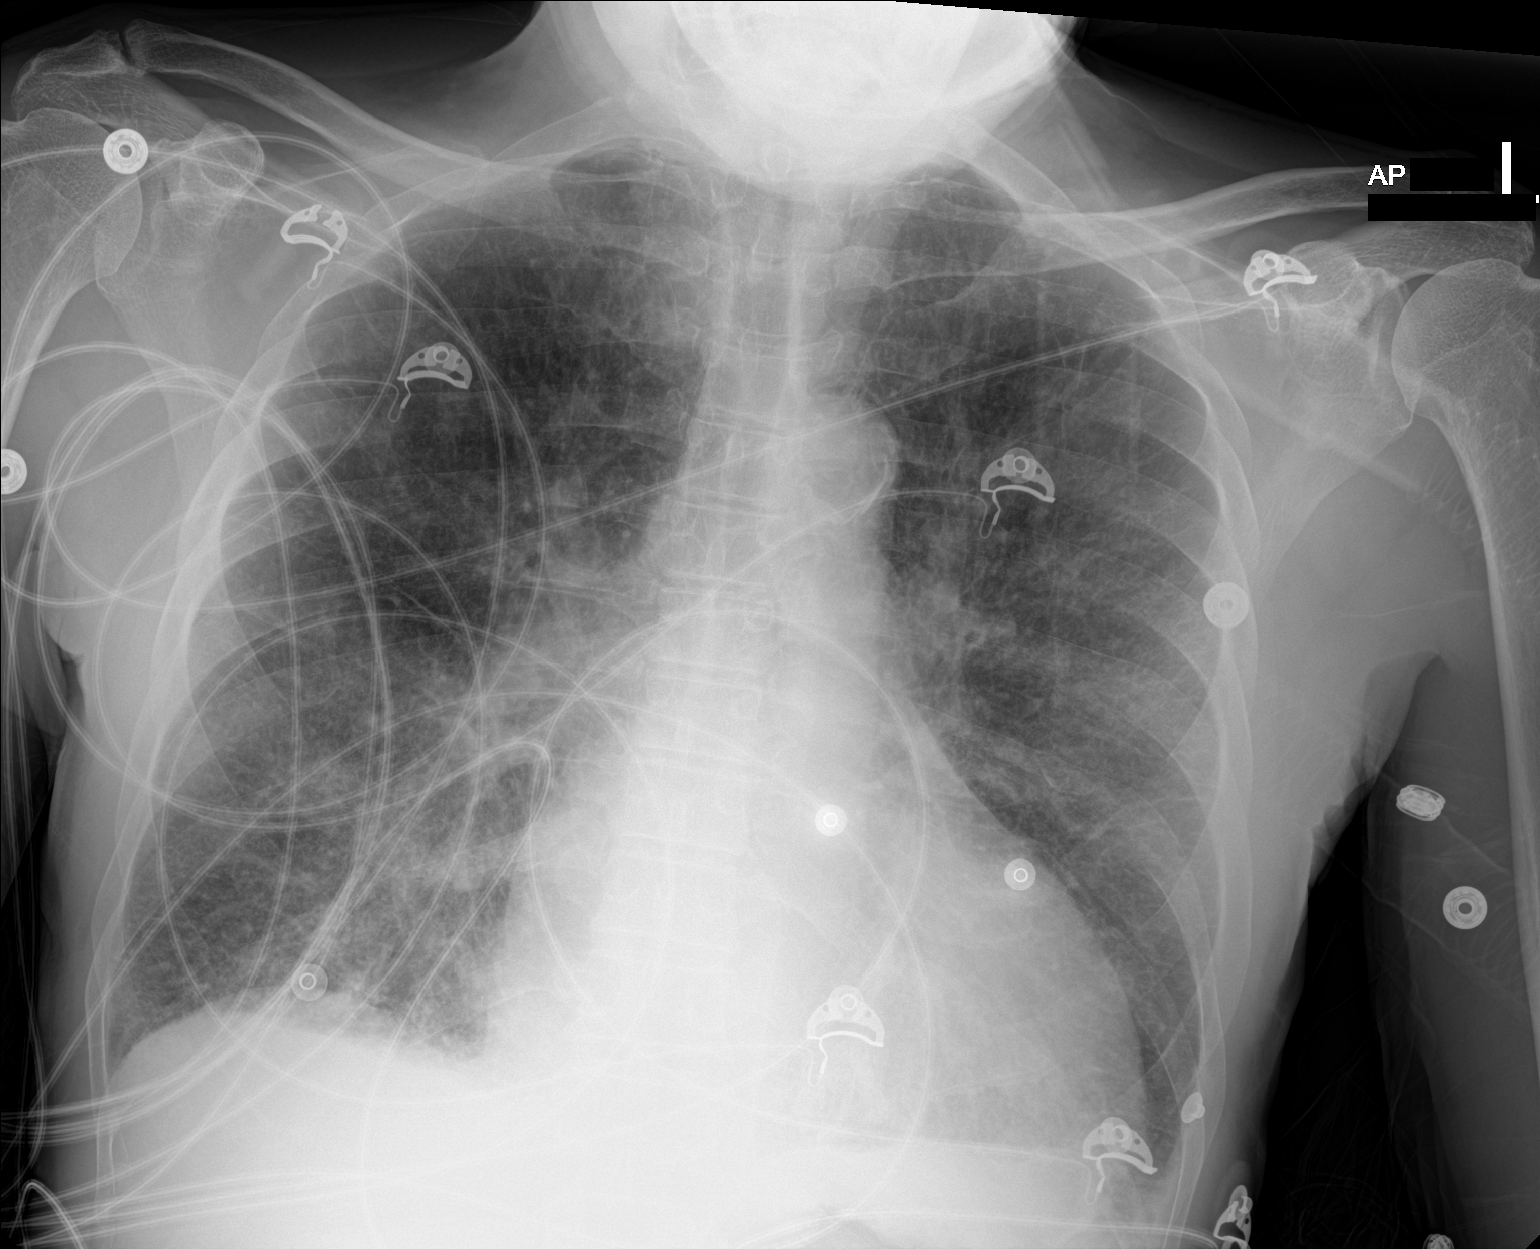

[1 of 1 positions shown; findings below may reference images not displayed]

FINDINGS: The lungs are symmetrically well expanded. Mild cardiomegaly is
again noted. There is interval development of mild bilateral mid and
lower lung zone airspace and interstitial infiltrate most in keeping
with mild pulmonary edema, likely cardiogenic in nature. No
pneumothorax or pleural effusion. No acute bone abnormality.
IMPRESSION: Interval development of mild cardiogenic failure.

## 2022-02-11 IMAGING — MR MR CARD MORPHOLOGY WO/W CM
45 of 48 series · 45 of 48 positions shown · IV contrast (gadavist)
Comparison: none

CLINICAL DATA: Ischemic Cardiomyopathy / Viability

EXAM:
CARDIAC MRI
TECHNIQUE: The patient was scanned on a 1.5 Tesla Siemens magnet. A dedicated
cardiac coil was used. Functional imaging was done using [employer]
sequences. [DATE], and 4 chamber views were done to assess for RWMA's.
Modified Illescas rule using a short axis stack was used to
calculate an ejection fraction on a dedicated work station using
Circ[employer] software. The patient received 8 cc of Gadavist. After 10
minutes inversion recovery sequences were used to assess for
infiltration and scar tissue.
CONTRAST:  Gadavist

[Series 4: t2_[id number]_bh · axial · 8.0mm · 1.41mm/px · 1 of 20 slices shown]
[im 1/20]
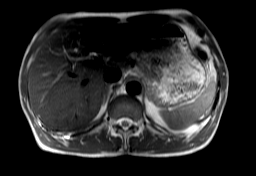

[Series 8: bSSFP · oblique · 8.0mm · 1.61mm/px · 1 of 25 slices shown (1 of 20)]
[im 1/25]
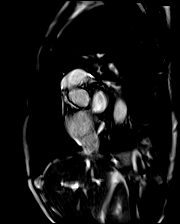

[Series 9: bSSFP · oblique · 8.0mm · 1.61mm/px · 1 of 25 slices shown (2 of 20)]
[im 1/25]
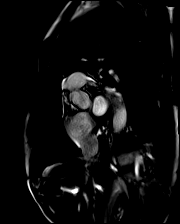

[Series 10: bSSFP · oblique · 8.0mm · 1.61mm/px · 1 of 25 slices shown (3 of 20)]
[im 1/25]
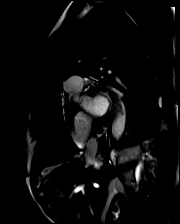

[Series 11: bSSFP · oblique · 8.0mm · 1.61mm/px · 1 of 25 slices shown (4 of 20)]
[im 1/25]
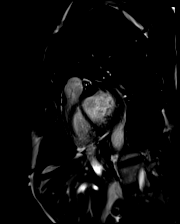

[Series 12: bSSFP · oblique · 8.0mm · 1.61mm/px · 1 of 25 slices shown (5 of 20)]
[im 1/25]
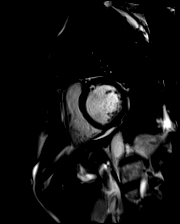

[Series 13: bSSFP · oblique · 8.0mm · 1.61mm/px · 1 of 25 slices shown (6 of 20)]
[im 1/25]
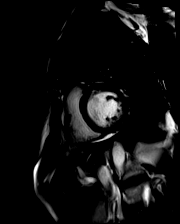

[Series 14: bSSFP · oblique · 8.0mm · 1.61mm/px · 1 of 25 slices shown (7 of 20)]
[im 1/25]
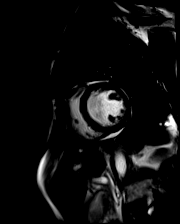

[Series 15: bSSFP · oblique · 8.0mm · 1.61mm/px · 1 of 25 slices shown (8 of 20)]
[im 1/25]
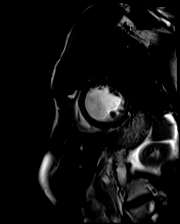

[Series 16: bSSFP · oblique · 8.0mm · 1.61mm/px · 1 of 25 slices shown (9 of 20)]
[im 1/25]
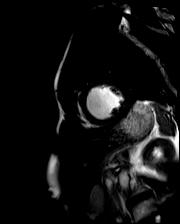

[Series 17: bSSFP · oblique · 8.0mm · 1.61mm/px · 1 of 25 slices shown (10 of 20)]
[im 1/25]
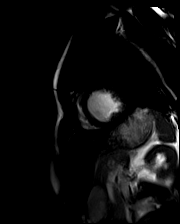

[Series 18: bSSFP · oblique · 8.0mm · 1.61mm/px · 1 of 25 slices shown (11 of 20)]
[im 1/25]
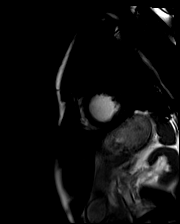

[Series 19: bSSFP · oblique · 8.0mm · 1.61mm/px · 1 of 25 slices shown (12 of 20)]
[im 1/25]
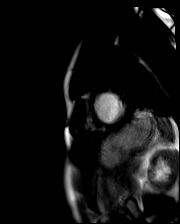

[Series 20: bSSFP · oblique · 8.0mm · 1.61mm/px · 1 of 25 slices shown (13 of 20)]
[im 1/25]
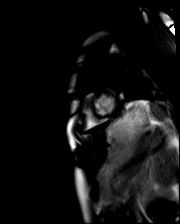

[Series 21: bSSFP · oblique · 8.0mm · 1.61mm/px · 1 of 25 slices shown (14 of 20)]
[im 1/25]
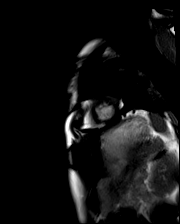

[Series 22: bSSFP · oblique · 8.0mm · 1.61mm/px · 1 of 25 slices shown (15 of 20)]
[im 1/25]
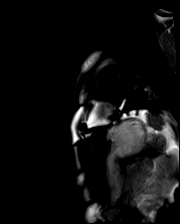

[Series 23: bSSFP · oblique · 8.0mm · 1.61mm/px · 1 of 25 slices shown (16 of 20)]
[im 1/25]
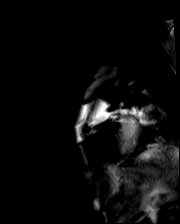

[Series 24: bSSFP · oblique · 8.0mm · 1.61mm/px · 1 of 25 slices shown (17 of 20)]
[im 1/25]
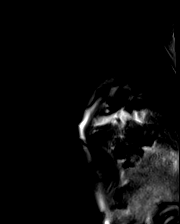

[Series 25: bSSFP · oblique · 6.0mm · 1.41mm/px · 1 of 25 slices shown (18 of 20)]
[im 1/25]
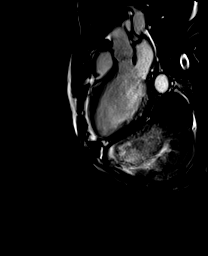

[Series 26: bSSFP · oblique · 6.0mm · 1.41mm/px · 1 of 25 slices shown (19 of 20)]
[im 1/25]
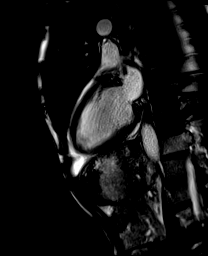

[Series 27: bSSFP · axial · 6.0mm · 1.41mm/px · 1 of 25 slices shown (20 of 20)]
[im 1/25]
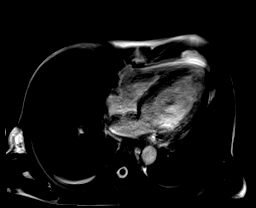

[Series 28: (id)_long_t1 · oblique · 8.0mm · 1.56mm/px · 1 of 24 slices shown]
[im 1/24]
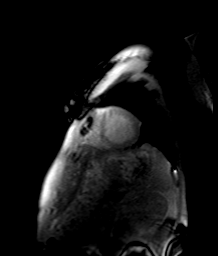

[Series 29: (id)_long_t1_moco · oblique · 8.0mm · 1.56mm/px · 1 of 24 slices shown]
[im 1/24]
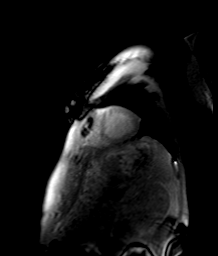

[Series 30: (id)_long_t1_moco_t1 · oblique · 8.0mm · 1.56mm/px · 1 of 6 slices shown]
[im 1/6]
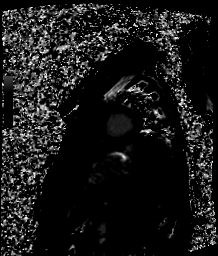

[Series 32: (id)_trufi · oblique · 8.0mm · 2.08mm/px · 1 of 9 slices shown]
[im 1/9]
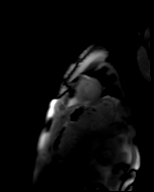

[Series 33: (id)_trufi_moco · oblique · 8.0mm · 2.08mm/px · 1 of 9 slices shown]
[im 1/9]
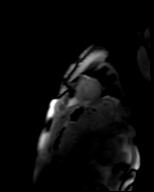

[Series 34: (id)_trufi_moco_t2 · oblique · 8.0mm · 2.08mm/px · 1 of 4 slices shown]
[im 1/4]
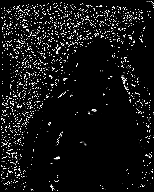

[Series 36: cine_trufi_cs_rt_short axis · oblique · 8.0mm · 1.73mm/px · 1 of 49 slices shown (1 of 18)]
[im 1/49]
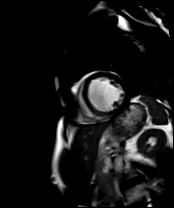

[Series 36: cine_trufi_cs_rt_short axis · oblique · 8.0mm · 1.73mm/px · 1 of 49 slices shown (2 of 18)]
[im 1/49]
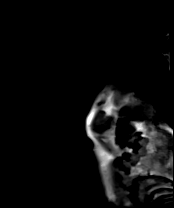

[Series 36: cine_trufi_cs_rt_short axis · oblique · 8.0mm · 1.73mm/px · 1 of 49 slices shown (3 of 18)]
[im 1/49]
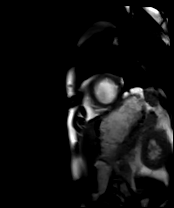

[Series 36: cine_trufi_cs_rt_short axis · oblique · 8.0mm · 1.73mm/px · 1 of 49 slices shown (4 of 18)]
[im 1/49]
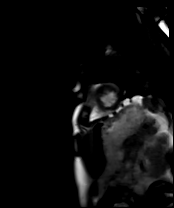

[Series 36: cine_trufi_cs_rt_short axis · oblique · 8.0mm · 1.73mm/px · 1 of 49 slices shown (5 of 18)]
[im 1/49]
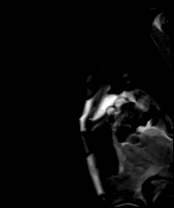

[Series 36: cine_trufi_cs_rt_short axis · oblique · 8.0mm · 1.73mm/px · 1 of 49 slices shown (6 of 18)]
[im 1/49]
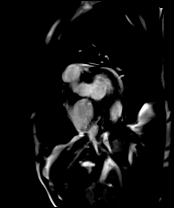

[Series 36: cine_trufi_cs_rt_short axis · oblique · 8.0mm · 1.73mm/px · 1 of 49 slices shown (7 of 18)]
[im 1/49]
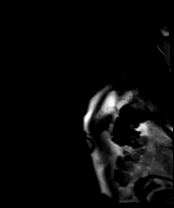

[Series 36: cine_trufi_cs_rt_short axis · oblique · 8.0mm · 1.73mm/px · 1 of 49 slices shown (8 of 18)]
[im 1/49]
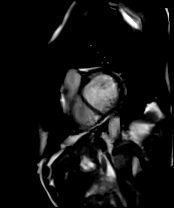

[Series 36: cine_trufi_cs_rt_short axis · oblique · 8.0mm · 1.73mm/px · 1 of 49 slices shown (9 of 18)]
[im 1/49]
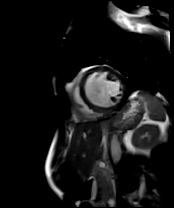

[Series 36: cine_trufi_cs_rt_short axis · oblique · 8.0mm · 1.73mm/px · 1 of 49 slices shown (10 of 18)]
[im 1/49]
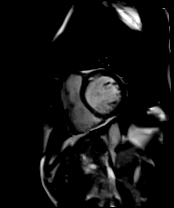

[Series 36: cine_trufi_cs_rt_short axis · oblique · 8.0mm · 1.73mm/px · 1 of 49 slices shown (11 of 18)]
[im 1/49]
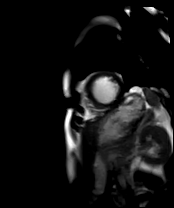

[Series 36: cine_trufi_cs_rt_short axis · oblique · 8.0mm · 1.73mm/px · 1 of 49 slices shown (12 of 18)]
[im 1/49]
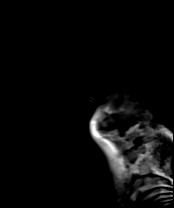

[Series 36: cine_trufi_cs_rt_short axis · oblique · 8.0mm · 1.73mm/px · 1 of 49 slices shown (13 of 18)]
[im 1/49]
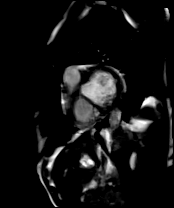

[Series 36: cine_trufi_cs_rt_short axis · oblique · 8.0mm · 1.73mm/px · 1 of 49 slices shown (14 of 18)]
[im 1/49]
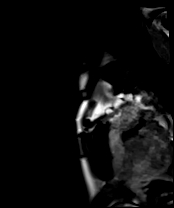

[Series 36: cine_trufi_cs_rt_short axis · oblique · 8.0mm · 1.73mm/px · 1 of 49 slices shown (15 of 18)]
[im 1/49]
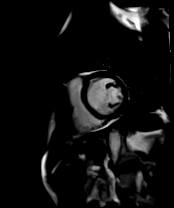

[Series 36: cine_trufi_cs_rt_short axis · oblique · 8.0mm · 1.73mm/px · 1 of 49 slices shown (16 of 18)]
[im 1/49]
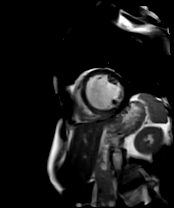

[Series 36: cine_trufi_cs_rt_short axis · oblique · 8.0mm · 1.73mm/px · 1 of 49 slices shown (17 of 18)]
[im 1/49]
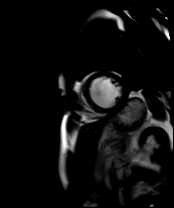

[Series 36: cine_trufi_cs_rt_short axis · oblique · 8.0mm · 1.73mm/px · 1 of 49 slices shown (18 of 18)]
[im 1/49]
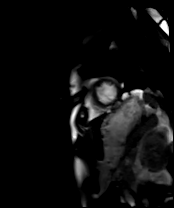

[45 of 48 positions shown; findings below may reference images not displayed]

FINDINGS: Mild LAE. Normal RA/RV size. Normal RV function. No ASD/PFO Normal
aortic root 2.8 cm. Mild appearing MR. AV tri leaflet with some
turbulent flow suggesting sclerosis but not stenosis There is a
small pericardial effusion. There is moderate LVE with diffuse
hypokinesis worse in the septum and apex. Quantitative EF 25% (EDV
154 cc ESV 114 cc SV 40 cc) Delayed enhancement images show
subendocardial scar in the mid/distal anterior wall, entire septum
and apex. There is no mural apical thrombus
IMPRESSION: 1. Moderate LVE with diffuse hypokinesis worse in the septum and
apex EF 26%

2. Post gadolinium images with sub-endocardial scar in the mid
distal anterior wall, septum and apex. No transmural or full
thickness scar suggesting possibility

Of LVEF recovery with revascularization

3.  Small pericardial effusion

4.  Mild appearing MR

5.  AV sclerosis

6.  Mild HNR-PROJECT

[employer]
